# Patient Record
Sex: Male | Born: 1960 | Race: Black or African American | Hispanic: No | Marital: Single | Smoking: Former smoker
Health system: Southern US, Community
[De-identification: ages and names within clinical notes are randomized; demographics above are authoritative.]

## PROBLEM LIST (undated history)

## (undated) DIAGNOSIS — E119 Type 2 diabetes mellitus without complications: Secondary | ICD-10-CM

## (undated) DIAGNOSIS — M1990 Unspecified osteoarthritis, unspecified site: Secondary | ICD-10-CM

## (undated) DIAGNOSIS — E669 Obesity, unspecified: Secondary | ICD-10-CM

## (undated) DIAGNOSIS — T7840XA Allergy, unspecified, initial encounter: Secondary | ICD-10-CM

## (undated) DIAGNOSIS — K635 Polyp of colon: Secondary | ICD-10-CM

## (undated) DIAGNOSIS — D649 Anemia, unspecified: Secondary | ICD-10-CM

## (undated) DIAGNOSIS — I10 Essential (primary) hypertension: Secondary | ICD-10-CM

## (undated) DIAGNOSIS — J45909 Unspecified asthma, uncomplicated: Secondary | ICD-10-CM

## (undated) HISTORY — PX: KNEE ARTHROSCOPY: SUR90

## (undated) HISTORY — DX: Allergy, unspecified, initial encounter: T78.40XA

## (undated) HISTORY — DX: Obesity, unspecified: E66.9

## (undated) HISTORY — DX: Anemia, unspecified: D64.9

## (undated) HISTORY — DX: Unspecified osteoarthritis, unspecified site: M19.90

## (undated) HISTORY — DX: Unspecified asthma, uncomplicated: J45.909

## (undated) HISTORY — DX: Polyp of colon: K63.5

---

## 1998-08-26 ENCOUNTER — Encounter: Admission: RE | Admit: 1998-08-26 | Discharge: 1998-08-26 | Payer: Self-pay | Admitting: *Deleted

## 2004-05-19 ENCOUNTER — Encounter: Admission: RE | Admit: 2004-05-19 | Discharge: 2004-05-19 | Payer: Self-pay | Admitting: Family Medicine

## 2011-12-31 ENCOUNTER — Ambulatory Visit (INDEPENDENT_AMBULATORY_CARE_PROVIDER_SITE_OTHER): Payer: Managed Care, Other (non HMO) | Admitting: Internal Medicine

## 2011-12-31 ENCOUNTER — Ambulatory Visit: Payer: Managed Care, Other (non HMO)

## 2011-12-31 ENCOUNTER — Telehealth: Payer: Self-pay | Admitting: Internal Medicine

## 2011-12-31 VITALS — BP 167/107 | HR 75 | Temp 98.1°F | Resp 16 | Ht 68.0 in | Wt 207.4 lb

## 2011-12-31 DIAGNOSIS — M79646 Pain in unspecified finger(s): Secondary | ICD-10-CM

## 2011-12-31 DIAGNOSIS — M25562 Pain in left knee: Secondary | ICD-10-CM

## 2011-12-31 DIAGNOSIS — I10 Essential (primary) hypertension: Secondary | ICD-10-CM

## 2011-12-31 DIAGNOSIS — M79609 Pain in unspecified limb: Secondary | ICD-10-CM

## 2011-12-31 DIAGNOSIS — M25569 Pain in unspecified knee: Secondary | ICD-10-CM

## 2011-12-31 MED ORDER — HYDROCHLOROTHIAZIDE 12.5 MG PO CAPS: 12.5000 mg | ORAL_CAPSULE | Freq: Every day | ORAL | Status: DC

## 2011-12-31 MED ORDER — MELOXICAM 15 MG PO TABS: 15.0000 mg | ORAL_TABLET | Freq: Every day | ORAL | Status: DC

## 2011-12-31 MED ORDER — MELOXICAM 15 MG PO TABS: 15.0000 mg | ORAL_TABLET | Freq: Every day | ORAL | Status: AC

## 2011-12-31 NOTE — Telephone Encounter (Signed)
What is his phone #?
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
## 2012-01-05 ENCOUNTER — Telehealth: Payer: Self-pay

## 2012-01-05 NOTE — Telephone Encounter (Signed)
X-rays copied on a disc for pick up. Patient notified.

## 2012-01-05 NOTE — Telephone Encounter (Signed)
Pt needs copy of x-rays on cd to take to his referral appt on 01-07-12 @ 3:30 please contact pt when ready for pick-up.Marland Kitchen

## 2012-02-01 ENCOUNTER — Encounter: Payer: Managed Care, Other (non HMO) | Admitting: Family Medicine

## 2013-03-11 ENCOUNTER — Ambulatory Visit (INDEPENDENT_AMBULATORY_CARE_PROVIDER_SITE_OTHER): Payer: Managed Care, Other (non HMO) | Admitting: Physician Assistant

## 2013-03-11 VITALS — BP 142/86 | HR 87 | Temp 98.7°F | Resp 18 | Ht 67.0 in | Wt 205.0 lb

## 2013-03-11 DIAGNOSIS — R21 Rash and other nonspecific skin eruption: Secondary | ICD-10-CM

## 2013-03-11 DIAGNOSIS — L282 Other prurigo: Secondary | ICD-10-CM

## 2013-03-11 DIAGNOSIS — Z131 Encounter for screening for diabetes mellitus: Secondary | ICD-10-CM

## 2013-03-11 LAB — GLUCOSE, POCT (MANUAL RESULT ENTRY): POC Glucose: 90 mg/dl (ref 70–99)

## 2013-03-11 MED ORDER — PREDNISONE 20 MG PO TABS: ORAL_TABLET | ORAL | Status: DC

## 2013-03-11 NOTE — Patient Instructions (Signed)
Continue OTC hydrocortisone cream twice daily to affected area Recommend Zyrtec daily in the morning and Benadryl 25-50 mg at bedtime Start prednisone taper - take in a.m.  Follow up if symptoms worsen or fail to improve.

## 2013-03-11 NOTE — Progress Notes (Signed)
  Subjective:    Patient ID: Craig House, male    DOB: 17-Jan-1961, 52 y.o.   MRN: [id number]  HPI 52 year old male presents with 1 week history of pruritic rash on face and back of neck.  He has also noticed the appearance of several small dots on his forearms and hands.  Denies any recent yard work or known exposure to poison ivy.  Was in a hotel last week and used a different soap.  No SOB, lip/tongue swelling, pain, or headache.  Admits it is extremely pruritic and has started weeping.  He has used calamine lotion and hydrocortisone cream which have helped some.  No antihistamines tried yet.  Does have a dog that could have gotten into poison ivy and spread to him.       Review of Systems  Constitutional: Negative for fever and chills.  Eyes: Negative for photophobia, pain and visual disturbance.  Gastrointestinal: Negative for nausea and vomiting.  Musculoskeletal: Negative for myalgias.  Skin: Positive for rash.  Neurological: Negative for dizziness and headaches.       Objective:   Physical Exam  Constitutional: He is oriented to person, place, and time. He appears well-developed and well-nourished.  HENT:  Head: Normocephalic and atraumatic.  Right Ear: External ear normal.  Left Ear: External ear normal.  Mouth/Throat: Oropharynx is clear and moist.  Eyes: Conjunctivae are normal.  Neck: Normal range of motion.  Cardiovascular: Normal rate.   Pulmonary/Chest: Effort normal.  Neurological: He is alert and oriented to person, place, and time.  Skin:     Psychiatric: He has a normal mood and affect. His behavior is normal. Judgment and thought content normal.          Assessment & Plan:  Rash and other nonspecific skin eruption - Plan: predniSONE (DELTASONE) 20 MG tablet  Pruritic rash - Plan: predniSONE (DELTASONE) 20 MG tablet  Screening for diabetes mellitus - Plan: POCT glucose (manual entry)  Patient Instructions  Continue OTC hydrocortisone cream  twice daily to affected area Recommend Zyrtec daily in the morning and Benadryl 25-50 mg at bedtime Start prednisone taper - take in a.m.  Follow up if symptoms worsen or fail to improve.

## 2013-03-14 ENCOUNTER — Telehealth: Payer: Self-pay

## 2013-03-14 DIAGNOSIS — I10 Essential (primary) hypertension: Secondary | ICD-10-CM

## 2013-03-14 MED ORDER — HYDROCHLOROTHIAZIDE 12.5 MG PO CAPS: 12.5000 mg | ORAL_CAPSULE | Freq: Every day | ORAL | Status: DC

## 2013-03-14 NOTE — Telephone Encounter (Signed)
I have sent a 30 day supply, pt needs CPE, labs

## 2013-03-14 NOTE — Telephone Encounter (Signed)
Pt is out of bp rx - seen here recently for an allergic rxn and states he asked for a refill but wasn't given one. Pt is out. Please notify ok to refill or not.   Pharmacy: [facility] [address]  [place]

## 2013-03-15 NOTE — Telephone Encounter (Signed)
Called him to advise follow up needed

## 2013-08-01 ENCOUNTER — Ambulatory Visit (INDEPENDENT_AMBULATORY_CARE_PROVIDER_SITE_OTHER): Payer: Managed Care, Other (non HMO) | Admitting: Emergency Medicine

## 2013-08-01 VITALS — BP 150/90 | HR 76 | Temp 98.2°F | Resp 16 | Ht 67.0 in | Wt 219.0 lb

## 2013-08-01 DIAGNOSIS — I10 Essential (primary) hypertension: Secondary | ICD-10-CM

## 2013-08-01 MED ORDER — TRIAMTERENE-HCTZ 37.5-25 MG PO CAPS: 1.0000 | ORAL_CAPSULE | ORAL | Status: DC

## 2013-08-01 NOTE — Progress Notes (Signed)
[facility] [address], [place] [address] [phone]  Date:  08/01/2013   Name:  Craig House   DOB:  [birth year]   MRN:  [id number]  PCP:  No PCP Per Patient    Chief Complaint: Medication Refill   History of Present Illness:  Djuan Talton is a 52 y.o. very pleasant male patient who presents with the following:  History of hypertension and is out of his antihypertensive.  Requests refill and a potassium check.  Requests "diet pills". No improvement with over the counter medications or other home remedies. Denies other complaint or health concern today.   Patient Active Problem List   Diagnosis Date Noted  . HTN (hypertension) 12/31/2011    Past Medical History  Diagnosis Date  . Asthma     Past Surgical History  Procedure Laterality Date  . Knee surgery      History  Substance Use Topics  . Smoking status: Former Games developer  . Smokeless tobacco: Not on file  . Alcohol Use: Not on file    Family History  Problem Relation Age of Onset  . Heart attack Mother   . Cancer Father     No Known Allergies  Medication list has been reviewed and updated.  Current Outpatient Prescriptions on File Prior to Visit  Medication Sig Dispense Refill  . hydrochlorothiazide (MICROZIDE) 12.5 MG capsule Take 1 capsule (12.5 mg total) by mouth daily.  30 capsule  0   No current facility-administered medications on file prior to visit.    Review of Systems:  As per HPI, otherwise negative.    Physical Examination: Filed Vitals:   08/01/13 1836  BP: 150/90  Pulse: 76  Temp: 98.2 F (36.8 C)  Resp: 16   Filed Vitals:   08/01/13 1836  Height: 5\' 7"  (1.702 m)  Weight: 219 lb (99.338 kg)   Body mass index is 34.29 kg/(m^2). Ideal Body Weight: Weight in (lb) to have BMI = 25: 159.3  GEN: WDWN, NAD, Non-toxic, A & O x 3 HEENT: Atraumatic, Normocephalic. Neck supple. No masses, No LAD. Ears and Nose: No external deformity. CV: RRR,  No M/G/R. No JVD. No thrill. No extra heart sounds. PULM: CTA B, no wheezes, crackles, rhonchi. No retractions. No resp. distress. No accessory muscle use. ABD: S, NT, ND, +BS. No rebound. No HSM. EXTR: No c/c/e NEURO Normal gait.  PSYCH: Normally interactive. Conversant. Not depressed or anxious appearing.  Calm demeanor.    Assessment and Plan: Hypertension Overweight Concerned about potassium level Will change to dyazide and follow up in one month.   Signed,  Phillips Odor, MD

## 2013-08-01 NOTE — Patient Instructions (Addendum)

## 2013-08-06 ENCOUNTER — Ambulatory Visit: Payer: Managed Care, Other (non HMO)

## 2013-08-06 ENCOUNTER — Emergency Department (HOSPITAL_COMMUNITY)
Admission: EM | Admit: 2013-08-06 | Discharge: 2013-08-06 | Disposition: A | Discharge status: Home / Self Care | Payer: Managed Care, Other (non HMO) | Attending: Emergency Medicine | Admitting: Emergency Medicine

## 2013-08-06 ENCOUNTER — Encounter (HOSPITAL_COMMUNITY): Payer: Self-pay | Admitting: *Deleted

## 2013-08-06 ENCOUNTER — Ambulatory Visit (INDEPENDENT_AMBULATORY_CARE_PROVIDER_SITE_OTHER): Payer: Managed Care, Other (non HMO) | Admitting: Internal Medicine

## 2013-08-06 ENCOUNTER — Emergency Department (HOSPITAL_COMMUNITY): Payer: Managed Care, Other (non HMO)

## 2013-08-06 VITALS — BP 116/78 | HR 104 | Temp 98.3°F | Resp 20 | Ht 67.5 in | Wt 207.0 lb

## 2013-08-06 DIAGNOSIS — T50905A Adverse effect of unspecified drugs, medicaments and biological substances, initial encounter: Secondary | ICD-10-CM

## 2013-08-06 DIAGNOSIS — E785 Hyperlipidemia, unspecified: Secondary | ICD-10-CM

## 2013-08-06 DIAGNOSIS — Z79899 Other long term (current) drug therapy: Secondary | ICD-10-CM | POA: Insufficient documentation

## 2013-08-06 DIAGNOSIS — R5381 Other malaise: Secondary | ICD-10-CM

## 2013-08-06 DIAGNOSIS — R5383 Other fatigue: Secondary | ICD-10-CM

## 2013-08-06 DIAGNOSIS — I248 Other forms of acute ischemic heart disease: Secondary | ICD-10-CM

## 2013-08-06 DIAGNOSIS — I2489 Other forms of acute ischemic heart disease: Secondary | ICD-10-CM

## 2013-08-06 DIAGNOSIS — J029 Acute pharyngitis, unspecified: Secondary | ICD-10-CM | POA: Insufficient documentation

## 2013-08-06 DIAGNOSIS — R002 Palpitations: Secondary | ICD-10-CM

## 2013-08-06 DIAGNOSIS — Z87891 Personal history of nicotine dependence: Secondary | ICD-10-CM | POA: Insufficient documentation

## 2013-08-06 DIAGNOSIS — I10 Essential (primary) hypertension: Secondary | ICD-10-CM

## 2013-08-06 DIAGNOSIS — R110 Nausea: Secondary | ICD-10-CM | POA: Insufficient documentation

## 2013-08-06 DIAGNOSIS — J45909 Unspecified asthma, uncomplicated: Secondary | ICD-10-CM | POA: Insufficient documentation

## 2013-08-06 DIAGNOSIS — R42 Dizziness and giddiness: Secondary | ICD-10-CM | POA: Insufficient documentation

## 2013-08-06 HISTORY — DX: Essential (primary) hypertension: I10

## 2013-08-06 LAB — BASIC METABOLIC PANEL
BUN: 19 mg/dL (ref 6–23)
BUN: 16 mg/dL (ref 6–23)
Creatinine, Ser: 1.43 mg/dL — ABNORMAL HIGH (ref 0.50–1.35)
Creatinine, Ser: 1.21 mg/dL (ref 0.50–1.35)
GFR calc Af Amer: 63 mL/min — ABNORMAL LOW (ref >90)
GFR calc Af Amer: 77 mL/min — ABNORMAL LOW (ref >90)
GFR calc non Af Amer: 67 mL/min — ABNORMAL LOW (ref >90)
Glucose, Bld: 192 mg/dL — ABNORMAL HIGH (ref 70–99)
Glucose, Bld: 166 mg/dL — ABNORMAL HIGH (ref 70–99)
Potassium: 5 mEq/L (ref 3.5–5.1)

## 2013-08-06 LAB — POCT UA - MICROSCOPIC ONLY: Yeast, UA: NEGATIVE

## 2013-08-06 LAB — POCT I-STAT, CHEM 8
BUN: 16 mg/dL (ref 6–23)
Calcium, Ion: 1.08 mmol/L — ABNORMAL LOW (ref 1.12–1.23)
Chloride: 98 meq/L (ref 96–112)
Creatinine, Ser: 1.3 mg/dL (ref 0.50–1.35)
Glucose, Bld: 164 mg/dL — ABNORMAL HIGH (ref 70–99)
HCT: 51 % (ref 39.0–52.0)
Hemoglobin: 17.3 g/dL — ABNORMAL HIGH (ref 13.0–17.0)
Potassium: 4 meq/L (ref 3.5–5.1)
Sodium: 136 mEq/L (ref 135–145)
TCO2: 27 mmol/L (ref 0–100)

## 2013-08-06 LAB — CBC WITH DIFFERENTIAL/PLATELET
Basophils Absolute: 0 10*3/uL (ref 0.0–0.1)
Basophils Relative: 0 % (ref 0–1)
Eosinophils Absolute: 0.1 10*3/uL (ref 0.0–0.7)
Eosinophils Relative: 1 % (ref 0–5)
HCT: 47.4 % (ref 39.0–52.0)
Hemoglobin: 16.9 g/dL (ref 13.0–17.0)
Lymphocytes Relative: 31 % (ref 12–46)
Lymphs Abs: 3.7 K/uL (ref 0.7–4.0)
MCH: 32.8 pg (ref 26.0–34.0)
MCHC: 35.7 g/dL (ref 30.0–36.0)
MCV: 91.9 fL (ref 78.0–100.0)
Monocytes Absolute: 1.1 10*3/uL — ABNORMAL HIGH (ref 0.1–1.0)
Monocytes Relative: 9 % (ref 3–12)
Neutro Abs: 6.9 10*3/uL (ref 1.7–7.7)
Neutrophils Relative %: 59 % (ref 43–77)
Platelets: 285 K/uL (ref 150–400)
RBC: 5.16 MIL/uL (ref 4.22–5.81)
RDW: 12.6 % (ref 11.5–15.5)
WBC: 11.8 10*3/uL — ABNORMAL HIGH (ref 4.0–10.5)

## 2013-08-06 LAB — POCT URINALYSIS DIPSTICK
Blood, UA: NEGATIVE
Glucose, UA: NEGATIVE
Ketones, UA: NEGATIVE
Spec Grav, UA: >=1.03
Urobilinogen, UA: 1

## 2013-08-06 LAB — URINALYSIS, ROUTINE W REFLEX MICROSCOPIC
Bilirubin Urine: NEGATIVE
Glucose, UA: NEGATIVE mg/dL
Ketones, ur: NEGATIVE mg/dL
Leukocytes, UA: NEGATIVE
Protein, ur: NEGATIVE mg/dL
Specific Gravity, Urine: 1.021 (ref 1.005–1.030)
Urobilinogen, UA: 1 mg/dL (ref 0.0–1.0)
pH: 5.5 (ref 5.0–8.0)

## 2013-08-06 LAB — POCT I-STAT TROPONIN I
Troponin i, poc: 0.01 ng/mL (ref 0.00–0.08)
Troponin i, poc: 0.02 ng/mL (ref 0.00–0.08)

## 2013-08-06 MED ORDER — ONDANSETRON HCL 4 MG/2ML IJ SOLN: 4.0000 mg | Freq: Once | INTRAMUSCULAR | Status: DC

## 2013-08-06 MED ORDER — SODIUM CHLORIDE 0.9 % IV BOLUS (SEPSIS)
1000.0000 mL | Freq: Once | INTRAVENOUS | Status: AC
  Administered 2013-08-06: 1000 mL via INTRAVENOUS

## 2013-08-06 NOTE — ED Notes (Signed)
Given urinal and explained a specimen would be needed. Verbalized understanding.

## 2013-08-06 NOTE — ED Provider Notes (Signed)
CSN: [id number]     Arrival date & time 08/06/13  1314 History   First MD Initiated Contact with Patient 08/06/13 1317     Chief Complaint  Patient presents with  . Weakness   (Consider location/radiation/quality/duration/timing/severity/associated sxs/prior Treatment) HPI 52 year old male with a past medical history of hypertension and asthma that presents after transfer from his primary care doctor's office for further evaluation of possible atypical ACS symptoms.  The patient reports 2 days of malaise, fatigue, nausea, lightheadedness and overall not feeling well. He also had 2 episodes of "sweating" yesterday. He was seen at his doctor's office today and had new T wave inversions on his EKG in the setting of the vague symptoms so he was referred to the emergency department. He has never had any chest pain during this episode. He denies cough, fevers, vomiting, abdominal pain, diarrhea, weight loss, urinary symptoms. No known sick contacts or travel.  His mother reportedly died from a "cardiac arrest" but he is unsure of the cause.  Past Medical History  Diagnosis Date  . Asthma   . Hypertension    Past Surgical History  Procedure Laterality Date  . Knee surgery     Family History  Problem Relation Age of Onset  . Heart attack Mother   . Cancer Father    History  Substance Use Topics  . Smoking status: Former Games developer  . Smokeless tobacco: Not on file  . Alcohol Use: 0.5 oz/week    1 drink(s) per week    Review of Systems  Constitutional: Negative for fever, chills and unexpected weight change.  HENT: Positive for sore throat. Negative for congestion, trouble swallowing, neck stiffness and voice change.   Eyes: Negative for pain.  Respiratory: Negative for cough, chest tightness and shortness of breath.   Cardiovascular: Negative for chest pain, palpitations and leg swelling.  Endocrine: Negative for polydipsia and polyphagia.  Genitourinary: Negative for dysuria and  frequency.  Musculoskeletal: Negative for back pain, joint swelling, arthralgias and gait problem.  Skin: Negative for pallor and rash.  Neurological: Negative for syncope, weakness and headaches.  Hematological: Negative for adenopathy. Does not bruise/bleed easily.  All other systems reviewed and are negative.    Allergies  Review of patient's allergies indicates no known allergies.  Home Medications   Current Outpatient Rx  Name  Route  Sig  Dispense  Refill  . naproxen sodium (ANAPROX) 220 MG tablet   Oral   Take 440 mg by mouth 2 (two) times daily as needed.         . triamterene-hydrochlorothiazide (DYAZIDE) 37.5-25 MG per capsule   Oral   Take 1 each (1 capsule total) by mouth every morning.   30 capsule   3    BP 140/100  Pulse 101  Temp(Src) 98.6 F (37 C) (Oral)  Resp 18  SpO2 98% Physical Exam  Vitals reviewed. Constitutional: He is oriented to person, place, and time. He appears well-developed and well-nourished. No distress.  HENT:  Head: Normocephalic.  Right Ear: External ear normal.  Left Ear: External ear normal.  Nose: Nose normal.  Mouth/Throat: Oropharynx is clear and moist. No oropharyngeal exudate.  Eyes: Conjunctivae and EOM are normal. Pupils are equal, round, and reactive to light.  Neck: Normal range of motion. Neck supple.  Cardiovascular: Regular rhythm, normal heart sounds and intact distal pulses.  Exam reveals no gallop and no friction rub.   No murmur heard. Borderline tachycardia  Pulmonary/Chest: Effort normal and breath sounds normal.  Abdominal: Soft. Bowel sounds are normal. He exhibits no distension. There is no tenderness.  Musculoskeletal: Normal range of motion. He exhibits no edema and no tenderness.  Neurological: He is alert and oriented to person, place, and time.  Skin: Skin is warm and dry. He is not diaphoretic.  Psychiatric: He has a normal mood and affect.    ED Course  Procedures (including critical care  time) Labs Review Labs Reviewed  CBC WITH DIFFERENTIAL - Abnormal; Notable for the following:    WBC 11.8 (*)    Monocytes Absolute 1.1 (*)    All other components within normal limits  BASIC METABOLIC PANEL - Abnormal; Notable for the following:    Sodium 129 (*)    Chloride 93 (*)    Glucose, Bld 192 (*)    Creatinine, Ser 1.43 (*)    GFR calc non Af Amer 55 (*)    GFR calc Af Amer 63 (*)    All other components within normal limits  BASIC METABOLIC PANEL - Abnormal; Notable for the following:    Glucose, Bld 166 (*)    Calcium 8.3 (*)    GFR calc non Af Amer 67 (*)    GFR calc Af Amer 77 (*)    All other components within normal limits  POCT I-STAT, CHEM 8 - Abnormal; Notable for the following:    Glucose, Bld 164 (*)    Calcium, Ion 1.08 (*)    Hemoglobin 17.3 (*)    All other components within normal limits  URINALYSIS, ROUTINE W REFLEX MICROSCOPIC  TROPONIN I  POCT I-STAT TROPONIN I  POCT I-STAT TROPONIN I   Imaging Review Dg Chest 2 View  08/06/2013   CLINICAL DATA:  Nausea, weakness  EXAM: CHEST  2 VIEW  COMPARISON:  None.  FINDINGS: There is no focal infiltrate, pulmonary edema, or pleural effusion. The mediastinal contour and cardiac silhouette are normal. The soft tissues and osseous structures are normal.  IMPRESSION: No active cardiopulmonary disease.   Electronically Signed   By: Sherian Rein   On: 08/06/2013 17:17     Date: 08/06/2013  Rate: 101  Rhythm: sinus tachycardia  QRS Axis: normal  Intervals: normal  ST/T Wave abnormalities: T wave inversions in lead 1, lead 2, V5 and V6  Conduction Disutrbances:none  Narrative Interpretation: Sinus tach, NSTWA, otherwise normal  Old EKG Reviewed: New T wave inversions in V5 and V6 compared to his EKG from 04/06/2005, rate has increased by 27 beats, otherwise no change     MDM   52 year old male with pertinent history of hypertension and asthma here with 2 days of vague constitutional symptoms. No chest pain.  His EKG today shows new T wave inversions compared to his EKG from 2006.  Borderline tachycardia, afebrile, well-appearing, reassuring exam.  Differential diagnosis: Medication reaction, viral illness, ACS, pneumonia, UTI.  Doubt ACS as sx are very atypical.  HEART score is 3 (1 for EKG, 1 for age, 1 for risk fx).  Will plan for delta trop strategy.  3:00 PM Patient care signed out to Dr. Modesto Charon with labs and workup pending.  Please refer to his note for further details of the visit. Plan at transfer of care is for symptom relief and delta troponin would likely discharge and close outpatient followup.  Clinical Impression: 1. Malaise and fatigue   2. Nausea     Pt seen in conjunction with Dr. Oletta Lamas.  Reine Just. Beverely Pace, MD Emergency Medicine PGY-III [phone]    Baird Lyons  Beverely Pace, MD 08/07/13 [phone]

## 2013-08-06 NOTE — Patient Instructions (Addendum)
Acute Coronary Syndrome °Acute coronary syndrome (ACS) is an urgent problem in which the blood and oxygen supply to the heart is critically deficient. ACS requires hospitalization because one or more coronary arteries may be blocked. °ACS represents a range of conditions including: °· Previous angina that is now unstable, lasts longer, happens at rest, or is more intense. °· A heart attack, with heart muscle cell injury and death. °There are three vital coronary arteries that supply the heart muscle with blood and oxygen so that it can pump blood effectively. If blockages to these arteries develop, blood flow to the heart muscle is reduced. If the heart does not get enough blood, angina may occur as the first warning sign. °SYMPTOMS  °· The most common signs of angina include: °· Tightness or squeezing in the chest. °· Feeling of heaviness on the chest. °· Discomfort in the arms, neck, or jaw. °· Shortness of breath and nausea. °· Cold, wet skin. °· Angina is usually brought on by physical effort or excitement which increase the oxygen needs of the heart. These states increase the blood flow needs of the heart beyond what can be delivered. °TREATMENT  °· Medicines to help discomfort may include nitroglycerin (nitro) in the form of tablets or a spray for rapid relief, or longer-acting forms such as cream, patches, or capsules. (Be aware that there are many side effects and possible interactions with other drugs). °· Other medicines may be used to help the heart pump better. °· Procedures to open blocked arteries including angioplasty or stent placement to keep the arteries open. °· Open heart surgery may be needed when there are many blockages or they are in critical locations that are best treated with surgery. °HOME CARE INSTRUCTIONS  °· Avoid smoking. °· Take one baby or adult aspirin daily, if your caregiver advises. This helps reduce the risk of a heart attack. °· It is very important that you follow the angina  treatment prescribed by your caregiver. Make arrangements for proper follow-up care. °· Eat a heart healthy diet with salt and fat restrictions as advised. °· Regular exercise is good for you as long as it does not cause discomfort. Do not begin any new type of exercise until you check with your caregiver. °· If you are overweight, you should lose weight. °· Try to maintain normal blood lipid levels. °· Keep your blood pressure under control as recommended by your caregiver. °· You should tell your caregiver right away about any increase in the severity or frequency of your chest discomfort or angina attacks. When you have angina, you should stop what you are doing and sit down. This may bring relief in 3 to 5 minutes. If your caregiver has prescribed nitro, take it as directed. °· If your caregiver has given you a follow-up appointment, it is very important to keep that appointment. Not keeping the appointment could result in a chronic or permanent injury, pain, and disability. If there is any problem keeping the appointment, you must call back to this facility for assistance. °SEEK IMMEDIATE MEDICAL CARE IF:  °· You develop nausea, vomiting, or shortness of breath. °· You feel faint, lightheaded, or pass out. °· Your chest discomfort gets worse. °· You are sweating or experience sudden profound fatigue. °· You do not get relief of your chest pain after 3 doses of nitro. °· Your discomfort lasts longer than 15 minutes. °MAKE SURE YOU:  °· Understand these instructions. °· Will watch your condition. °· Will get help   right away if you are not doing well or get worse. °Document Released: 10/31/2005 Document Revised: 01/23/2012 Document Reviewed: 06/03/2008 °Exit[employer]® Patient Information ©2014 [employer]. ° °

## 2013-08-06 NOTE — ED Notes (Signed)
Pt requesting IV be removed.  Also st's no relief from previous pain med.

## 2013-08-06 NOTE — ED Provider Notes (Signed)
Assumed Care from Dr. Beverely Pace please refer to his note for HPI and MDM up until care transfer  3:20 PM Pt is a 52 y.o. male with pertinent PMHX of HTN, asthma who presents to the ED with 2 days lighheadedness, fatigue, diaphoresis. No chest pain. Recently started new BP med. Sent by PCP to rule out MI. Low risk for PE. No recent fevers or illness.  EKG today showed new T wave inversions in V5 and V6. Plan for delta troponin with follow up with PCP for possible stress test. HEART score 3.  Review of Labs: CBC: leukocytosis, H&H 16.9/47.4 BMP: hyponatremia, hypochloremia. Cr 1.43 likely related to diuretic UA:L negative for UTI First Troponin: negative  Will bolus with fluids and recheck BMP. Plan to check delta troponin in 3 hours (5:30PM). CXR PA/LAT for nausea and weakness per my read showed no ptx, no cardiomegaly, no pna  EKG personally reviewed by myself showed sinus tachycardia, flipped Ts in the V1, V5,V6 Rate of101, PR , QRS 75ms QT/QTC 328/463ms, normal axis, without evidence of new ischemia. Comparison showed similar, without new findings of t wave inversions in V5,V6, indication: nausea, weakness  Second troponin negative. Electrolytes improved after boluses. Pt feels subjectively better. Pt does not feel comfortable taking Triamterene HCTZ. Pt would like a different BP medication. Instructed pt to follow up with PCP tomorrow for change of medication.   7:36 PM:  I have discussed the diagnosis/risks/treatment options with the patient and family and believe the pt to be eligible for discharge home to follow-up with PCP tomorrow. We also discussed returning to the ED immediately if new or worsening sx occur. We discussed the sx which are most concerning (e.g., worsening symptoms) that necessitate immediate return. Any new prescriptions provided to the patient are listed below.   New Prescriptions   No medications on file    The patient appears reasonably screened and/or stabilized  for discharge and I doubt any other medical condition or other [facility] requiring further screening, evaluation or treatment in the ED at this time prior to discharge . Pt in agreement with discharge plan. Return precautions given. Pt discharged VSS   Labs, EKG and imaging reviewed by myself and considered in medical decision making if ordered.  Imaging interpreted by radiology. Pt was discussed with my attending, Dr. Oletta Lamas   Clinical Impression: 1. Weakness 2. Nausea   Fredirick Lathe, MD 08/07/13 [phone]

## 2013-08-06 NOTE — ED Notes (Addendum)
Patient brought to ED for weakness.  It started yesterday when he started experiencing weakness and diaphoresis.  Patient went to Urgent Care today and there was slightly EKG changes so transferred to ED.  Patient was tachycardic in about 120s.  Last BP 133/81.  Patient has had 1 nitro from [facility] and 324mg  of aspirin PTA.  Patient is pain free at this time. Patient started taking Dyazide on Friday so he thought he was having an adverse reaction.  Patient denies any chest pain,

## 2013-08-06 NOTE — Progress Notes (Signed)
  Subjective:    Patient ID: Nollan Muldrow, male    DOB: 08-01-1961, 52 y.o.   MRN: [id number]  HPI Having side affects with new BP med Dyazide. Is itching and nausea, has improved since stopped med yesterday. Feels palpitations but no CP,SOB. No rash seen. Last CPE 2009 by me, cholesterol was elevated and BP and was on lisinopril then. Stat EKG ST-twave changes suggestive of ischemia Further hx he sweated and felt fatigue since yesterday  R/o MI  Start oxygen/ASA po/IV Call EMT/cardiology   Review of Systems     Objective:   Physical Exam  Vitals reviewed. Constitutional: He is oriented to person, place, and time. He appears well-developed and well-nourished. He appears distressed.  Eyes: EOM are normal.  Cardiovascular: Regular rhythm.   No extrasystoles are present. Tachycardia present.  Exam reveals gallop.   Pulmonary/Chest: Effort normal and breath sounds normal.  Abdominal: Soft.  Neurological: He is alert and oriented to person, place, and time. No cranial nerve deficit. Coordination normal.  Skin: No rash noted.  Psychiatric: He has a normal mood and affect. His behavior is normal.          Assessment & Plan:  R/O MI EMTs to [facility] heart emergent

## 2013-08-07 ENCOUNTER — Encounter: Payer: Self-pay | Admitting: Internal Medicine

## 2013-08-08 NOTE — ED Provider Notes (Signed)
I saw and evaluated the patient, reviewed the resident's note and I agree with the findings and plan.  I reviewed the ECG and agree with ECG interpretation by Dr. Beverely Pace.  PT with vague symptoms of achiness, fatigue, throat fullness and "scratchy" throat.  Seen by PCP and sent to the ED due to some ECG abn's and pt also with strong family h/o CAD.  Pt has no CP, SOB, nausea.  ECG with more T wave inversion compared to ECG from 8 years prior.  AGain, no CP, will obtain delta troponin.  HR normal, lungs clear.  Abd soft.  Pt simply tired and fatigued.  No fever. Pt was begun on new HTN medication 4 days prior.  I suspect either pt is still adjusting to new med, or possibly early viral syndrome.  If serial troponins are neg, pt is safe for discharge to follow up with PCP and can follow up with cardiologist as needed.    Gavin Pound. Oletta Lamas, MD 08/08/13 [phone]

## 2013-09-19 ENCOUNTER — Other Ambulatory Visit: Payer: Self-pay

## 2013-12-26 ENCOUNTER — Encounter: Payer: Self-pay | Admitting: Internal Medicine

## 2013-12-30 ENCOUNTER — Ambulatory Visit (AMBULATORY_SURGERY_CENTER): Payer: Self-pay | Admitting: *Deleted

## 2013-12-30 VITALS — Ht 67.0 in | Wt 211.0 lb

## 2013-12-30 DIAGNOSIS — Z1211 Encounter for screening for malignant neoplasm of colon: Secondary | ICD-10-CM

## 2013-12-30 MED ORDER — MOVIPREP 100 G PO SOLR: ORAL | Status: DC

## 2013-12-30 NOTE — Progress Notes (Signed)
Patient denies any allergies to eggs or soy. Patient denies any problems with anesthesia.  

## 2013-12-31 ENCOUNTER — Encounter: Payer: Self-pay | Admitting: Internal Medicine

## 2014-01-13 ENCOUNTER — Encounter: Payer: Managed Care, Other (non HMO) | Admitting: Internal Medicine

## 2014-01-20 ENCOUNTER — Encounter: Payer: Self-pay | Admitting: Internal Medicine

## 2014-01-20 ENCOUNTER — Ambulatory Visit (AMBULATORY_SURGERY_CENTER): Payer: BC Managed Care – PPO | Admitting: Internal Medicine

## 2014-01-20 VITALS — BP 124/75 | HR 79 | Temp 98.2°F | Resp 14 | Ht 67.0 in | Wt 211.0 lb

## 2014-01-20 DIAGNOSIS — D126 Benign neoplasm of colon, unspecified: Secondary | ICD-10-CM

## 2014-01-20 DIAGNOSIS — Z1211 Encounter for screening for malignant neoplasm of colon: Secondary | ICD-10-CM

## 2014-01-20 MED ORDER — SODIUM CHLORIDE 0.9 % IV SOLN: 500.0000 mL | INTRAVENOUS | Status: DC

## 2014-01-20 NOTE — Progress Notes (Signed)
Report to pacu rn, vss, bbs=clear 

## 2014-01-20 NOTE — Patient Instructions (Signed)
YOU HAD AN ENDOSCOPIC PROCEDURE TODAY AT THE [facility]: Refer to the procedure report that was given to you for any specific questions about what was found during the examination.  If the procedure report does not answer your questions, please call your gastroenterologist to clarify.  If you requested that your care partner not be given the details of your procedure findings, then the procedure report has been included in a sealed envelope for you to review at your convenience later.  YOU SHOULD EXPECT: Some feelings of bloating in the abdomen. Passage of more gas than usual.  Walking can help get rid of the air that was put into your GI tract during the procedure and reduce the bloating. If you had a lower endoscopy (such as a colonoscopy or flexible sigmoidoscopy) you may notice spotting of blood in your stool or on the toilet paper. If you underwent a bowel prep for your procedure, then you may not have a normal bowel movement for a few days.  DIET: Your first meal following the procedure should be a light meal and then it is ok to progress to your normal diet.  A half-sandwich or bowl of soup is an example of a good first meal.  Heavy or fried foods are harder to digest and may make you feel nauseous or bloated.  Likewise meals heavy in dairy and vegetables can cause extra gas to form and this can also increase the bloating.  Drink plenty of fluids but you should avoid alcoholic beverages for 24 hours.  ACTIVITY: Your care partner should take you home directly after the procedure.  You should plan to take it easy, moving slowly for the rest of the day.  You can resume normal activity the day after the procedure however you should NOT DRIVE or use heavy machinery for 24 hours (because of the sedation medicines used during the test).    SYMPTOMS TO REPORT IMMEDIATELY: A gastroenterologist can be reached at any hour.  During normal business hours, 8:30 AM to 5:00 PM Monday through Friday,  call [phone].  After hours and on weekends, please call the GI answering service at [phone] who will take a message and have the physician on call contact you.   Following lower endoscopy (colonoscopy or flexible sigmoidoscopy):  Excessive amounts of blood in the stool  Significant tenderness or worsening of abdominal pains  Swelling of the abdomen that is new, acute  Fever of 100F or higher   FOLLOW UP: If any biopsies were taken you will be contacted by phone or by letter within the next 1-3 weeks.  Call your gastroenterologist if you have not heard about the biopsies in 3 weeks.  Our staff will call the home number listed on your records the next business day following your procedure to check on you and address any questions or concerns that you may have at that time regarding the information given to you following your procedure. This is a courtesy call and so if there is no answer at the home number and we have not heard from you through the emergency physician on call, we will assume that you have returned to your regular daily activities without incident.  SIGNATURES/CONFIDENTIALITY: You and/or your care partner have signed paperwork which will be entered into your electronic medical record.  These signatures attest to the fact that that the information above on your After Visit Summary has been reviewed and is understood.  Full responsibility of the confidentiality of   this discharge information lies with you and/or your care-partner.  Polyp-handout given  Repeat colonoscopy will be determined by pathology   

## 2014-01-20 NOTE — Progress Notes (Signed)
Called to room to assist during endoscopic procedure.  Patient ID and intended procedure confirmed with present staff. Received instructions for my participation in the procedure from the performing physician.  

## 2014-01-20 NOTE — Op Note (Signed)
[place]  [address] [place], [address]   COLONOSCOPY PROCEDURE REPORT  PATIENT: Craig, House  MR#: [id number] BIRTHDATE: 1961-06-30 , 56  yrs. old GENDER: Male ENDOSCOPIST: Jerene Bears, MD PROCEDURE DATE:  01/20/2014 PROCEDURE:   Colonoscopy with cold biopsy polypectomy First Screening Colonoscopy - Avg.  risk and is 50 yrs.  old or older Yes.  Prior Negative Screening - Now for repeat screening. N/A  History of Adenoma - Now for follow-up colonoscopy & has been > or = to 3 yrs.  N/A  Polyps Removed Today? Yes. ASA CLASS:   Class II INDICATIONS:average risk screening and first colonoscopy. MEDICATIONS: MAC sedation, administered by CRNA and propofol (Diprivan) 350mg  IV  DESCRIPTION OF PROCEDURE:   After the risks benefits and alternatives of the procedure were thoroughly explained, informed consent was obtained.  A digital rectal exam revealed no rectal mass.   The LB [id number]  endoscope was introduced through the anus and advanced to the cecum, which was identified by both the appendix and ileocecal valve. No adverse events experienced. The quality of the prep was good, using MoviPrep  The instrument was then slowly withdrawn as the colon was fully examined.   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   A sessile polyp measuring 4 mm in size was found in the ascending colon.  A polypectomy was performed with cold forceps.  The resection was complete and the polyp tissue was completely retrieved.   Three sessile polyps ranging between 3-26mm in size were found in the sigmoid colon and rectum.  Polypectomy was performed with cold forceps.  All resections were complete and all polyp tissue was completely retrieved.  Retroflexed views revealed no abnormalities. The time to cecum=4 minutes 46 seconds. Withdrawal time=17 minutes 55 seconds.  The scope was withdrawn and the procedure  completed. COMPLICATIONS: There were no complications.  ENDOSCOPIC IMPRESSION: 1.   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   Sessile polyp measuring 4 mm in size was found in the ascending colon; polypectomy was performed with cold forceps 3.   Three sessile polyps ranging between 3-27mm in size were found in the sigmoid colon and rectum; Polypectomy was performed with cold forceps  RECOMMENDATIONS: 1.  Await pathology results 2.  Timing of repeat colonoscopy will be determined by pathology findings. 3.  You will receive a letter within 1-2 weeks with the results of your biopsy as well as final recommendations.  Please call my office if you have not received a letter after 3 weeks.   eSigned:  Jerene Bears, MD 01/20/2014 9:57 AM       cc: The Patient

## 2014-01-21 ENCOUNTER — Telehealth: Payer: Self-pay | Admitting: *Deleted

## 2014-01-21 NOTE — Telephone Encounter (Signed)
  Follow up Call-  Call back number 01/20/2014  Post procedure Call Back phone  # [phone]  Permission to leave phone message Yes     Patient questions:  Do you have a fever, pain , or abdominal swelling? no Pain Score  0 *  Have you tolerated food without any problems? yes  Have you been able to return to your normal activities? yes  Do you have any questions about your discharge instructions: Diet   no Medications  no Follow up visit  no  Do you have questions or concerns about your Care? no  Actions: * If pain score is 4 or above: No action needed, pain <4.

## 2014-01-29 ENCOUNTER — Encounter: Payer: Self-pay | Admitting: Internal Medicine

## 2014-08-29 ENCOUNTER — Other Ambulatory Visit: Payer: Self-pay

## 2016-01-18 DIAGNOSIS — J4520 Mild intermittent asthma, uncomplicated: Secondary | ICD-10-CM | POA: Insufficient documentation

## 2016-02-03 ENCOUNTER — Ambulatory Visit: Payer: Self-pay | Admitting: Gastroenterology

## 2016-03-24 ENCOUNTER — Encounter: Payer: Self-pay | Admitting: *Deleted

## 2016-04-08 ENCOUNTER — Ambulatory Visit: Payer: Self-pay | Admitting: Internal Medicine

## 2016-09-08 ENCOUNTER — Encounter: Payer: Self-pay | Admitting: Nurse Practitioner

## 2017-12-08 ENCOUNTER — Other Ambulatory Visit: Payer: Self-pay | Admitting: Medical

## 2017-12-08 ENCOUNTER — Encounter: Payer: Self-pay | Admitting: Medical

## 2017-12-08 ENCOUNTER — Ambulatory Visit (INDEPENDENT_AMBULATORY_CARE_PROVIDER_SITE_OTHER): Payer: BLUE CROSS/BLUE SHIELD | Admitting: Medical

## 2017-12-08 VITALS — BP 140/86 | HR 84 | Temp 98.1°F | Resp 16 | Ht 67.0 in | Wt 207.4 lb

## 2017-12-08 DIAGNOSIS — Z125 Encounter for screening for malignant neoplasm of prostate: Secondary | ICD-10-CM

## 2017-12-08 DIAGNOSIS — Z113 Encounter for screening for infections with a predominantly sexual mode of transmission: Secondary | ICD-10-CM

## 2017-12-08 DIAGNOSIS — Z0000 Encounter for general adult medical examination without abnormal findings: Secondary | ICD-10-CM | POA: Diagnosis not present

## 2017-12-08 DIAGNOSIS — Z862 Personal history of diseases of the blood and blood-forming organs and certain disorders involving the immune mechanism: Secondary | ICD-10-CM

## 2017-12-08 DIAGNOSIS — Z23 Encounter for immunization: Secondary | ICD-10-CM

## 2017-12-08 DIAGNOSIS — R5383 Other fatigue: Secondary | ICD-10-CM | POA: Diagnosis not present

## 2017-12-08 DIAGNOSIS — Z1211 Encounter for screening for malignant neoplasm of colon: Secondary | ICD-10-CM

## 2017-12-08 DIAGNOSIS — K635 Polyp of colon: Secondary | ICD-10-CM

## 2017-12-08 LAB — LIPID PANEL
Cholesterol: 159 mg/dL (ref 0–200)
HDL: 40.1 mg/dL (ref >39.00)
LDL Cholesterol: 91 mg/dL (ref 0–99)
NonHDL: 118.45
TRIGLYCERIDES: 137 mg/dL (ref 0.0–149.0)
Total CHOL/HDL Ratio: 4
VLDL: 27.4 mg/dL (ref 0.0–40.0)

## 2017-12-08 LAB — CBC WITH DIFFERENTIAL/PLATELET
Basophils Absolute: 0 10*3/uL (ref 0.0–0.1)
Basophils Relative: 0.6 % (ref 0.0–3.0)
EOS PCT: 1.4 % (ref 0.0–5.0)
Eosinophils Absolute: 0.1 10*3/uL (ref 0.0–0.7)
HEMATOCRIT: 42.5 % (ref 39.0–52.0)
HEMOGLOBIN: 14.6 g/dL (ref 13.0–17.0)
LYMPHS PCT: 39.8 % (ref 12.0–46.0)
Lymphs Abs: 2.2 10*3/uL (ref 0.7–4.0)
MCHC: 34.2 g/dL (ref 30.0–36.0)
MCV: 96.8 fl (ref 78.0–100.0)
Monocytes Absolute: 0.6 10*3/uL (ref 0.1–1.0)
Monocytes Relative: 10.2 % (ref 3.0–12.0)
Neutro Abs: 2.6 10*3/uL (ref 1.4–7.7)
Neutrophils Relative %: 48 % (ref 43.0–77.0)
Platelets: 288 10*3/uL (ref 150.0–400.0)
RBC: 4.4 Mil/uL (ref 4.22–5.81)
RDW: 12.9 % (ref 11.5–15.5)
WBC: 5.4 10*3/uL (ref 4.0–10.5)

## 2017-12-08 LAB — COMPREHENSIVE METABOLIC PANEL
ALBUMIN: 4.5 g/dL (ref 3.5–5.2)
ALK PHOS: 56 U/L (ref 39–117)
ALT: 48 U/L (ref 0–53)
AST: 24 U/L (ref 0–37)
BUN: 19 mg/dL (ref 6–23)
CALCIUM: 9.8 mg/dL (ref 8.4–10.5)
CHLORIDE: 98 meq/L (ref 96–112)
CO2: 31 mEq/L (ref 19–32)
Creatinine, Ser: 1.32 mg/dL (ref 0.40–1.50)
GFR: 71.62 mL/min (ref >60.00)
Glucose, Bld: 185 mg/dL — ABNORMAL HIGH (ref 70–99)
POTASSIUM: 4.3 meq/L (ref 3.5–5.1)
Sodium: 138 mEq/L (ref 135–145)
TOTAL PROTEIN: 8.2 g/dL (ref 6.0–8.3)
Total Bilirubin: 0.8 mg/dL (ref 0.2–1.2)

## 2017-12-08 LAB — URINALYSIS, ROUTINE W REFLEX MICROSCOPIC
BILIRUBIN URINE: NEGATIVE
HGB URINE DIPSTICK: NEGATIVE
Ketones, ur: NEGATIVE
LEUKOCYTES UA: NEGATIVE
Nitrite: NEGATIVE
RBC / HPF: NONE SEEN (ref 0–?)
SPECIFIC GRAVITY, URINE: 1.015 (ref 1.000–1.030)
Urine Glucose: NEGATIVE
Urobilinogen, UA: 1 (ref 0.0–1.0)
pH: 7.5 (ref 5.0–8.0)

## 2017-12-08 LAB — TSH: TSH: 1.54 u[IU]/mL (ref 0.35–4.50)

## 2017-12-08 LAB — PSA: PSA: 1.57 ng/mL (ref 0.10–4.00)

## 2017-12-08 NOTE — Patient Instructions (Addendum)
For you wellness exam today I have ordered cbc, cmp, psa, lipid panel, ua and hiv.  Ifob order placed. Refer to GI made. If no call can call office and ask for update from [facility].  Vaccine given today tdap.  Recommend exercise and healthy diet.  We will let you know lab results as they come in.  Follow up date appointment will be determined after lab review.    Preventive Care 40-64 Years, Male Preventive care refers to lifestyle choices and visits with your health care provider that can promote health and wellness. What does preventive care include?  A yearly physical exam. This is also called an annual well check.  Dental exams once or twice a year.  Routine eye exams. Ask your health care provider how often you should have your eyes checked.  Personal lifestyle choices, including: ? Daily care of your teeth and gums. ? Regular physical activity. ? Eating a healthy diet. ? Avoiding tobacco and drug use. ? Limiting alcohol use. ? Practicing safe sex. ? Taking low-dose aspirin every day starting at age 92. What happens during an annual well check? The services and screenings done by your health care provider during your annual well check will depend on your age, overall health, lifestyle risk factors, and family history of disease. Counseling Your health care provider may ask you questions about your:  Alcohol use.  Tobacco use.  Drug use.  Emotional well-being.  Home and relationship well-being.  Sexual activity.  Eating habits.  Work and work Statistician.  Screening You may have the following tests or measurements:  Height, weight, and BMI.  Blood pressure.  Lipid and cholesterol levels. These may be checked every 5 years, or more frequently if you are over 78 years old.  Skin check.  Lung cancer screening. You may have this screening every year starting at age 26 if you have a 30-pack-year history of smoking and currently smoke or have quit within the  past 15 years.  Fecal occult blood test (FOBT) of the stool. You may have this test every year starting at age 31.  Flexible sigmoidoscopy or colonoscopy. You may have a sigmoidoscopy every 5 years or a colonoscopy every 10 years starting at age 40.  Prostate cancer screening. Recommendations will vary depending on your family history and other risks.  Hepatitis C blood test.  Hepatitis B blood test.  Sexually transmitted disease (STD) testing.  Diabetes screening. This is done by checking your blood sugar (glucose) after you have not eaten for a while (fasting). You may have this done every 1-3 years.  Discuss your test results, treatment options, and if necessary, the need for more tests with your health care provider. Vaccines Your health care provider may recommend certain vaccines, such as:  Influenza vaccine. This is recommended every year.  Tetanus, diphtheria, and acellular pertussis (Tdap, Td) vaccine. You may need a Td booster every 10 years.  Varicella vaccine. You may need this if you have not been vaccinated.  Zoster vaccine. You may need this after age 46.  Measles, mumps, and rubella (MMR) vaccine. You may need at least one dose of MMR if you were born in 1957 or later. You may also need a second dose.  Pneumococcal 13-valent conjugate (PCV13) vaccine. You may need this if you have certain conditions and have not been vaccinated.  Pneumococcal polysaccharide (PPSV23) vaccine. You may need one or two doses if you smoke cigarettes or if you have certain conditions.  Meningococcal vaccine. You  may need this if you have certain conditions.  Hepatitis A vaccine. You may need this if you have certain conditions or if you travel or work in places where you may be exposed to hepatitis A.  Hepatitis B vaccine. You may need this if you have certain conditions or if you travel or work in places where you may be exposed to hepatitis B.  Haemophilus influenzae type b (Hib)  vaccine. You may need this if you have certain risk factors.  Talk to your health care provider about which screenings and vaccines you need and how often you need them. This information is not intended to replace advice given to you by your health care provider. Make sure you discuss any questions you have with your health care provider. Document Released: 11/27/2015 Document Revised: 07/20/2016 Document Reviewed: 09/01/2015 Elsevier Interactive Patient Education  [employer].

## 2017-12-08 NOTE — Progress Notes (Signed)
Subjective:    Patient ID: Craig House, male    DOB: [birth year], 59 y.o.   MRN: [id number]  HPI  Pt in for first time.  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 Pt does not exercise regularly. Diet healthy. Coffee and tea in am. Non smoker. Occasional alcohol. 1 glass red wine at night. Single. 2 children.  Has girlfriend  Pt states no recent tetanus. Will get today. Up to date on flu vaccine.  Pt has htn. When he checks at home readings are 130/80 range. Sometimes on Monday after weekend and increase salt intake spikes mild.  Pt has high cholesterol. No recent check. Pt is fasting today.   Seasonal allergies- spring worse season.   Hx of asthma- last time used inhaler 3 weeks ago. Sometimes when allergies flare will use inhaler more frequent.   Told anemia last year. Told to take iron. Told blood in stool. Pt referred to GI but he never went.     Review of Systems  Constitutional: Negative for chills, fatigue and fever.  HENT: Negative for congestion, ear pain, facial swelling, hearing loss, mouth sores, nosebleeds, sinus pressure and sinus pain.   Respiratory: Negative for cough, chest tightness, shortness of breath and wheezing.   Cardiovascular: Negative for chest pain and palpitations.  Gastrointestinal: Negative for abdominal pain, blood in stool, constipation, diarrhea, nausea and vomiting.  Genitourinary: Negative for difficulty urinating, flank pain, genital sores and penile pain.  Musculoskeletal: Negative for arthralgias, back pain, neck pain and neck stiffness.       Years with chronic knee pain.  Prevents him from exercising.  Some occasional left shoulder pain as well.  Note no associated cardiac signs and symptoms with shoulder pain.  Skin: Negative for rash.  Neurological: Negative for dizziness, weakness and headaches.  Hematological: Negative for adenopathy. Does not bruise/bleed easily.  Psychiatric/Behavioral: Negative for behavioral problems,  confusion and sleep disturbance. The patient is not nervous/anxious.     Past Medical History:  Diagnosis Date  . Allergy    spring  . Anemia   . Asthma   . Hyperplastic colon polyp   . Hypertension   . Obesity      Social History   Socioeconomic History  . Marital status: Divorced    Spouse name: Not on file  . Number of children: Not on file  . Years of education: Not on file  . Highest education level: Not on file  Social Needs  . Financial resource strain: Not on file  . Food insecurity - worry: Not on file  . Food insecurity - inability: Not on file  . Transportation needs - medical: Not on file  . Transportation needs - non-medical: Not on file  Occupational History  . Not on file  Tobacco Use  . Smoking status: Former Research scientist (life sciences)  . Smokeless tobacco: Never Used  Substance and Sexual Activity  . Alcohol use: Yes    Alcohol/week: 0.5 oz    Types: 1 drink(s) per week  . Drug use: No  . Sexual activity: Not on file  Other Topics Concern  . Not on file  Social History Narrative  . Not on file    Past Surgical History:  Procedure Laterality Date  . KNEE ARTHROSCOPY Bilateral     Family History  Problem Relation Age of Onset  . Heart attack Mother   . Cancer Father   . Heart disease Father   . Colon cancer Neg Hx     No  Known Allergies  Current Outpatient Medications on File Prior to Visit  Medication Sig Dispense Refill  . albuterol (PROAIR HFA) 108 (90 Base) MCG/ACT inhaler Inhale into the lungs.    Marland Kitchen amLODipine (NORVASC) 10 MG tablet Take by mouth.    Marland Kitchen aspirin EC 81 MG tablet Take by mouth.    Marland Kitchen atorvastatin (LIPITOR) 20 MG tablet Take by mouth.    . DOCOSAHEXAENOIC ACID PO Take by mouth.    . Iron-Vitamin C 65-125 MG TABS Take by mouth.    . montelukast (SINGULAIR) 10 MG tablet Take by mouth.    . Multiple Vitamin (MULTI-VITAMINS) TABS Take by mouth.    . naproxen sodium (ANAPROX) 220 MG tablet Take 440 mg by mouth 2 (two) times daily as needed.     . triamterene-hydrochlorothiazide (DYAZIDE) 37.5-25 MG per capsule Take 1 each (1 capsule total) by mouth every morning. 30 capsule 3  . UNABLE TO FIND Med Name: Tumeric root oral     No current facility-administered medications on file prior to visit.     BP (!) 142/86 (BP Location: Left Arm, Patient Position: Sitting, Cuff Size: Large)   Pulse 84   Temp 98.1 F (36.7 C) (Oral)   Resp 16   Ht 5\' 7"  (1.702 m)   Wt 207 lb 6.4 oz (94.1 kg)   SpO2 98%   BMI 32.48 kg/m      Objective:   Physical Exam  General Mental Status- Alert. General Appearance- Not in acute distress.   Skin General: Color- Normal Color. Moisture- Normal Moisture. Normal. No worrisome moles.  Neck Carotid Arteries- Normal color. Moisture- Normal Moisture. No carotid bruits. No JVD.  Chest and Lung Exam Auscultation: Breath Sounds:-Normal.  Cardiovascular Auscultation:Rythm- Regular. Murmurs & Other Heart Sounds:Auscultation of the heart reveals- No Murmurs.  Abdomen Inspection:-Inspeection Normal. Palpation/Percussion:Note:No mass. Palpation and Percussion of the abdomen reveal- umbilical hernia moderate but not tender. Otherwise normal/non Tender, Non Distended + BS, no rebound or guarding.    Neurologic Cranial Nerve exam:- CN III-XII intact(No nystagmus), symmetric smile. Strength:- 5/5 equal and symmetric strength both upper and lower extremities.  Rectal exam- deferred. Gential exam- deferred.  Knees- bilateral knee crepitus. Left shoulder- faint pain on rom. Mild bicep tendon tenderness.     Assessment & Plan:  For you wellness exam today I have ordered cbc, cmp, psa, lipid panel, ua and hiv.  Ifob order placed. Refer to GI made. If no call can call office and ask for update from [facility].  Vaccine given today tdap.  Recommend exercise and healthy diet.  We will let you know lab results as they come in.  Follow up date appointment will be determined after lab review.

## 2017-12-09 LAB — HIV ANTIBODY (ROUTINE TESTING W REFLEX): HIV 1&2 Ab, 4th Generation: NONREACTIVE

## 2017-12-11 ENCOUNTER — Other Ambulatory Visit (INDEPENDENT_AMBULATORY_CARE_PROVIDER_SITE_OTHER): Payer: BLUE CROSS/BLUE SHIELD

## 2017-12-11 ENCOUNTER — Telehealth: Payer: Self-pay | Admitting: Medical

## 2017-12-11 DIAGNOSIS — R739 Hyperglycemia, unspecified: Secondary | ICD-10-CM

## 2017-12-11 LAB — HEMOGLOBIN A1C: Hgb A1c MFr Bld: 6.9 % — ABNORMAL HIGH (ref 4.6–6.5)

## 2017-12-11 MED ORDER — TRIAMTERENE-HCTZ 37.5-25 MG PO CAPS: 1.0000 | ORAL_CAPSULE | ORAL | 3 refills | Status: DC

## 2017-12-11 NOTE — Telephone Encounter (Signed)
Copied from [place] [id number] [id number]. Topic: Quick Communication - Rx Refill/Question >> Dec 11, 2017  3:23 PM Cecelia Byars, NT wrote: Medication triamterene hydrochlorothiazide  37.5- 25 Has the patient contacted their pharmacy? yes  (Agent: If no, request that the patient contact the pharmacy for the refill. Preferred Pharmacy (with phone number or street name): [facility]  [address]: Please be advised that RX refills may take up to 3 business days. We ask that you follow-up with your pharmacy.

## 2017-12-11 NOTE — Telephone Encounter (Signed)
Please advise 

## 2017-12-11 NOTE — Telephone Encounter (Signed)
Faxed add on for HgbA1c.  Confirmation received.//AB/CMA

## 2017-12-11 NOTE — Telephone Encounter (Signed)
-----   Message from Mackie Pai, PA-C sent at 12/09/2017  2:24 PM EST ----- Can add an A1c to patient's labs drawn the other day.  Diagnosis to associated with would be hyperglycemia.  Let me know if it is too late  Thanks Percell Miller

## 2017-12-11 NOTE — Telephone Encounter (Signed)
Triamterene-hydrochlorothiazide 37.5-25 mg refill Last OV: 12/08/17 Last Refill:Done by prior provider  Pharmacy:[facility] [phone]

## 2017-12-11 NOTE — Telephone Encounter (Signed)
Refilled pt triamterine-hctz bp med. Notify pt.

## 2017-12-11 NOTE — Telephone Encounter (Signed)
Copied from [place] [id number]. Topic: Quick Communication - Rx Refill/Question >> Dec 11, 2017  3:23 PM Cecelia Byars, NT wrote: Medication triamterene hydrochlorothiazide  37.5- 25 Has the patient contacted their pharmacy? yes  (Agent: If no, request that the patient contact the pharmacy for the refill. Preferred Pharmacy (with phone number or street name [facility]  [phone] Agent: Please be advised that RX refills may take up to 3 business days. We ask that you follow-up with your pharmacy.

## 2017-12-11 NOTE — Addendum Note (Signed)
Addended by: Anabel Halon on: 12/11/2017 09:27 PM   Modules accepted: Orders

## 2017-12-12 ENCOUNTER — Telehealth: Payer: Self-pay | Admitting: Medical

## 2017-12-12 MED ORDER — METFORMIN HCL 500 MG PO TABS: 500.0000 mg | ORAL_TABLET | Freq: Two times a day (BID) | ORAL | 3 refills | Status: DC

## 2017-12-12 NOTE — Telephone Encounter (Signed)
Prescription of metformin sent to patient's pharmacy.

## 2017-12-13 NOTE — Telephone Encounter (Signed)
Please call and schedule pt follow up.

## 2017-12-13 NOTE — Telephone Encounter (Signed)
Refilled patient's Lipitor and amlodipine.  Please notify patient.  Also remind him to follow-up in 3 months before he runs out of medication.  Want to repeat lipid panel fasting and will repeat A1c.

## 2017-12-13 NOTE — Telephone Encounter (Signed)
Pt requesting Amlodipine and Lipitor last filled 04/07/16 not prescribed by PCP.  Please advise.

## 2017-12-29 ENCOUNTER — Other Ambulatory Visit (INDEPENDENT_AMBULATORY_CARE_PROVIDER_SITE_OTHER): Payer: BLUE CROSS/BLUE SHIELD

## 2017-12-29 DIAGNOSIS — Z1211 Encounter for screening for malignant neoplasm of colon: Secondary | ICD-10-CM

## 2017-12-29 DIAGNOSIS — Z862 Personal history of diseases of the blood and blood-forming organs and certain disorders involving the immune mechanism: Secondary | ICD-10-CM | POA: Diagnosis not present

## 2017-12-29 DIAGNOSIS — K635 Polyp of colon: Secondary | ICD-10-CM

## 2017-12-29 LAB — FECAL OCCULT BLOOD, IMMUNOCHEMICAL: Fecal Occult Bld: NEGATIVE

## 2018-02-05 ENCOUNTER — Encounter: Payer: Self-pay | Admitting: Medical

## 2018-03-08 ENCOUNTER — Other Ambulatory Visit: Payer: Self-pay | Admitting: Medical

## 2018-04-01 ENCOUNTER — Other Ambulatory Visit: Payer: Self-pay | Admitting: Medical

## 2018-05-03 ENCOUNTER — Other Ambulatory Visit: Payer: Self-pay | Admitting: Medical

## 2018-06-04 ENCOUNTER — Ambulatory Visit (INDEPENDENT_AMBULATORY_CARE_PROVIDER_SITE_OTHER): Payer: BLUE CROSS/BLUE SHIELD | Admitting: Medical

## 2018-06-04 ENCOUNTER — Ambulatory Visit (HOSPITAL_BASED_OUTPATIENT_CLINIC_OR_DEPARTMENT_OTHER)
Admission: RE | Admit: 2018-06-04 | Discharge: 2018-06-04 | Disposition: A | Discharge status: Home / Self Care | Payer: BLUE CROSS/BLUE SHIELD | Source: Ambulatory Visit | Attending: Medical | Admitting: Medical

## 2018-06-04 ENCOUNTER — Encounter: Payer: Self-pay | Admitting: Medical

## 2018-06-04 VITALS — BP 135/86 | HR 73 | Temp 97.7°F | Resp 16 | Ht 67.0 in | Wt 191.8 lb

## 2018-06-04 DIAGNOSIS — E785 Hyperlipidemia, unspecified: Secondary | ICD-10-CM | POA: Diagnosis not present

## 2018-06-04 DIAGNOSIS — I10 Essential (primary) hypertension: Secondary | ICD-10-CM | POA: Diagnosis not present

## 2018-06-04 DIAGNOSIS — G8929 Other chronic pain: Secondary | ICD-10-CM | POA: Diagnosis not present

## 2018-06-04 DIAGNOSIS — E119 Type 2 diabetes mellitus without complications: Secondary | ICD-10-CM | POA: Diagnosis not present

## 2018-06-04 DIAGNOSIS — M1712 Unilateral primary osteoarthritis, left knee: Secondary | ICD-10-CM | POA: Insufficient documentation

## 2018-06-04 DIAGNOSIS — M25562 Pain in left knee: Secondary | ICD-10-CM | POA: Diagnosis not present

## 2018-06-04 MED ORDER — AMLODIPINE BESYLATE 10 MG PO TABS: 10.0000 mg | ORAL_TABLET | Freq: Every day | ORAL | 1 refills | Status: DC

## 2018-06-04 MED ORDER — TRIAMTERENE-HCTZ 37.5-25 MG PO CAPS: 1.0000 | ORAL_CAPSULE | Freq: Every morning | ORAL | 1 refills | Status: DC

## 2018-06-04 MED ORDER — ATORVASTATIN CALCIUM 20 MG PO TABS: 20.0000 mg | ORAL_TABLET | Freq: Every day | ORAL | 1 refills | Status: DC

## 2018-06-04 MED ORDER — CLOTRIMAZOLE-BETAMETHASONE 1-0.05 % EX CREA: 1.0000 "application " | TOPICAL_CREAM | Freq: Two times a day (BID) | CUTANEOUS | 0 refills | Status: DC

## 2018-06-04 NOTE — Progress Notes (Signed)
Subjective:    Patient ID: Craig House, male    DOB: 08-31-1961, 57 y.o.   MRN: [id number]  HPI  Pt in for follow up.  Pt has been watching his diet. Eating less sugar and has been dieting. He has been on metformin. No side effects reported.   Pt has been taking  bp meds and cholesterol meds. No side effects.  Pt has left knee pain for years progressive worse. Pt stands a lot at work. In past mild arthritic changes.    Has rt forearm mild itch rash for about 2 weeks. Pt calamine lotion and scrubbed area agressively this morning breaking skin. Pt not sure what caused this.He speculates insect bite but is not sure.   Review of Systems  Constitutional: Negative for chills, fatigue and fever.  Respiratory: Negative for cough, chest tightness, shortness of breath and wheezing.   Cardiovascular: Negative for chest pain and palpitations.  Gastrointestinal: Negative for abdominal pain.  Genitourinary: Negative for flank pain, frequency and urgency.  Musculoskeletal: Negative for back pain and gait problem.       Rt mid forearm- 2 cm rash, mild dry feel to skin. No dc. No warmth.   Skin: Negative for rash.  Neurological: Negative for dizziness, seizures, weakness and headaches.  Hematological: Negative for adenopathy. Does not bruise/bleed easily.  Psychiatric/Behavioral: Negative for behavioral problems and confusion. The patient is not nervous/anxious.    Past Medical History:  Diagnosis Date  . Allergy    spring  . Anemia   . Asthma   . Hyperplastic colon polyp   . Hypertension   . Obesity      Social History   Socioeconomic History  . Marital status: Divorced    Spouse name: Not on file  . Number of children: Not on file  . Years of education: Not on file  . Highest education level: Not on file  Occupational History  . Not on file  Social Needs  . Financial resource strain: Not on file  . Food insecurity:    Worry: Not on file    Inability: Not on file    . Transportation needs:    Medical: Not on file    Non-medical: Not on file  Tobacco Use  . Smoking status: Former Research scientist (life sciences)  . Smokeless tobacco: Never Used  Substance and Sexual Activity  . Alcohol use: Yes    Alcohol/week: 0.6 oz    Types: 1 Standard drinks or equivalent per week    Comment: 1 glass of red wine at night  . Drug use: No  . Sexual activity: Yes  Lifestyle  . Physical activity:    Days per week: Not on file    Minutes per session: Not on file  . Stress: Not on file  Relationships  . Social connections:    Talks on phone: Not on file    Gets together: Not on file    Attends religious service: Not on file    Active member of club or organization: Not on file    Attends meetings of clubs or organizations: Not on file    Relationship status: Not on file  . Intimate partner violence:    Fear of current or ex partner: Not on file    Emotionally abused: Not on file    Physically abused: Not on file    Forced sexual activity: Not on file  Other Topics Concern  . Not on file  Social History Narrative  . Not on  file    Past Surgical History:  Procedure Laterality Date  . KNEE ARTHROSCOPY Bilateral     Family History  Problem Relation Age of Onset  . Heart attack Mother   . Cancer Father   . Heart disease Father   . Colon cancer Neg Hx     No Known Allergies  Current Outpatient Medications on File Prior to Visit  Medication Sig Dispense Refill  . albuterol (PROAIR HFA) 108 (90 Base) MCG/ACT inhaler Inhale into the lungs.    Marland Kitchen aspirin EC 81 MG tablet Take by mouth.    . DOCOSAHEXAENOIC ACID PO Take by mouth.    . Iron-Vitamin C 65-125 MG TABS Take by mouth.    . metFORMIN (GLUCOPHAGE) 500 MG tablet TAKE 1 TABLET(500 MG) BY MOUTH TWICE DAILY WITH A MEAL 60 tablet 0  . montelukast (SINGULAIR) 10 MG tablet Take by mouth.    . Multiple Vitamin (MULTI-VITAMINS) TABS Take by mouth.    . naproxen sodium (ANAPROX) 220 MG tablet Take 440 mg by mouth 2 (two)  times daily as needed.    Marland Kitchen UNABLE TO FIND Med Name: Tumeric root oral     No current facility-administered medications on file prior to visit.     BP 135/86   Pulse 73   Temp 97.7 F (36.5 C) (Oral)   Resp 16   Ht 5\' 7"  (1.702 m)   Wt 191 lb 12.8 oz (87 kg)   SpO2 100%   BMI 30.04 kg/m       Objective:   Physical Exam  General Mental Status- Alert. General Appearance- Not in acute distress.    Neck Carotid Arteries- Normal color. Moisture- Normal Moisture. No carotid bruits. No JVD.  Chest and Lung Exam Auscultation: Breath Sounds:-Normal.  Cardiovascular Auscultation:Rythm- Regular. Murmurs & Other Heart Sounds:Auscultation of the heart reveals- No Murmurs.  Abdomen Inspection:-Inspeection Normal. Palpation/Percussion:Note:No mass. Palpation and Percussion of the abdomen reveal- Non Tender, Non Distended + BS, no rebound or guarding.  Neurologic Cranial Nerve exam:- CN III-XII intact(No nystagmus), symmetric smile. Strength:- 5/5 equal and symmetric strength both upper and lower extremities.  Skin- mid forearm. 1 cm area rash. Milddry feel. No dc. No redness. No dc.     Assessment & Plan:  For your history of elevated blood pressure, I want you to continue your current blood pressure medicine.  For high cholesterol, continue current medication as your recent lipid panel looks very good.  For diabetes, I am placing order to get metabolic panel and H8N.  We will follow your 69-month blood sugar average results and see if you need to be on higher dose of metformin.  Continue low-cholesterol and low sugar diet.  Try to get some daily exercise as tolerated.  For your left knee pain, I am going to get x-ray to evaluate if your prior degenerative changes have worsened.  Then will refer you to orthopedist.  Follow-up date to be determined after lab review.  Mackie Pai, PA-C

## 2018-06-04 NOTE — Patient Instructions (Addendum)
For your history of elevated blood pressure, I want you to continue your current blood pressure medicine.  For high cholesterol, continue current medication as your recent lipid panel looks very good.  For diabetes, I am placing order to get metabolic panel and Y0K.  We will follow your 29-month blood sugar average results and see if you need to be on higher dose of metformin.  Continue low-cholesterol and low sugar diet.  Try to get some daily exercise as tolerated.  For your left knee pain, I am going to get x-ray to evaluate if your prior degenerative changes have worsened.  Then will refer you to orthopedist.  Follow-up date to be determined after lab review.                  Marland KitchenMarland Kitchen

## 2018-06-05 LAB — COMPREHENSIVE METABOLIC PANEL
ALT: 27 U/L (ref 0–53)
AST: 19 U/L (ref 0–37)
Albumin: 4.5 g/dL (ref 3.5–5.2)
Alkaline Phosphatase: 55 U/L (ref 39–117)
BILIRUBIN TOTAL: 0.5 mg/dL (ref 0.2–1.2)
BUN: 20 mg/dL (ref 6–23)
CALCIUM: 9.8 mg/dL (ref 8.4–10.5)
CO2: 29 mEq/L (ref 19–32)
CREATININE: 1.45 mg/dL (ref 0.40–1.50)
Chloride: 101 mEq/L (ref 96–112)
GFR: 64.16 mL/min (ref >60.00)
Glucose, Bld: 77 mg/dL (ref 70–99)
POTASSIUM: 4.4 meq/L (ref 3.5–5.1)
Sodium: 138 mEq/L (ref 135–145)
Total Protein: 8.1 g/dL (ref 6.0–8.3)

## 2018-06-05 LAB — HEMOGLOBIN A1C: Hgb A1c MFr Bld: 5.2 % (ref 4.6–6.5)

## 2018-06-07 ENCOUNTER — Other Ambulatory Visit: Payer: Self-pay

## 2018-06-07 MED ORDER — METFORMIN HCL 500 MG PO TABS: ORAL_TABLET | ORAL | 0 refills | Status: DC

## 2018-06-19 ENCOUNTER — Ambulatory Visit (INDEPENDENT_AMBULATORY_CARE_PROVIDER_SITE_OTHER): Payer: BLUE CROSS/BLUE SHIELD | Admitting: Orthopaedic Surgery

## 2018-06-19 ENCOUNTER — Ambulatory Visit (INDEPENDENT_AMBULATORY_CARE_PROVIDER_SITE_OTHER): Payer: Managed Care, Other (non HMO)

## 2018-06-19 ENCOUNTER — Encounter (INDEPENDENT_AMBULATORY_CARE_PROVIDER_SITE_OTHER): Payer: Self-pay | Admitting: Orthopaedic Surgery

## 2018-06-19 VITALS — BP 153/92 | HR 84 | Ht 67.0 in | Wt 191.0 lb

## 2018-06-19 DIAGNOSIS — M170 Bilateral primary osteoarthritis of knee: Secondary | ICD-10-CM | POA: Diagnosis not present

## 2018-06-19 MED ORDER — TRAMADOL HCL 50 MG PO TABS: 50.0000 mg | ORAL_TABLET | Freq: Every day | ORAL | 0 refills | Status: DC

## 2018-06-19 NOTE — Progress Notes (Signed)
Office Visit Note   Patient: Craig House           Date of Birth: Aug 27, 1961           MRN: [id number] Visit Date: 06/19/2018              Requested by: Mackie Pai, PA-C [address] [place], [place] [address] PCP: Mackie Pai, PA-C   Assessment & Plan: Visit Diagnoses:  1. Bilateral primary osteoarthritis of knee     Plan: Bilateral end-stage osteoarthritis of the knees.  Long discussion regarding diagnosis and treatment options.  Craig House would like to proceed with a left total knee replacement.  I discussed the surgery, hospitalization, rehab, time out of work.  Clearance form.  Also give him a handicap parking sticker form a prescription for tramadol for sleep.  Office visit over 45 minutes 50% of the time in counseling regarding diagnosis and treatment options  Follow-Up Instructions: No follow-ups on file.   Orders:  Orders Placed This Encounter  Procedures  . XR KNEE 3 VIEW RIGHT   No orders of the defined types were placed in this encounter.     Procedures: No procedures performed   Clinical Data: No additional findings.   Subjective: Chief Complaint  Patient presents with  . New Patient (Initial Visit)    BI LAT KNEE PAIN SINCE 2005 L KNEE IS WORSE NO INJURY JUST GETTING WORSE. HAD CORTISONE SHOT IN 2013 DIDNT HELP. WOULD LIE TO DISCUSS OTHER OPTIONS  Craig House is 57 years old and visited the office with a chronic history of bilateral knee pain.  He is aware of a diagnosis of osteoarthritis of his left knee by prior films.  I evaluated these on the PACS system and agree that he has end-stage osteoarthritis with near bone-on-bone in the medial compartment with large osteophytes.  He has changes of osteoarthritis involving the patellofemoral joint and lateral compartment as well.  Over the years she is tried anti-inflammatory medicines, Tylenol, injections and bracing.  He has reached the point where he is frustrated with his pain.   He has significant compromise of his activities. He is also experiencing pain in his right knee.  He is wearing a hinged brace on the left and a nonhinged brace on the right  HPI  Review of Systems  Constitutional: Negative for fatigue and fever.  HENT: Negative for ear pain.   Eyes: Negative for pain.  Respiratory: Negative for cough and shortness of breath.   Cardiovascular: Positive for leg swelling.  Gastrointestinal: Negative for constipation and diarrhea.  Genitourinary: Negative for difficulty urinating.  Musculoskeletal: Negative for back pain and neck pain.  Skin: Positive for rash.  Allergic/Immunologic: Negative for food allergies.  Neurological: Positive for weakness and numbness.  Hematological: Does not bruise/bleed easily.  Psychiatric/Behavioral: Positive for sleep disturbance.     Objective: Vital Signs: BP (!) 153/92 (BP Location: Left Arm, Patient Position: Sitting, Cuff Size: Normal)   Pulse 84   Ht 5\' 7"  (1.702 m)   Wt 191 lb (86.6 kg)   BMI 29.91 kg/m   Physical Exam  Constitutional: He is oriented to person, place, and time. He appears well-developed and well-nourished.  HENT:  Mouth/Throat: Oropharynx is clear and moist.  Eyes: Pupils are equal, round, and reactive to light. EOM are normal.  Pulmonary/Chest: Effort normal.  Neurological: He is alert and oriented to person, place, and time.  Skin: Skin is warm and dry.  Psychiatric: He  has a normal mood and affect. His behavior is normal.    Ortho Exam awake alert and oriented x3.  Comfortable sitting.  Left knee exam with large osteophytes palpated on the medial compartment with diffuse mild to moderate joint pain.  Increased varus.  Moderate effusion.  No instability.  Positive patellar crepitation.  Lacks a few degrees to full extension but flexes over 110 degrees.  No calf pain.  Good pulses.  No distal edema.  No pain with range of motion of either hip.  Straight leg raise negative Right knee with  no effusion.  Mild medial joint pain.  Positive patellar crepitation.  Minimal effusion.  No popliteal pain or calf discomfort.  Neurovascular exam intact.  Mild increased varus.  Specialty Comments:  No specialty comments available.  Imaging: No results found.   PMFS History: Patient Active Problem List   Diagnosis Date Noted  . Bilateral primary osteoarthritis of knee 06/19/2018  . HTN (hypertension) 12/31/2011   Past Medical History:  Diagnosis Date  . Allergy    spring  . Anemia   . Asthma   . Hyperplastic colon polyp   . Hypertension   . Obesity     Family History  Problem Relation Age of Onset  . Heart attack Mother   . Cancer Father   . Heart disease Father   . Colon cancer Neg Hx     Past Surgical History:  Procedure Laterality Date  . KNEE ARTHROSCOPY Bilateral    Social History   Occupational History  . Not on file  Tobacco Use  . Smoking status: Former Research scientist (life sciences)  . Smokeless tobacco: Never Used  Substance and Sexual Activity  . Alcohol use: Yes    Alcohol/week: 0.6 oz    Types: 1 Standard drinks or equivalent per week    Comment: 1 glass of red wine at night  . Drug use: No  . Sexual activity: Yes

## 2018-06-21 ENCOUNTER — Telehealth: Payer: Self-pay | Admitting: *Deleted

## 2018-06-21 NOTE — Telephone Encounter (Signed)
Received Medical/Surgical Clearance Form from [place], contact: Debbi [place]; will forward to provider upon RTO Mon, 06/25/18//SLS 08/08

## 2018-06-22 ENCOUNTER — Telehealth (INDEPENDENT_AMBULATORY_CARE_PROVIDER_SITE_OTHER): Payer: Self-pay | Admitting: Orthopaedic Surgery

## 2018-06-22 NOTE — Telephone Encounter (Signed)
Left message on patient's cell phone voice mail providing my name and direct number for scheduling his left total knee surgery

## 2018-06-28 ENCOUNTER — Telehealth: Payer: Self-pay | Admitting: Medical

## 2018-06-28 NOTE — Telephone Encounter (Signed)
Please let pt know I need to see him to determine if I can clear him for surgery. Explain I have form and got back from vacation. Schedule next week. Try to get him in early am or early pm.

## 2018-07-02 NOTE — Telephone Encounter (Signed)
LVM for pt to call and schedule an appt with provider, recommended by provider to schedule ASAP.

## 2018-07-04 NOTE — Telephone Encounter (Signed)
Called again pt and no respond, LVM again to call office for an appt.

## 2018-07-05 ENCOUNTER — Other Ambulatory Visit: Payer: Self-pay | Admitting: Medical

## 2018-08-04 ENCOUNTER — Other Ambulatory Visit: Payer: Self-pay | Admitting: Medical

## 2018-09-13 ENCOUNTER — Other Ambulatory Visit: Payer: Self-pay | Admitting: Medical

## 2019-01-02 ENCOUNTER — Encounter: Payer: BLUE CROSS/BLUE SHIELD | Admitting: Medical

## 2019-01-04 ENCOUNTER — Ambulatory Visit (INDEPENDENT_AMBULATORY_CARE_PROVIDER_SITE_OTHER): Payer: BLUE CROSS/BLUE SHIELD | Admitting: Medical

## 2019-01-04 ENCOUNTER — Encounter: Payer: Self-pay | Admitting: Medical

## 2019-01-04 VITALS — BP 148/90 | HR 73 | Temp 98.0°F | Resp 16 | Ht 67.0 in | Wt 200.8 lb

## 2019-01-04 DIAGNOSIS — Z125 Encounter for screening for malignant neoplasm of prostate: Secondary | ICD-10-CM | POA: Diagnosis not present

## 2019-01-04 DIAGNOSIS — I10 Essential (primary) hypertension: Secondary | ICD-10-CM | POA: Diagnosis not present

## 2019-01-04 DIAGNOSIS — E785 Hyperlipidemia, unspecified: Secondary | ICD-10-CM | POA: Diagnosis not present

## 2019-01-04 DIAGNOSIS — Z0000 Encounter for general adult medical examination without abnormal findings: Secondary | ICD-10-CM

## 2019-01-04 DIAGNOSIS — E119 Type 2 diabetes mellitus without complications: Secondary | ICD-10-CM

## 2019-01-04 MED ORDER — LOSARTAN POTASSIUM 25 MG PO TABS: 25.0000 mg | ORAL_TABLET | Freq: Every day | ORAL | 3 refills | Status: DC

## 2019-01-04 NOTE — Progress Notes (Signed)
Subjective:    Patient ID: Craig House, male    DOB: [birth year], 58 y.o.   MRN: [id number]  HPI  Pt in for cpe. Pt is fasting.  Pt states working on his feet all day. He states he gets about 4000 steps a day.   Pt states he just got insurance recently. Last year he was about to get surgery for knee replacements. He may need preop physical. Might need to see  His former cardiologist.  Pt has htn.bp before he came her was 139/88. He states he rarely gets better. Often his bp is in 951-884 range systolic.  Pt stopped smoking in  1994.     Review of Systems  Constitutional: Negative for chills, fatigue and fever.  HENT: Negative for congestion and dental problem.   Respiratory: Negative for cough, chest tightness, shortness of breath and wheezing.   Cardiovascular: Negative for chest pain and palpitations.  Gastrointestinal: Negative for abdominal pain and constipation.  Genitourinary:       ED.  Musculoskeletal: Negative for back pain.  Skin: Negative for rash.  Neurological: Negative for dizziness, seizures, speech difficulty, weakness and light-headedness.  Hematological: Negative for adenopathy. Does not bruise/bleed easily.  Psychiatric/Behavioral: Negative for behavioral problems and confusion.    Past Medical History:  Diagnosis Date  . Allergy    spring  . Anemia   . Asthma   . Hyperplastic colon polyp   . Hypertension   . Obesity      Social History   Socioeconomic History  . Marital status: Divorced    Spouse name: Not on file  . Number of children: Not on file  . Years of education: Not on file  . Highest education level: Not on file  Occupational History  . Not on file  Social Needs  . Financial resource strain: Not on file  . Food insecurity:    Worry: Not on file    Inability: Not on file  . Transportation needs:    Medical: Not on file    Non-medical: Not on file  Tobacco Use  . Smoking status: Former Research scientist (life sciences)  . Smokeless tobacco:  Never Used  Substance and Sexual Activity  . Alcohol use: Yes    Alcohol/week: 1.0 standard drinks    Types: 1 Standard drinks or equivalent per week    Comment: 1 glass of red wine at night  . Drug use: No  . Sexual activity: Yes  Lifestyle  . Physical activity:    Days per week: Not on file    Minutes per session: Not on file  . Stress: Not on file  Relationships  . Social connections:    Talks on phone: Not on file    Gets together: Not on file    Attends religious service: Not on file    Active member of club or organization: Not on file    Attends meetings of clubs or organizations: Not on file    Relationship status: Not on file  . Intimate partner violence:    Fear of current or ex partner: Not on file    Emotionally abused: Not on file    Physically abused: Not on file    Forced sexual activity: Not on file  Other Topics Concern  . Not on file  Social History Narrative  . Not on file    Past Surgical History:  Procedure Laterality Date  . KNEE ARTHROSCOPY Bilateral     Family History  Problem Relation Age of  Onset  . Heart attack Mother   . Cancer Father   . Heart disease Father   . Colon cancer Neg Hx     No Known Allergies  Current Outpatient Medications on File Prior to Visit  Medication Sig Dispense Refill  . albuterol (PROAIR HFA) 108 (90 Base) MCG/ACT inhaler Inhale into the lungs.    Marland Kitchen amLODipine (NORVASC) 10 MG tablet TAKE 1 TABLET(10 MG) BY MOUTH DAILY 90 tablet 0  . aspirin EC 81 MG tablet Take by mouth.    Marland Kitchen atorvastatin (LIPITOR) 20 MG tablet TAKE 1 TABLET(20 MG) BY MOUTH DAILY 90 tablet 0  . Iron-Vitamin C 65-125 MG TABS Take by mouth.    . Multiple Vitamin (MULTI-VITAMINS) TABS Take by mouth.    . naproxen sodium (ANAPROX) 220 MG tablet Take 440 mg by mouth 2 (two) times daily as needed.    . triamterene-hydrochlorothiazide (DYAZIDE) 37.5-25 MG capsule TAKE ONE CAPSULE BY MOUTH EVERY MORNING 90 capsule 0  . UNABLE TO FIND Med Name:  Tumeric root oral     No current facility-administered medications on file prior to visit.     BP (!) 148/90   Pulse 73   Temp 98 F (36.7 C) (Oral)   Resp 16   Ht 5\' 7"  (1.702 m)   Wt 200 lb 12.8 oz (91.1 kg)   SpO2 100%   BMI 31.45 kg/m       Objective:   Physical Exam  General Mental Status- Alert. General Appearance- Not in acute distress.   Skin General: Color- Normal Color. Moisture- Normal Moisture.  Neck Carotid Arteries- Normal color. Moisture- Normal Moisture. No carotid bruits. No JVD.  Chest and Lung Exam Auscultation: Breath Sounds:-Normal.  Cardiovascular Auscultation:Rythm- Regular. Murmurs & Other Heart Sounds:Auscultation of the heart reveals- No Murmurs.  Abdomen Inspection:-Inspeection Normal. Palpation/Percussion:Note:No mass. Palpation and Percussion of the abdomen reveal- Non Tender, Non Distended + BS, no rebound or guarding.  Neurologic Cranial Nerve exam:- CN III-XII intact(No nystagmus), symmetric smile. Drift Test:- No drift. Romberg Exam:- Negative.  Heal to Toe Gait exam:-Normal. Finger to Nose:- Normal/Intact Strength:- 5/5 equal and symmetric strength both upper and lower extremities. .    Assessment & Plan:  For you wellness exam today I have ordered cbc, cmp lipid panel, a1c, and psa  Vaccine up to date.  Recommend exercise and healthy diet.  We will let you know lab results as they come in.  Follow up date appointment will be determined after lab review.   ekg done today. Nsr. No ischemic changes.  For htn adding losartan today.  Follow up in 3 months or as needed  [employer], [employer]

## 2019-01-04 NOTE — Patient Instructions (Addendum)
For you wellness exam today I have ordered cbc, cmp lipid panel, a1c, and psa  Vaccine up to date.   Recommend exercise and healthy diet.  We will let you know lab results as they come in.  Follow up date appointment will be determined after lab review.   ekg done today. Nsr. No ischemic changes.  For htn adding losartan today.  Follow up in 3 months or as needed   Preventive Care 40-64 Years, Male Preventive care refers to lifestyle choices and visits with your health care provider that can promote health and wellness. What does preventive care include?   A yearly physical exam. This is also called an annual well check.  Dental exams once or twice a year.  Routine eye exams. Ask your health care provider how often you should have your eyes checked.  Personal lifestyle choices, including: ? Daily care of your teeth and gums. ? Regular physical activity. ? Eating a healthy diet. ? Avoiding tobacco and drug use. ? Limiting alcohol use. ? Practicing safe sex. ? Taking low-dose aspirin every day starting at age 50. What happens during an annual well check? The services and screenings done by your health care provider during your annual well check will depend on your age, overall health, lifestyle risk factors, and family history of disease. Counseling Your health care provider may ask you questions about your:  Alcohol use.  Tobacco use.  Drug use.  Emotional well-being.  Home and relationship well-being.  Sexual activity.  Eating habits.  Work and work Statistician. Screening You may have the following tests or measurements:  Height, weight, and BMI.  Blood pressure.  Lipid and cholesterol levels. These may be checked every 5 years, or more frequently if you are over 61 years old.  Skin check.  Lung cancer screening. You may have this screening every year starting at age 56 if you have a 30-pack-year history of smoking and currently smoke or have quit  within the past 15 years.  Colorectal cancer screening. All adults should have this screening starting at age 10 and continuing until age 66. Your health care provider may recommend screening at age 81. You will have tests every 1-10 years, depending on your results and the type of screening test. People at increased risk should start screening at an earlier age. Screening tests may include: ? Guaiac-based fecal occult blood testing. ? Fecal immunochemical test (FIT). ? Stool DNA test. ? Virtual colonoscopy. ? Sigmoidoscopy. During this test, a flexible tube with a tiny camera (sigmoidoscope) is used to examine your rectum and lower colon. The sigmoidoscope is inserted through your anus into your rectum and lower colon. ? Colonoscopy. During this test, a long, thin, flexible tube with a tiny camera (colonoscope) is used to examine your entire colon and rectum.  Prostate cancer screening. Recommendations will vary depending on your family history and other risks.  Hepatitis C blood test.  Hepatitis B blood test.  Sexually transmitted disease (STD) testing.  Diabetes screening. This is done by checking your blood sugar (glucose) after you have not eaten for a while (fasting). You may have this done every 1-3 years. Discuss your test results, treatment options, and if necessary, the need for more tests with your health care provider. Vaccines Your health care provider may recommend certain vaccines, such as:  Influenza vaccine. This is recommended every year.  Tetanus, diphtheria, and acellular pertussis (Tdap, Td) vaccine. You may need a Td booster every 10 years.  Varicella vaccine.  You may need this if you have not been vaccinated.  Zoster vaccine. You may need this after age 89.  Measles, mumps, and rubella (MMR) vaccine. You may need at least one dose of MMR if you were born in 1957 or later. You may also need a second dose.  Pneumococcal 13-valent conjugate (PCV13) vaccine. You  may need this if you have certain conditions and have not been vaccinated.  Pneumococcal polysaccharide (PPSV23) vaccine. You may need one or two doses if you smoke cigarettes or if you have certain conditions.  Meningococcal vaccine. You may need this if you have certain conditions.  Hepatitis A vaccine. You may need this if you have certain conditions or if you travel or work in places where you may be exposed to hepatitis A.  Hepatitis B vaccine. You may need this if you have certain conditions or if you travel or work in places where you may be exposed to hepatitis B.  Haemophilus influenzae type b (Hib) vaccine. You may need this if you have certain risk factors. Talk to your health care provider about which screenings and vaccines you need and how often you need them. This information is not intended to replace advice given to you by your health care provider. Make sure you discuss any questions you have with your health care provider. Document Released: 11/27/2015 Document Revised: 12/21/2017 Document Reviewed: 09/01/2015 Elsevier Interactive Patient Education  2019 [employer].

## 2019-01-05 LAB — COMPREHENSIVE METABOLIC PANEL
AG RATIO: 1.3 (calc) (ref 1.0–2.5)
ALKALINE PHOSPHATASE (APISO): 53 U/L (ref 35–144)
ALT: 47 U/L — ABNORMAL HIGH (ref 9–46)
AST: 28 U/L (ref 10–35)
Albumin: 4.6 g/dL (ref 3.6–5.1)
BILIRUBIN TOTAL: 0.7 mg/dL (ref 0.2–1.2)
BUN: 15 mg/dL (ref 7–25)
CO2: 27 mmol/L (ref 20–32)
Calcium: 10.5 mg/dL — ABNORMAL HIGH (ref 8.6–10.3)
Chloride: 100 mmol/L (ref 98–110)
Creat: 1.26 mg/dL (ref 0.70–1.33)
Globulin: 3.5 g/dL (calc) (ref 1.9–3.7)
Glucose, Bld: 82 mg/dL (ref 65–99)
Potassium: 4 mmol/L (ref 3.5–5.3)
Sodium: 138 mmol/L (ref 135–146)
Total Protein: 8.1 g/dL (ref 6.1–8.1)

## 2019-01-05 LAB — CBC WITH DIFFERENTIAL/PLATELET
Absolute Monocytes: 601 cells/uL (ref 200–950)
Basophils Absolute: 59 cells/uL (ref 0–200)
Basophils Relative: 0.9 %
Eosinophils Absolute: 119 cells/uL (ref 15–500)
Eosinophils Relative: 1.8 %
HCT: 41.4 % (ref 38.5–50.0)
HEMOGLOBIN: 15 g/dL (ref 13.2–17.1)
Lymphs Abs: 3109 cells/uL (ref 850–3900)
MCH: 33.9 pg — ABNORMAL HIGH (ref 27.0–33.0)
MCHC: 36.2 g/dL — ABNORMAL HIGH (ref 32.0–36.0)
MCV: 93.7 fL (ref 80.0–100.0)
MPV: 10.6 fL (ref 7.5–12.5)
Monocytes Relative: 9.1 %
NEUTROS ABS: 2713 {cells}/uL (ref 1500–7800)
Neutrophils Relative %: 41.1 %
Platelets: 297 10*3/uL (ref 140–400)
RBC: 4.42 10*6/uL (ref 4.20–5.80)
RDW: 12.1 % (ref 11.0–15.0)
Total Lymphocyte: 47.1 %
WBC: 6.6 10*3/uL (ref 3.8–10.8)

## 2019-01-05 LAB — LIPID PANEL
Cholesterol: 163 mg/dL (ref <200)
HDL: 48 mg/dL (ref >=40)
LDL Cholesterol (Calc): 92 mg/dL (calc)
Non-HDL Cholesterol (Calc): 115 mg/dL (calc) (ref <130)
Total CHOL/HDL Ratio: 3.4 (calc) (ref <5.0)
Triglycerides: 134 mg/dL (ref <150)

## 2019-01-05 LAB — HEMOGLOBIN A1C
Hgb A1c MFr Bld: 5.7 % of total Hgb — ABNORMAL HIGH (ref <5.7)
Mean Plasma Glucose: 117 (calc)
eAG (mmol/L): 6.5 (calc)

## 2019-01-05 LAB — PSA: PSA: 1.2 ng/mL (ref <=4.0)

## 2019-01-09 ENCOUNTER — Telehealth: Payer: Self-pay | Admitting: *Deleted

## 2019-01-09 NOTE — Telephone Encounter (Signed)
Received Exercise Stress Echo Study Report results from [place]; forwarded to provider/SLS 02/26

## 2019-02-22 ENCOUNTER — Telehealth: Payer: Self-pay | Admitting: Medical

## 2019-02-22 NOTE — Telephone Encounter (Signed)
Copied from [place] [id number]. Topic: Quick Communication - Rx Refill/Question >> Feb 22, 2019  9:40 AM Rainey Pines A wrote: Medication: albuterol (PROAIR HFA) 108 (90 Base) MCG/ACT inhaler,triamterene-hydrochlorothiazide (DYAZIDE) 37.5-25 MG capsule   Has the patient contacted their pharmacy? Yes (Agent: If no, request that the patient contact the pharmacy for the refill.) (Agent: If yes, when and what did the pharmacy advise?)Contact PCP  Preferred Pharmacy (with phone number or street name): [facility], [place] [phone] (Phone) [phone] (Fax)    Agent: Please be advised that RX refills may take up to 3 business days. We ask that you follow-up with your pharmacy.

## 2019-02-25 ENCOUNTER — Other Ambulatory Visit: Payer: Self-pay | Admitting: Medical

## 2019-02-25 MED ORDER — ALBUTEROL SULFATE HFA 108 (90 BASE) MCG/ACT IN AERS: 2.0000 | INHALATION_SPRAY | Freq: Four times a day (QID) | RESPIRATORY_TRACT | 2 refills | Status: DC | PRN

## 2019-02-25 MED ORDER — AMLODIPINE BESYLATE 10 MG PO TABS: ORAL_TABLET | ORAL | 1 refills | Status: DC

## 2019-02-25 MED ORDER — TRIAMTERENE-HCTZ 37.5-25 MG PO CAPS: 1.0000 | ORAL_CAPSULE | Freq: Every morning | ORAL | 1 refills | Status: DC

## 2019-02-25 NOTE — Telephone Encounter (Signed)
Craig House -- I sent the triamterene RX but I do not see that you have albuterol for him before. Please advise?

## 2019-02-25 NOTE — Telephone Encounter (Signed)
Requested medication (s) are due for refill today:   Proair inhaler - no  Prescribed by a historical provider 01/25/2016;                                                                                    Amlodipine  Yes  LOV 01/04/2019 with Ila Mcgill  Requested medication (s) are on the active medication list:   Amlodipine - yes;   ProAir - No  Future visit scheduled:   No   Last ordered: ProAir inhaler 01/25/2016 by a historical provider;     Amlodipine 09/14/18  #90  0   Requested Prescriptions  Pending Prescriptions Disp Refills   albuterol (PROAIR HFA) 108 (90 Base) MCG/ACT inhaler      Sig: Inhale into the lungs.     Pulmonology:  Beta Agonists Failed - 02/25/2019 11:41 AM      Failed - One inhaler should last at least one month. If the patient is requesting refills earlier, contact the patient to check for uncontrolled symptoms.      Passed - Valid encounter within last 12 months    Recent Outpatient Visits          1 month ago Wellness examination   [facility] at [place], [place]   8 months ago Chronic pain of left knee   [facility] at [place], [place]   1 year ago Wellness examination   [facility] at [place], [place]   5 years ago Other malaise and fatigue   [facility] at [facility], Benn [place], MD   5 years ago Essential hypertension, benign   [facility], Janalee Dane, MD            amLODipine (NORVASC) 10 MG tablet 90 tablet 0     Cardiovascular:  Calcium Channel Blockers Failed - 02/25/2019 11:41 AM      Failed - Last BP in normal range    BP Readings from Last 1 Encounters:  01/04/19 (!) 148/90         Passed - Valid encounter within last 6 months    Recent Outpatient Visits          1 month ago Wellness examination   [facility] at [facility], [place], [place]   8 months  ago Chronic pain of left knee   [facility] at [place], [place]   1 year ago Wellness examination   [facility] at [place], [facility]   5 years ago Other malaise and fatigue   [facility] at [facility], Benn Moulder, MD   5 years ago Essential hypertension, benign   [facility], Janalee Dane, MD

## 2019-02-25 NOTE — Telephone Encounter (Signed)
Spoke with pt and he denies current symptoms. States he gets allergies every year and wanted to have albuterol on hand in case they flare up. Advised pt if symptoms develop to call and schedule a Virtual Visit and he voices understanding.

## 2019-02-25 NOTE — Telephone Encounter (Signed)
Rx albuterol sent in. But call pt and make sure he is stable and does not need virtual visit.

## 2019-03-17 ENCOUNTER — Other Ambulatory Visit: Payer: Self-pay | Admitting: Medical

## 2019-04-29 ENCOUNTER — Other Ambulatory Visit: Payer: Self-pay | Admitting: Medical

## 2019-06-17 ENCOUNTER — Other Ambulatory Visit: Payer: Self-pay | Admitting: Medical

## 2019-09-25 ENCOUNTER — Other Ambulatory Visit: Payer: Self-pay

## 2019-09-25 MED ORDER — LOSARTAN POTASSIUM 25 MG PO TABS: ORAL_TABLET | ORAL | 3 refills | Status: DC

## 2019-09-25 MED ORDER — ATORVASTATIN CALCIUM 20 MG PO TABS: ORAL_TABLET | ORAL | 0 refills | Status: DC

## 2019-09-25 MED ORDER — AMLODIPINE BESYLATE 10 MG PO TABS: ORAL_TABLET | ORAL | 1 refills | Status: DC

## 2019-10-09 ENCOUNTER — Encounter: Payer: Self-pay | Admitting: Medical

## 2019-10-09 ENCOUNTER — Other Ambulatory Visit: Payer: Self-pay

## 2019-10-09 ENCOUNTER — Ambulatory Visit (INDEPENDENT_AMBULATORY_CARE_PROVIDER_SITE_OTHER): Payer: BC Managed Care – PPO | Admitting: Medical

## 2019-10-09 VITALS — BP 155/80 | HR 73 | Temp 97.0°F | Resp 16 | Ht 67.0 in | Wt 198.8 lb

## 2019-10-09 DIAGNOSIS — Z23 Encounter for immunization: Secondary | ICD-10-CM

## 2019-10-09 DIAGNOSIS — R5383 Other fatigue: Secondary | ICD-10-CM | POA: Diagnosis not present

## 2019-10-09 DIAGNOSIS — I10 Essential (primary) hypertension: Secondary | ICD-10-CM | POA: Diagnosis not present

## 2019-10-09 DIAGNOSIS — J45909 Unspecified asthma, uncomplicated: Secondary | ICD-10-CM

## 2019-10-09 DIAGNOSIS — E785 Hyperlipidemia, unspecified: Secondary | ICD-10-CM

## 2019-10-09 DIAGNOSIS — R739 Hyperglycemia, unspecified: Secondary | ICD-10-CM | POA: Diagnosis not present

## 2019-10-09 DIAGNOSIS — E669 Obesity, unspecified: Secondary | ICD-10-CM

## 2019-10-09 LAB — COMPREHENSIVE METABOLIC PANEL
ALT: 45 U/L (ref 0–53)
AST: 26 U/L (ref 0–37)
Albumin: 4.1 g/dL (ref 3.5–5.2)
Alkaline Phosphatase: 56 U/L (ref 39–117)
BUN: 17 mg/dL (ref 6–23)
CO2: 26 mEq/L (ref 19–32)
Calcium: 9.8 mg/dL (ref 8.4–10.5)
Chloride: 101 mEq/L (ref 96–112)
Creatinine, Ser: 1.07 mg/dL (ref 0.40–1.50)
GFR: 85.33 mL/min (ref >60.00)
Glucose, Bld: 116 mg/dL — ABNORMAL HIGH (ref 70–99)
Potassium: 4.1 mEq/L (ref 3.5–5.1)
Sodium: 135 mEq/L (ref 135–145)
Total Bilirubin: 0.8 mg/dL (ref 0.2–1.2)
Total Protein: 7.6 g/dL (ref 6.0–8.3)

## 2019-10-09 LAB — LIPID PANEL
Cholesterol: 151 mg/dL (ref 0–200)
HDL: 42.4 mg/dL (ref >39.00)
LDL Cholesterol: 87 mg/dL (ref 0–99)
NonHDL: 108.11
Total CHOL/HDL Ratio: 4
Triglycerides: 105 mg/dL (ref 0.0–149.0)
VLDL: 21 mg/dL (ref 0.0–40.0)

## 2019-10-09 LAB — HEMOGLOBIN A1C: Hgb A1c MFr Bld: 6.4 % (ref 4.6–6.5)

## 2019-10-09 LAB — TSH: TSH: 1.23 u[IU]/mL (ref 0.35–4.50)

## 2019-10-09 MED ORDER — TRIAMTERENE-HCTZ 37.5-25 MG PO CAPS: 1.0000 | ORAL_CAPSULE | Freq: Every morning | ORAL | 3 refills | Status: DC

## 2019-10-09 MED ORDER — BECLOMETHASONE DIPROP HFA 40 MCG/ACT IN AERB: 2.0000 | INHALATION_SPRAY | Freq: Two times a day (BID) | RESPIRATORY_TRACT | 3 refills | Status: DC

## 2019-10-09 NOTE — Patient Instructions (Addendum)
Your blood pressure is little bit elevated today and I do think overall it is best that your blood pressure be closer to 130/80.  Would recommend that she continue amlodipine and diuretic.  I do think adding on losartan is a good idea if blood pressure not tightly controlled.  If you are successful with weight loss this might help drop your blood pressure closer to 130/80 consistently.  For high cholesterol, continue with current statin medication and will check your metabolic panel and lipid panel today.  For history of elevated sugar in the past, will repeat A1c today.  For desired weight loss, I do think trying the weight watchers app would be beneficial.  For history of asthma, I do think you are using albuterol too frequently.  So continue Singulair and will add Qvar steroid inhaler.  For described mild fatigue, did place order for TSH.  Low thyroid can be because of fatigue and will assess her metabolism.  For knee pain, will have you recheck to orthopedist to see when surgery would be planned.  Then we can do the preop surgical clearance exam.  You can continue Voltaren.  Can fill out handicap application form for you pending surgery as well as to help you during postop.  Follow-up in 3 months or as needed

## 2019-10-09 NOTE — Progress Notes (Signed)
Subjective:    Patient ID: Craig House, male    DOB: 04/23/1961, 58 y.o.   MRN: [id number]  HPI  Pt in for follow up.  Pt is fasting today.  Pt bp initially high today. Pt states when he checks at home bp is better. States usually blood pressure [id number] systolic. Most of time diastolic closer to 80. Pt is on amlodipine. Pt is still dyazide. He never took losartan due to package insert concerns.  Pt states has been eating healthy. Avoiding fried foods.   Pt reports mild fatigue at end of the day.   Pt has hx of asthma. He states using inhaler about 3-4 times a week. He does take singulair at night.   Knee pain. He is going to get surgery eventually.    Review of Systems  Constitutional: Positive for fatigue. Negative for chills and fever.       See hpi  Respiratory: Negative for cough, chest tightness, shortness of breath and wheezing.        See hpi.   Cardiovascular: Negative for chest pain and palpitations.  Gastrointestinal: Negative for abdominal pain, blood in stool, nausea and vomiting.  Genitourinary: Negative for dysuria and enuresis.  Musculoskeletal: Negative for back pain and myalgias.       See hpi.  Skin: Negative for rash.  Neurological: Negative for dizziness, speech difficulty, weakness and headaches.  Psychiatric/Behavioral: Negative for behavioral problems, decreased concentration and sleep disturbance. The patient is not nervous/anxious.     Past Medical History:  Diagnosis Date  . Allergy    spring  . Anemia   . Asthma   . Hyperplastic colon polyp   . Hypertension   . Obesity      Social History   Socioeconomic History  . Marital status: Divorced    Spouse name: Not on file  . Number of children: Not on file  . Years of education: Not on file  . Highest education level: Not on file  Occupational History  . Not on file  Social Needs  . Financial resource strain: Not on file  . Food insecurity    Worry: Not on file    Inability:  Not on file  . Transportation needs    Medical: Not on file    Non-medical: Not on file  Tobacco Use  . Smoking status: Former Research scientist (life sciences)  . Smokeless tobacco: Never Used  Substance and Sexual Activity  . Alcohol use: Yes    Alcohol/week: 1.0 standard drinks    Types: 1 Standard drinks or equivalent per week    Comment: 1 glass of red wine at night  . Drug use: No  . Sexual activity: Yes  Lifestyle  . Physical activity    Days per week: Not on file    Minutes per session: Not on file  . Stress: Not on file  Relationships  . Social Herbalist on phone: Not on file    Gets together: Not on file    Attends religious service: Not on file    Active member of club or organization: Not on file    Attends meetings of clubs or organizations: Not on file    Relationship status: Not on file  . Intimate partner violence    Fear of current or ex partner: Not on file    Emotionally abused: Not on file    Physically abused: Not on file    Forced sexual activity: Not on file  Other Topics  Concern  . Not on file  Social History Narrative  . Not on file    Past Surgical History:  Procedure Laterality Date  . KNEE ARTHROSCOPY Bilateral     Family History  Problem Relation Age of Onset  . Heart attack Mother   . Cancer Father   . Heart disease Father   . Colon cancer Neg Hx     No Known Allergies  Current Outpatient Medications on File Prior to Visit  Medication Sig Dispense Refill  . albuterol (PROAIR HFA) 108 (90 Base) MCG/ACT inhaler Inhale into the lungs.    Marland Kitchen albuterol (PROVENTIL HFA;VENTOLIN HFA) 108 (90 Base) MCG/ACT inhaler Inhale 2 puffs into the lungs every 6 (six) hours as needed for wheezing or shortness of breath. Can give proair 18 g 2  . amLODipine (NORVASC) 10 MG tablet TAKE 1 TABLET(10 MG) BY MOUTH DAILY 90 tablet 1  . aspirin EC 81 MG tablet Take by mouth.    Marland Kitchen atorvastatin (LIPITOR) 20 MG tablet TAKE 1 TABLET(20 MG) BY MOUTH DAILY 90 tablet 0  .  Iron-Vitamin C 65-125 MG TABS Take by mouth.    . losartan (COZAAR) 25 MG tablet TAKE 1 TABLET(25 MG) BY MOUTH DAILY 30 tablet 3  . naproxen sodium (ANAPROX) 220 MG tablet Take 440 mg by mouth 2 (two) times daily as needed.    . triamterene-hydrochlorothiazide (DYAZIDE) 37.5-25 MG capsule Take 1 each (1 capsule total) by mouth every morning. 90 capsule 1  . UNABLE TO FIND Med Name: Tumeric root oral    . Multiple Vitamin (MULTI-VITAMINS) TABS Take by mouth.     No current facility-administered medications on file prior to visit.     BP (!) 155/80   Pulse 73   Temp (!) 97 F (36.1 C) (Temporal)   Resp 16   Ht 5\' 7"  (1.702 m)   Wt 198 lb 12.8 oz (90.2 kg)   SpO2 100%   BMI 31.14 kg/m     Objective:   Physical Exam  General Mental Status- Alert. General Appearance- Not in acute distress.   Skin General: Color- Normal Color. Moisture- Normal Moisture.  Neck Carotid Arteries- Normal color. Moisture- Normal Moisture. No carotid bruits. No JVD.  Chest and Lung Exam Auscultation: Breath Sounds:-Normal.  Cardiovascular Auscultation:Rythm- Regular. Murmurs & Other Heart Sounds:Auscultation of the heart reveals- No Murmurs.  Abdomen Inspection:-Inspeection Normal. Palpation/Percussion:Note:No mass. Palpation and Percussion of the abdomen reveal- Non Tender, Non Distended + BS, no rebound or guarding.  Neurologic Cranial Nerve exam:- CN III-XII intact(No nystagmus), symmetric smile. Strength:- 5/5 equal and symmetric strength both upper and lower extremities.      Assessment & Plan:  Your blood pressure is little bit elevated today and I do think overall it is best that your blood pressure be closer to 130/80.  Would recommend that she continue amlodipine and diuretic.  I do think adding on losartan is a good idea if blood pressure not tightly controlled.  If you are successful with weight loss this might help drop your blood pressure closer to 130/80 consistently.  For  high cholesterol, continue with current statin medication and will check your metabolic panel and lipid panel today.  For history of elevated sugar in the past, will repeat A1c today.  For desired weight loss, I do think trying the weight watchers app would be beneficial.  For history of asthma, I do think you are using albuterol too frequently.  So continue Singulair and will add Qvar steroid inhaler.  For described mild fatigue, did place order for TSH.  Low thyroid can be because of fatigue and will assess her metabolism.  For knee pain, will have you recheck to orthopedist to see when surgery would be planned.  Then we can do the preop surgical clearance exam.  You can continue Voltaren.  Can fill out handicap application form for you pending surgery as well as to help you during postop.  Follow-up in 3 months or as needed  40 minutes spent with patient.  50% of time spent counseling patient on plan going forward.  Also answered patient's questions.  Mackie Pai, PA-C

## 2019-10-20 ENCOUNTER — Telehealth: Payer: Self-pay | Admitting: Medical

## 2019-10-20 MED ORDER — METFORMIN HCL 500 MG PO TABS: 500.0000 mg | ORAL_TABLET | Freq: Two times a day (BID) | ORAL | 3 refills | Status: DC

## 2019-10-20 NOTE — Telephone Encounter (Signed)
Rx metformin sent to pt pharmacy. 

## 2019-10-29 IMAGING — DX DG KNEE COMPLETE 4+V*L*
4 series · 4 of 4 positions shown · non-contrast
Comparison: Left knee radiograph 12/31/2011

CLINICAL DATA: Left knee pain

EXAM:
LEFT KNEE - COMPLETE 4+ VIEW

[knee ap]
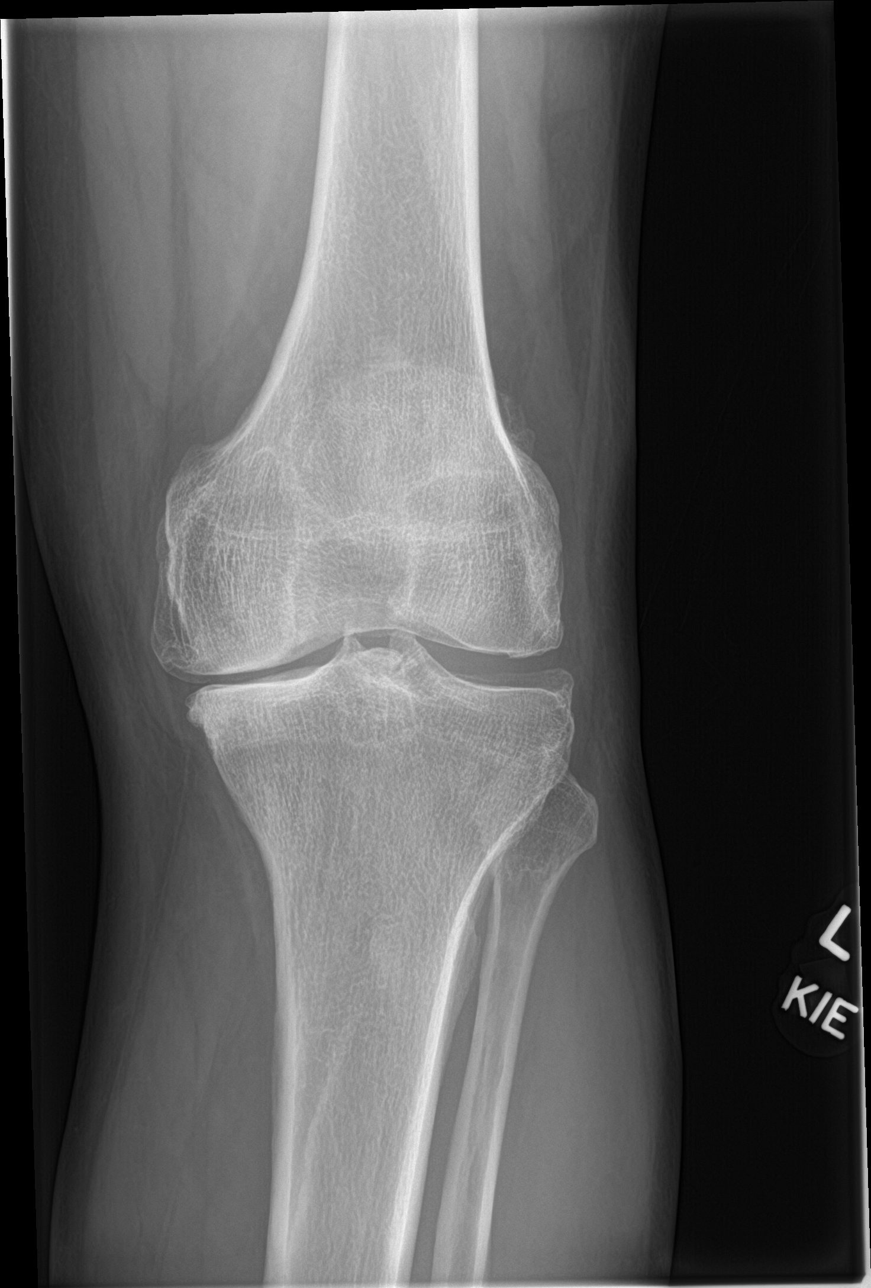

[knee lat]
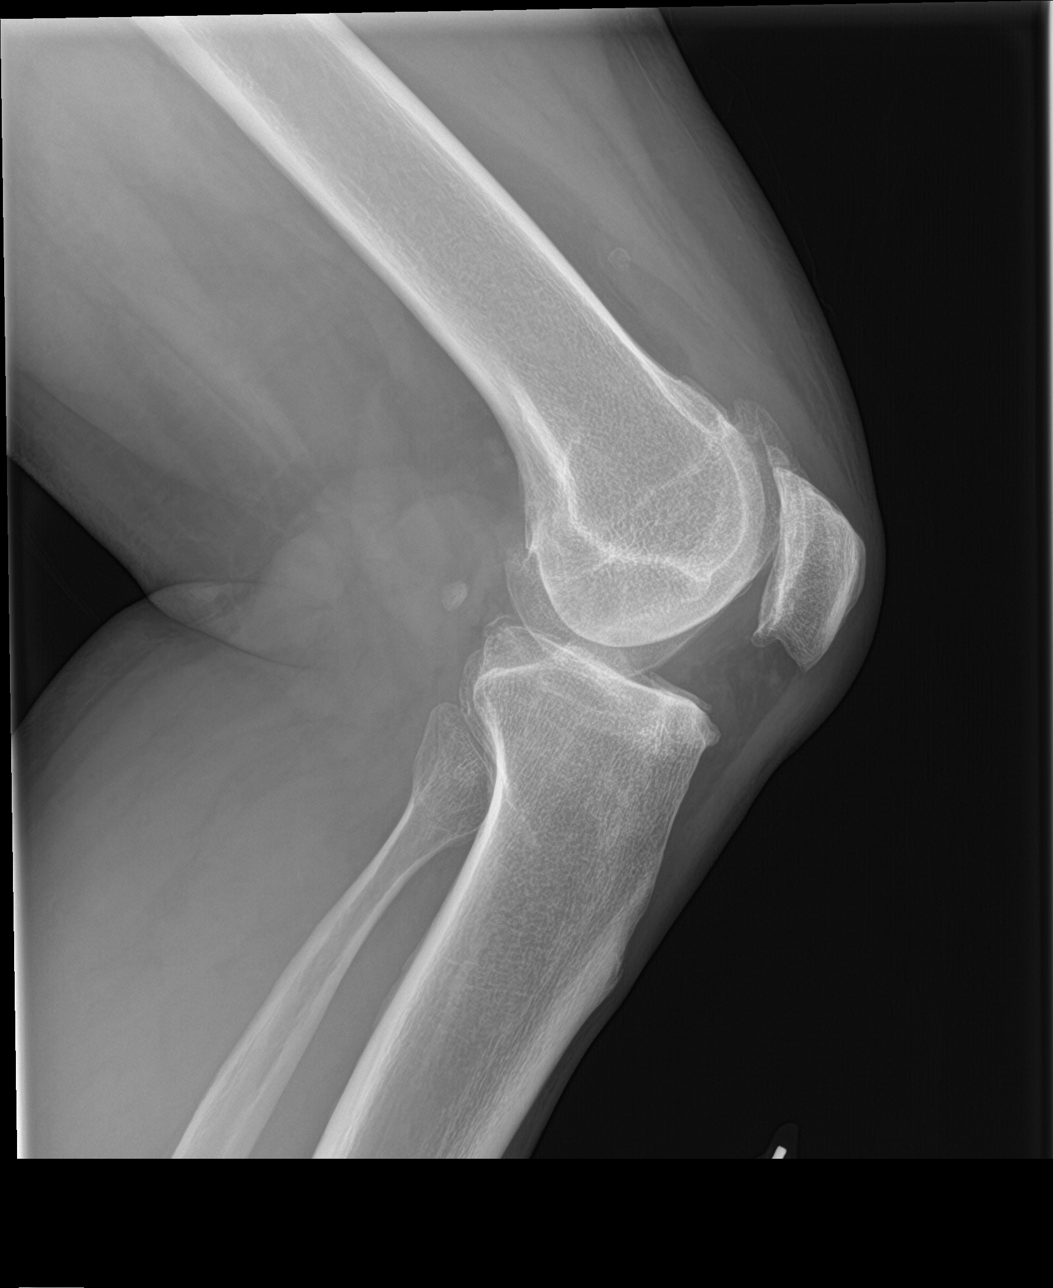

[knee obl (1 of 2)]
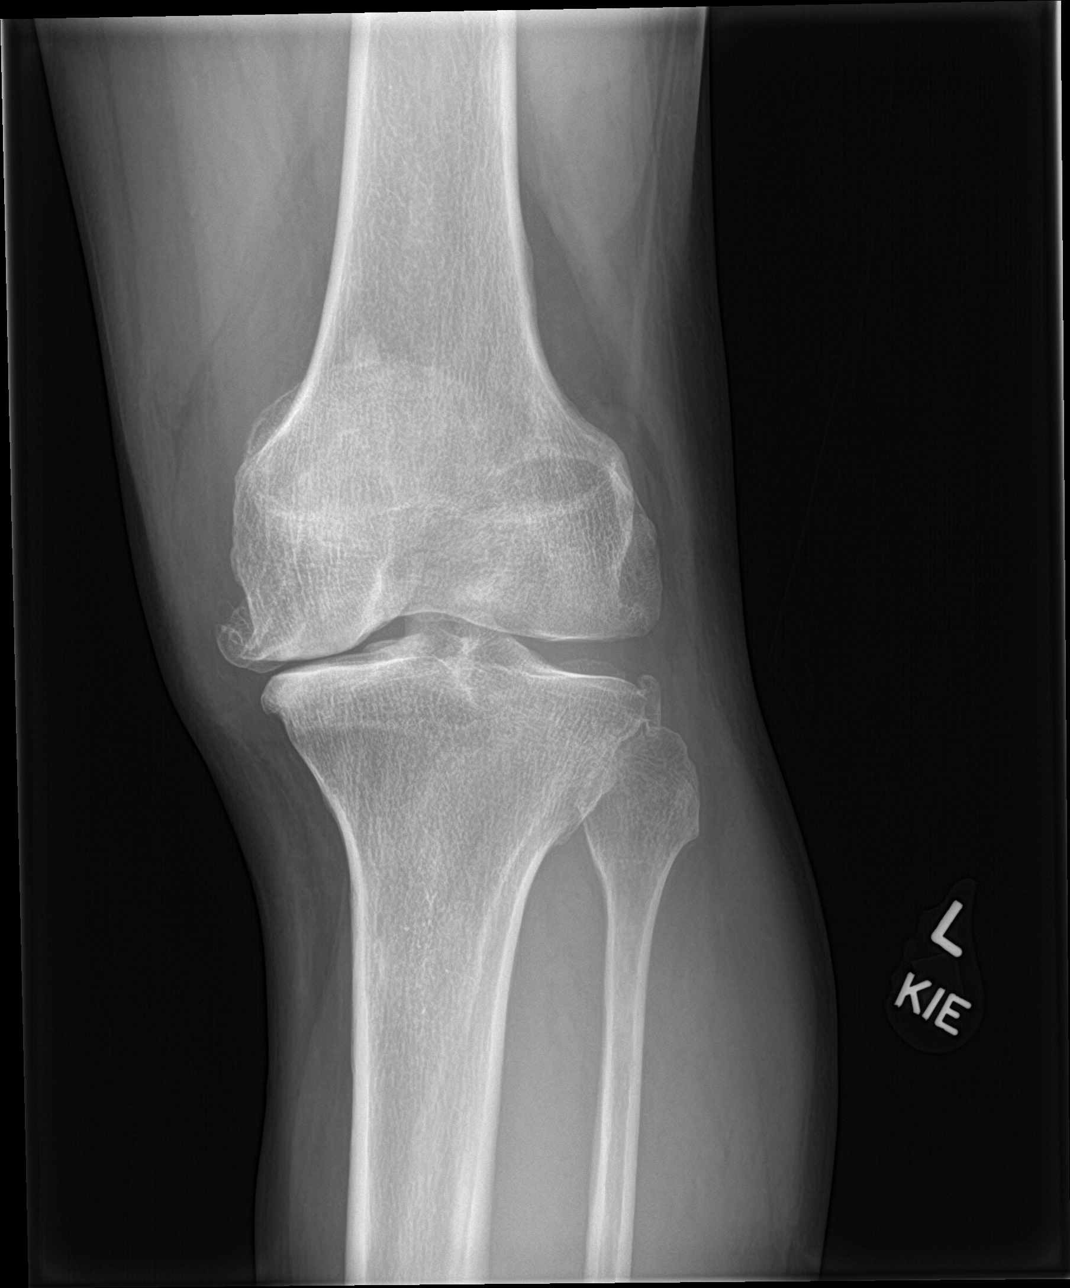

[knee obl (2 of 2)]
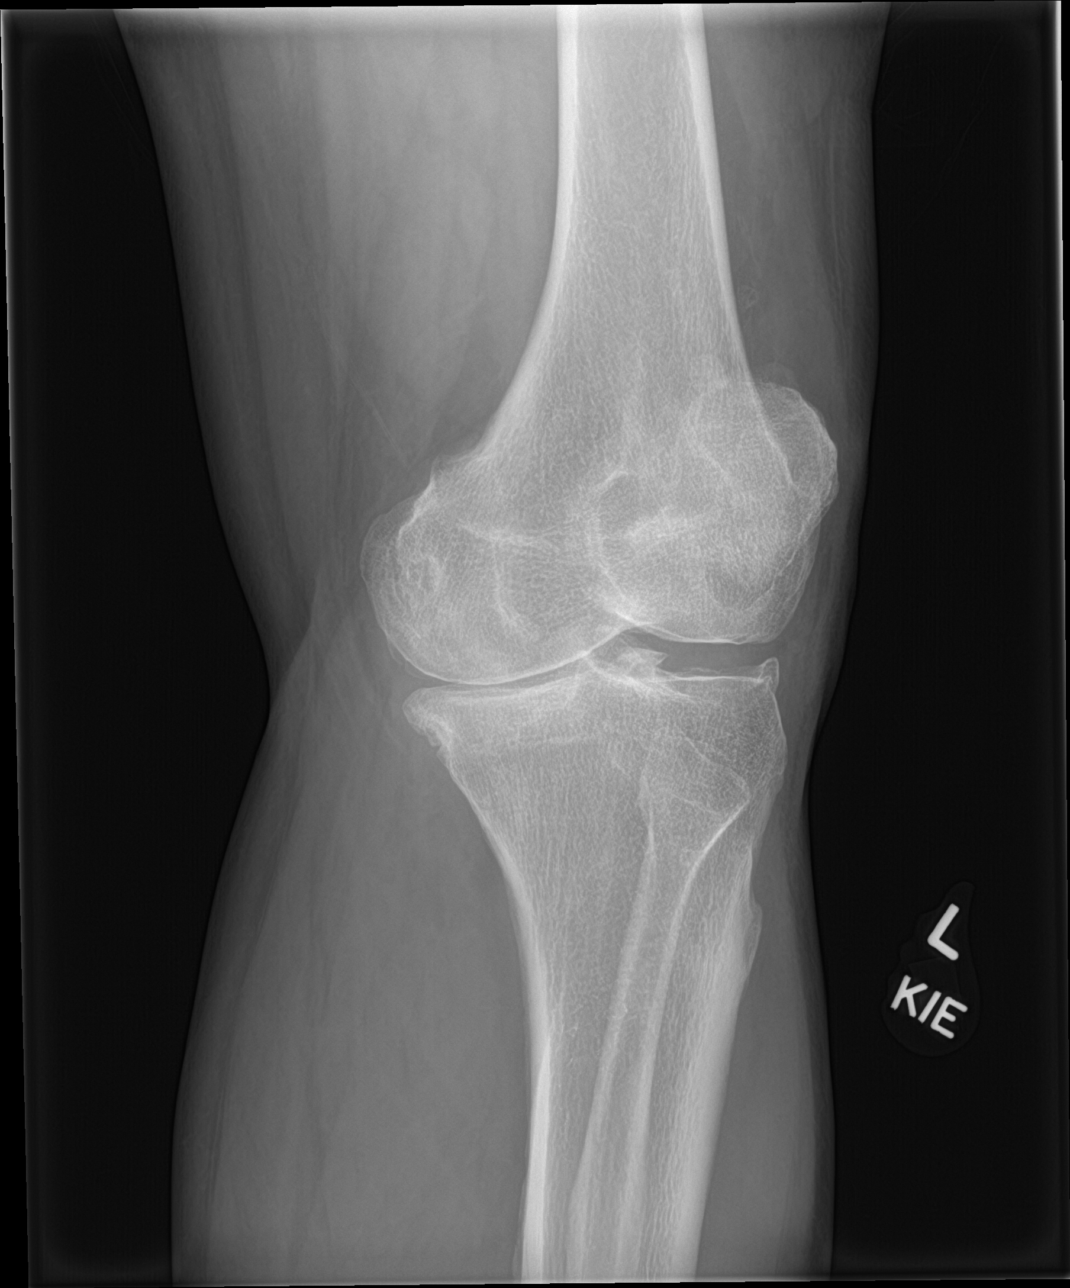

[4 of 4 positions shown; findings below may reference images not displayed]

FINDINGS: Progression of medial femorotibial joint space narrowing with
marginal osteophyte formation, now moderate. Lateral femorotibial
joint space is preserved. Patellofemoral joint space narrowing has
also worsened with progressive osteophytosis. No knee effusion.
IMPRESSION: Progression of patellofemoral and medial compartmental
osteoarthrosis, now moderate. No acute finding.

## 2019-12-15 ENCOUNTER — Other Ambulatory Visit: Payer: Self-pay | Admitting: Medical

## 2020-01-09 ENCOUNTER — Ambulatory Visit (INDEPENDENT_AMBULATORY_CARE_PROVIDER_SITE_OTHER): Payer: BC Managed Care – PPO | Admitting: Medical

## 2020-01-09 ENCOUNTER — Other Ambulatory Visit: Payer: Self-pay

## 2020-01-09 VITALS — BP 138/74 | HR 78 | Temp 96.4°F | Resp 18 | Ht 67.0 in | Wt 196.8 lb

## 2020-01-09 DIAGNOSIS — I10 Essential (primary) hypertension: Secondary | ICD-10-CM | POA: Diagnosis not present

## 2020-01-09 DIAGNOSIS — G8929 Other chronic pain: Secondary | ICD-10-CM

## 2020-01-09 DIAGNOSIS — M25561 Pain in right knee: Secondary | ICD-10-CM

## 2020-01-09 DIAGNOSIS — Z125 Encounter for screening for malignant neoplasm of prostate: Secondary | ICD-10-CM | POA: Diagnosis not present

## 2020-01-09 DIAGNOSIS — Z6830 Body mass index (BMI) 30.0-30.9, adult: Secondary | ICD-10-CM

## 2020-01-09 DIAGNOSIS — E785 Hyperlipidemia, unspecified: Secondary | ICD-10-CM

## 2020-01-09 DIAGNOSIS — N529 Male erectile dysfunction, unspecified: Secondary | ICD-10-CM

## 2020-01-09 DIAGNOSIS — E669 Obesity, unspecified: Secondary | ICD-10-CM

## 2020-01-09 DIAGNOSIS — E119 Type 2 diabetes mellitus without complications: Secondary | ICD-10-CM | POA: Diagnosis not present

## 2020-01-09 DIAGNOSIS — M25562 Pain in left knee: Secondary | ICD-10-CM

## 2020-01-09 DIAGNOSIS — E66811 Obesity, class 1: Secondary | ICD-10-CM

## 2020-01-09 LAB — LIPID PANEL
Cholesterol: 143 mg/dL (ref 0–200)
HDL: 35.4 mg/dL — ABNORMAL LOW (ref >39.00)
LDL Cholesterol: 83 mg/dL (ref 0–99)
NonHDL: 107.45
Total CHOL/HDL Ratio: 4
Triglycerides: 122 mg/dL (ref 0.0–149.0)
VLDL: 24.4 mg/dL (ref 0.0–40.0)

## 2020-01-09 LAB — COMPREHENSIVE METABOLIC PANEL
ALT: 76 U/L — ABNORMAL HIGH (ref 0–53)
AST: 36 U/L (ref 0–37)
Albumin: 4.4 g/dL (ref 3.5–5.2)
Alkaline Phosphatase: 55 U/L (ref 39–117)
BUN: 25 mg/dL — ABNORMAL HIGH (ref 6–23)
CO2: 29 mEq/L (ref 19–32)
Calcium: 10.1 mg/dL (ref 8.4–10.5)
Chloride: 98 mEq/L (ref 96–112)
Creatinine, Ser: 1.22 mg/dL (ref 0.40–1.50)
GFR: 73.28 mL/min (ref >60.00)
Glucose, Bld: 135 mg/dL — ABNORMAL HIGH (ref 70–99)
Potassium: 4.1 mEq/L (ref 3.5–5.1)
Sodium: 135 mEq/L (ref 135–145)
Total Bilirubin: 0.8 mg/dL (ref 0.2–1.2)
Total Protein: 7.9 g/dL (ref 6.0–8.3)

## 2020-01-09 LAB — PSA: PSA: 1.49 ng/mL (ref 0.10–4.00)

## 2020-01-09 MED ORDER — SILDENAFIL CITRATE 50 MG PO TABS: ORAL_TABLET | ORAL | 0 refills | Status: DC

## 2020-01-09 NOTE — Patient Instructions (Addendum)
For htn, continue current medication.   For high cholesterol will check cmp and lipid panel today. See if lipid still controlled.  For elevated sugar, I will get a1c today. Continue metformin and see if changes need to be made after a1 review.  Will get screening psa today.  For knee pain/severe degenerative changes follow up with orthopedist.  Good job on weight loss. Continue weight watchers.  For ED rx viagra.   Follow up 3 months or as needed

## 2020-01-09 NOTE — Addendum Note (Signed)
Addended by: Anabel Halon on: 01/09/2020 09:49 AM   Modules accepted: Orders

## 2020-01-09 NOTE — Progress Notes (Signed)
Subjective:    Patient ID: Craig House, male    DOB: [birth year], 59 y.o.   MRN: [id number]  HPI  Pt in for follow up.  Pt since last visit started weight watchers. Pt did loose down to 185 lb but did start at 198 lb.  Pt bp is adequatley controlled today.  Last a1c was 6.4.  Pt has high cholesterol but last level controlled. On lipitor.     Review of Systems  Constitutional: Negative for chills, fatigue and fever.  HENT: Negative for congestion, ear discharge, sinus pressure, sinus pain and sore throat.   Respiratory: Negative for cough, chest tightness, shortness of breath and wheezing.   Cardiovascular: Negative for chest pain and palpitations.  Gastrointestinal: Negative for abdominal pain, blood in stool, constipation, diarrhea and vomiting.  Genitourinary: Negative for dysuria, flank pain and frequency.  Musculoskeletal: Negative for back pain.  Skin: Negative for rash.  Neurological: Negative for dizziness and headaches.  Hematological: Negative for adenopathy. Does not bruise/bleed easily.     Past Medical History:  Diagnosis Date  . Allergy    spring  . Anemia   . Asthma   . Hyperplastic colon polyp   . Hypertension   . Obesity      Social History   Socioeconomic History  . Marital status: Divorced    Spouse name: Not on file  . Number of children: Not on file  . Years of education: Not on file  . Highest education level: Not on file  Occupational History  . Not on file  Tobacco Use  . Smoking status: Former Research scientist (life sciences)  . Smokeless tobacco: Never Used  Substance and Sexual Activity  . Alcohol use: Yes    Alcohol/week: 1.0 standard drinks    Types: 1 Standard drinks or equivalent per week    Comment: 1 glass of red wine at night  . Drug use: No  . Sexual activity: Yes  Other Topics Concern  . Not on file  Social History Narrative  . Not on file   Social Determinants of Health   Financial Resource Strain:   . Difficulty of Paying  Living Expenses: Not on file  Food Insecurity:   . Worried About Charity fundraiser in the Last Year: Not on file  . Ran Out of Food in the Last Year: Not on file  Transportation Needs:   . Lack of Transportation (Medical): Not on file  . Lack of Transportation (Non-Medical): Not on file  Physical Activity:   . Days of Exercise per Week: Not on file  . Minutes of Exercise per Session: Not on file  Stress:   . Feeling of Stress : Not on file  Social Connections:   . Frequency of Communication with Friends and Family: Not on file  . Frequency of Social Gatherings with Friends and Family: Not on file  . Attends Religious Services: Not on file  . Active Member of Clubs or Organizations: Not on file  . Attends Archivist Meetings: Not on file  . Marital Status: Not on file  Intimate Partner Violence:   . Fear of Current or Ex-Partner: Not on file  . Emotionally Abused: Not on file  . Physically Abused: Not on file  . Sexually Abused: Not on file    Past Surgical History:  Procedure Laterality Date  . KNEE ARTHROSCOPY Bilateral     Family History  Problem Relation Age of Onset  . Heart attack Mother   . Cancer  Father   . Heart disease Father   . Colon cancer Neg Hx     No Known Allergies  Current Outpatient Medications on File Prior to Visit  Medication Sig Dispense Refill  . albuterol (PROAIR HFA) 108 (90 Base) MCG/ACT inhaler Inhale into the lungs.    Marland Kitchen albuterol (PROVENTIL HFA;VENTOLIN HFA) 108 (90 Base) MCG/ACT inhaler Inhale 2 puffs into the lungs every 6 (six) hours as needed for wheezing or shortness of breath. Can give proair 18 g 2  . amLODipine (NORVASC) 10 MG tablet TAKE 1 TABLET(10 MG) BY MOUTH DAILY 90 tablet 1  . aspirin EC 81 MG tablet Take by mouth.    Marland Kitchen atorvastatin (LIPITOR) 20 MG tablet TAKE 1 TABLET(20 MG) BY MOUTH DAILY 90 tablet 1  . beclomethasone (QVAR) 40 MCG/ACT inhaler Inhale 2 puffs into the lungs 2 (two) times daily. 10.6 g 3  .  Iron-Vitamin C 65-125 MG TABS Take by mouth.    . losartan (COZAAR) 25 MG tablet TAKE 1 TABLET(25 MG) BY MOUTH DAILY 30 tablet 3  . metFORMIN (GLUCOPHAGE) 500 MG tablet Take 1 tablet (500 mg total) by mouth 2 (two) times daily with a meal. 60 tablet 3  . Multiple Vitamin (MULTI-VITAMINS) TABS Take by mouth.    . naproxen sodium (ANAPROX) 220 MG tablet Take 440 mg by mouth 2 (two) times daily as needed.    . triamterene-hydrochlorothiazide (DYAZIDE) 37.5-25 MG capsule Take 1 each (1 capsule total) by mouth every morning. 90 capsule 3  . UNABLE TO FIND Med Name: Tumeric root oral     No current facility-administered medications on file prior to visit.    BP 138/74 (BP Location: Left Arm, Patient Position: Sitting, Cuff Size: Large)   Pulse 78   Temp (!) 96.4 F (35.8 C) (Temporal)   Resp 18   Ht 5\' 7"  (1.702 m)   Wt 196 lb 12.8 oz (89.3 kg)   SpO2 96%   BMI 30.82 kg/m       Objective:   Physical Exam  General Mental Status- Alert. General Appearance- Not in acute distress.   Skin General: Color- Normal Color. Moisture- Normal Moisture.  Neck Carotid Arteries- Normal color. Moisture- Normal Moisture. No carotid bruits. No JVD.  Chest and Lung Exam Auscultation: Breath Sounds:-Normal.  Cardiovascular Auscultation:Rythm- Regular. Murmurs & Other Heart Sounds:Auscultation of the heart reveals- No Murmurs.  Abdomen Inspection:-Inspeection Normal. Palpation/Percussion:Note:No mass. Palpation and Percussion of the abdomen reveal- Non Tender, Non Distended + BS, no rebound or guarding.   Neurologic Cranial Nerve exam:- CN III-XII intact(No nystagmus), symmetric smile. Strength:- 5/5 equal and symmetric strength both upper and lower extremities.      Assessment & Plan:  For htn, continue current medication.   For high cholesterol will check cmp and lipid panel today. See if lipid still controlled.  For elevated sugar, I will get a1c today. Continue metformin and see  if changes need to be made after a1 review.  Will get screening psa today.  For knee pain/severe degenerative changes follow up with orthopedist.  Good job on weight loss. Continue weight watchers.  Follow up 3 months or as needed  30  minutes spent with pt.    Mackie Pai, PA-C

## 2020-01-10 LAB — HEMOGLOBIN A1C: Hgb A1c MFr Bld: 5.9 % (ref 4.6–6.5)

## 2020-01-14 ENCOUNTER — Other Ambulatory Visit: Payer: Self-pay

## 2020-01-14 MED ORDER — TRIAMTERENE-HCTZ 37.5-25 MG PO CAPS: 1.0000 | ORAL_CAPSULE | Freq: Every morning | ORAL | 3 refills | Status: DC

## 2020-04-03 ENCOUNTER — Other Ambulatory Visit: Payer: Self-pay

## 2020-04-03 ENCOUNTER — Encounter: Payer: Self-pay | Admitting: Medical

## 2020-04-03 ENCOUNTER — Ambulatory Visit (INDEPENDENT_AMBULATORY_CARE_PROVIDER_SITE_OTHER): Payer: BC Managed Care – PPO | Admitting: Medical

## 2020-04-03 VITALS — BP 130/78 | HR 80 | Temp 98.0°F | Resp 16 | Ht 67.0 in | Wt 197.2 lb

## 2020-04-03 DIAGNOSIS — E785 Hyperlipidemia, unspecified: Secondary | ICD-10-CM

## 2020-04-03 DIAGNOSIS — R739 Hyperglycemia, unspecified: Secondary | ICD-10-CM

## 2020-04-03 DIAGNOSIS — H60502 Unspecified acute noninfective otitis externa, left ear: Secondary | ICD-10-CM

## 2020-04-03 DIAGNOSIS — I10 Essential (primary) hypertension: Secondary | ICD-10-CM

## 2020-04-03 DIAGNOSIS — N529 Male erectile dysfunction, unspecified: Secondary | ICD-10-CM

## 2020-04-03 DIAGNOSIS — H9312 Tinnitus, left ear: Secondary | ICD-10-CM | POA: Diagnosis not present

## 2020-04-03 DIAGNOSIS — K635 Polyp of colon: Secondary | ICD-10-CM

## 2020-04-03 MED ORDER — ATORVASTATIN CALCIUM 20 MG PO TABS: ORAL_TABLET | ORAL | 3 refills | Status: DC

## 2020-04-03 MED ORDER — NEOMYCIN-POLYMYXIN-HC 3.5-10000-1 OT SOLN: 3.0000 [drp] | Freq: Four times a day (QID) | OTIC | 0 refills | Status: DC

## 2020-04-03 MED ORDER — SILDENAFIL CITRATE 20 MG PO TABS: ORAL_TABLET | ORAL | 0 refills | Status: DC

## 2020-04-03 MED ORDER — AMLODIPINE BESYLATE 10 MG PO TABS: ORAL_TABLET | ORAL | 3 refills | Status: DC

## 2020-04-03 MED ORDER — TRIAMTERENE-HCTZ 37.5-25 MG PO CAPS: 1.0000 | ORAL_CAPSULE | Freq: Every morning | ORAL | 3 refills | Status: DC

## 2020-04-03 NOTE — Patient Instructions (Addendum)
You had brief tinnitus and I think this was related to loud sounds at work. Continue to wear ear plugs. If tinnitus recurrent and not resolving let me know.  For left ear pain, I am prescribing cortisporin otic drops. On exam your ear canal looks inflamed.  For htn refill bp meds. Bp well controlled.  For elevated sugar recommend low sugar diet and get future a1c in early June.  For hx of high choleserol repeat lipid panel with cmp early June.  For ED rx generic viagra. Print rx. Try [facility].  Follow up date to be determined after lab review.

## 2020-04-03 NOTE — Progress Notes (Signed)
   Subjective:    Patient ID: Craig House, male    DOB: 1961-06-20, 59 y.o.   MRN: [id number]  HPI  Pt in for some ringing in his ears at times. At work he had had hearing test at work was was ok. Pt ringing in ears has stopped. 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  Ear canal hurts little. He uses ear plugs daily. He tried to wash out his ear at home. No wax came out.  Pt weight has been stable since last visit.  Pt bp controlled today.  Pt has had had mild elevated sugar elevation  the past.   Hx of mild low hdl.   Hx of ED. 100 mg viagra was too expensive.   Review of Systems  Constitutional: Negative for chills, fatigue and fever.  Respiratory: Negative for cough, chest tightness, shortness of breath and wheezing.   Cardiovascular: Negative for chest pain and palpitations.  Gastrointestinal: Negative for abdominal pain, blood in stool, diarrhea, nausea and vomiting.  Genitourinary:       ED  Musculoskeletal: Negative for back pain, joint swelling and neck stiffness.  Skin: Negative for rash.  Hematological: Negative for adenopathy. Does not bruise/bleed easily.  Psychiatric/Behavioral: Negative for behavioral problems, confusion and suicidal ideas. The patient is not nervous/anxious.        Objective:   Physical Exam  General Mental Status- Alert. General Appearance- Not in acute distress.   Skin General: Color- Normal Color. Moisture- Normal Moisture.  Neck Carotid Arteries- Normal color. Moisture- Normal Moisture. No carotid bruits. No JVD.  Chest and Lung Exam Auscultation: Breath Sounds:-Normal.  Cardiovascular Auscultation:Rythm- Regular. Murmurs & Other Heart Sounds:Auscultation of the heart reveals- No Murmurs.  Abdomen Inspection:-Inspeection Normal. Palpation/Percussion:Note:No mass. Palpation and Percussion of the abdomen reveal- Non Tender, Non Distended + BS, no rebound or  guarding.    Neurologic Cranial Nerve exam:- CN III-XII intact(No nystagmus), symmetric smile. Strength:- 5/5 equal and symmetric strength both upper and lower extremities.      Assessment & Plan:  You had brief tinnitus and I think this was related to loud sounds at work. Continue to wear ear plugs. If tinnitus recurrent and not resolving let me know.  For left ear pain, I am prescribing cortisporin otic drops. On exam your ear canal looks inflamed.  For htn refill bp meds. Bp well controlled.  For elevated sugar recommend low sugar diet and get future a1c in early June.  For hx of high choleserol repeat lipid panel with cmp early June.  For ED rx generic viagra. Print rx. Try [facility].  Follow up date to be determined after lab review.  Mackie Pai, PA-C   Time spent with patient today was 30   minutes which consisted of chart review, discussing diagnosie, work up, treatment, answering question and documentation.

## 2020-04-10 ENCOUNTER — Ambulatory Visit: Payer: BC Managed Care – PPO | Admitting: Medical

## 2020-04-20 ENCOUNTER — Other Ambulatory Visit: Payer: Self-pay

## 2020-04-20 ENCOUNTER — Other Ambulatory Visit (INDEPENDENT_AMBULATORY_CARE_PROVIDER_SITE_OTHER): Payer: BC Managed Care – PPO

## 2020-04-20 ENCOUNTER — Telehealth: Payer: Self-pay | Admitting: Family

## 2020-04-20 DIAGNOSIS — R739 Hyperglycemia, unspecified: Secondary | ICD-10-CM | POA: Diagnosis not present

## 2020-04-20 DIAGNOSIS — E785 Hyperlipidemia, unspecified: Secondary | ICD-10-CM | POA: Diagnosis not present

## 2020-04-20 DIAGNOSIS — I10 Essential (primary) hypertension: Secondary | ICD-10-CM

## 2020-04-20 LAB — LIPID PANEL
Cholesterol: 169 mg/dL (ref 0–200)
HDL: 39.8 mg/dL (ref >39.00)
LDL Cholesterol: 103 mg/dL — ABNORMAL HIGH (ref 0–99)
NonHDL: 129.28
Total CHOL/HDL Ratio: 4
Triglycerides: 129 mg/dL (ref 0.0–149.0)
VLDL: 25.8 mg/dL (ref 0.0–40.0)

## 2020-04-20 LAB — CBC WITH DIFFERENTIAL/PLATELET
Basophils Absolute: 0.1 10*3/uL (ref 0.0–0.1)
Basophils Relative: 2.5 % (ref 0.0–3.0)
Eosinophils Absolute: 0.1 10*3/uL (ref 0.0–0.7)
Eosinophils Relative: 1.6 % (ref 0.0–5.0)
HCT: 40.4 % (ref 39.0–52.0)
Hemoglobin: 14 g/dL (ref 13.0–17.0)
Lymphocytes Relative: 41.9 % (ref 12.0–46.0)
Lymphs Abs: 2.2 10*3/uL (ref 0.7–4.0)
MCHC: 34.7 g/dL (ref 30.0–36.0)
MCV: 95.2 fl (ref 78.0–100.0)
Monocytes Absolute: 0.4 10*3/uL (ref 0.1–1.0)
Monocytes Relative: 8 % (ref 3.0–12.0)
Neutro Abs: 2.5 10*3/uL (ref 1.4–7.7)
Neutrophils Relative %: 46 % (ref 43.0–77.0)
Platelets: 261 10*3/uL (ref 150.0–400.0)
RBC: 4.25 Mil/uL (ref 4.22–5.81)
RDW: 12.9 % (ref 11.5–15.5)
WBC: 5.4 10*3/uL (ref 4.0–10.5)

## 2020-04-20 LAB — COMPREHENSIVE METABOLIC PANEL
ALT: 48 U/L (ref 0–53)
AST: 24 U/L (ref 0–37)
Albumin: 4.2 g/dL (ref 3.5–5.2)
Alkaline Phosphatase: 62 U/L (ref 39–117)
BUN: 22 mg/dL (ref 6–23)
CO2: 27 mEq/L (ref 19–32)
Calcium: 9.5 mg/dL (ref 8.4–10.5)
Chloride: 101 mEq/L (ref 96–112)
Creatinine, Ser: 1.31 mg/dL (ref 0.40–1.50)
GFR: 67.43 mL/min (ref >60.00)
Glucose, Bld: 243 mg/dL — ABNORMAL HIGH (ref 70–99)
Potassium: 3.9 mEq/L (ref 3.5–5.1)
Sodium: 135 mEq/L (ref 135–145)
Total Bilirubin: 0.8 mg/dL (ref 0.2–1.2)
Total Protein: 7.2 g/dL (ref 6.0–8.3)

## 2020-04-20 LAB — HEMOGLOBIN A1C: Hgb A1c MFr Bld: 7.3 % — ABNORMAL HIGH (ref 4.6–6.5)

## 2020-04-20 MED ORDER — METFORMIN HCL 1000 MG PO TABS
1000.0000 mg | ORAL_TABLET | Freq: Two times a day (BID) | ORAL | 1 refills | Status: DC
Start: 2020-04-20 — End: 2020-08-29

## 2020-04-20 NOTE — Telephone Encounter (Signed)
Covering for Craig House- Please advise pt that sugar control has worsened. I would like him to increase metformin to 1000mg  twice daily. (new rx sent to his pharmacy). Follow up with Craig House in 3 months.

## 2020-04-21 NOTE — Telephone Encounter (Signed)
Patient advised of results and medication change, he agrees with plan and will call back for 3 months follow up appointment with Craig House.

## 2020-04-21 NOTE — Telephone Encounter (Signed)
Called but no answer, lvm for patient to call back about resutls

## 2020-06-30 ENCOUNTER — Other Ambulatory Visit: Payer: Self-pay | Admitting: Medical

## 2020-08-28 ENCOUNTER — Ambulatory Visit (INDEPENDENT_AMBULATORY_CARE_PROVIDER_SITE_OTHER): Payer: Managed Care, Other (non HMO) | Admitting: Medical

## 2020-08-28 ENCOUNTER — Encounter: Payer: Self-pay | Admitting: Medical

## 2020-08-28 ENCOUNTER — Other Ambulatory Visit: Payer: Self-pay

## 2020-08-28 VITALS — BP 139/70 | HR 87 | Temp 98.1°F | Resp 20 | Ht 67.0 in | Wt 195.2 lb

## 2020-08-28 DIAGNOSIS — T7840XA Allergy, unspecified, initial encounter: Secondary | ICD-10-CM

## 2020-08-28 DIAGNOSIS — I10 Essential (primary) hypertension: Secondary | ICD-10-CM

## 2020-08-28 DIAGNOSIS — Z125 Encounter for screening for malignant neoplasm of prostate: Secondary | ICD-10-CM

## 2020-08-28 DIAGNOSIS — E119 Type 2 diabetes mellitus without complications: Secondary | ICD-10-CM

## 2020-08-28 LAB — GLUCOSE, POCT (MANUAL RESULT ENTRY): POC Glucose: 137 mg/dl — AB (ref 70–99)

## 2020-08-28 MED ORDER — HYDROXYZINE HCL 10 MG PO TABS
ORAL_TABLET | ORAL | 0 refills | Status: DC
Start: 2020-08-28 — End: 2020-08-28

## 2020-08-28 MED ORDER — HYDROXYZINE HCL 10 MG PO TABS: ORAL_TABLET | ORAL | 0 refills | Status: DC

## 2020-08-28 MED ORDER — METHYLPREDNISOLONE 4 MG PO TABS
ORAL_TABLET | ORAL | 0 refills | Status: DC
Start: 2020-08-28 — End: 2021-06-18

## 2020-08-28 MED ORDER — SILDENAFIL CITRATE 20 MG PO TABS: ORAL_TABLET | ORAL | 0 refills | Status: DC

## 2020-08-28 NOTE — Addendum Note (Signed)
Addended by: Anabel Halon on: 08/28/2020 03:26 PM   Modules accepted: Orders

## 2020-08-28 NOTE — Patient Instructions (Addendum)
You appear to have allergic reaction but cause not identified. Will rx low dose medrol for allergic reaction and hydroxyzine for itching. Rx advisment.   For diabetes get cmp and a1c. Low sugar diet and check sugars daily. If sugar over 200 notify me. Continue metformin. May need dose adjustment.  For htn continue current bp med. Controlled level today.  Get psa for screening prostate cancer.  Follow up date to be determined after lab review.

## 2020-08-28 NOTE — Progress Notes (Signed)
Subjective:    Patient ID: Craig House, male    DOB: 09/02/1961, 59 y.o.   MRN: [id number]  HPI  Rash since past Thursday. Started base of neck. Then got on left side of face and then on rt arm. Areas have itched. No shortness of breath, no lip swelling and not tongue swelling.  Outside doing work with son. No insect bite. Was pulling some weeds about a week before. No definite exposure to poison ivy.  On review no other suspicious  exposure. Occasional rash in past and could not identify cause.  Pt has not checked his sugar recently. Pt is diabetic. Pt only taking metformin 500 mg once a day. Last rx was 1000 mg bid but he did not fill the rx. Pt does not have glucometer.   Sugar is 137 today during interview.     Review of Systems  Constitutional: Negative for chills, diaphoresis and fatigue.  Respiratory: Negative for cough, chest tightness, shortness of breath and wheezing.   Cardiovascular: Negative for chest pain and palpitations.  Gastrointestinal: Negative for abdominal pain.  Endocrine: Negative for polydipsia and polyphagia.  Genitourinary: Negative for dysuria.  Musculoskeletal: Negative for back pain and gait problem.  Skin: Positive for rash.  Neurological: Negative for dizziness and headaches.  Hematological: Negative for adenopathy. Does not bruise/bleed easily.  Psychiatric/Behavioral: Negative for behavioral problems and confusion.   Past Medical History:  Diagnosis Date  . Allergy    spring  . Anemia   . Asthma   . Hyperplastic colon polyp   . Hypertension   . Obesity      Social History   Socioeconomic History  . Marital status: Divorced    Spouse name: Not on file  . Number of children: Not on file  . Years of education: Not on file  . Highest education level: Not on file  Occupational History  . Not on file  Tobacco Use  . Smoking status: Former Research scientist (life sciences)  . Smokeless tobacco: Never Used  Vaping Use  . Vaping Use: Never used    Substance and Sexual Activity  . Alcohol use: Yes    Alcohol/week: 1.0 standard drink    Types: 1 Standard drinks or equivalent per week    Comment: 1 glass of red wine at night  . Drug use: No  . Sexual activity: Yes  Other Topics Concern  . Not on file  Social History Narrative  . Not on file   Social Determinants of Health   Financial Resource Strain:   . Difficulty of Paying Living Expenses: Not on file  Food Insecurity:   . Worried About Charity fundraiser in the Last Year: Not on file  . Ran Out of Food in the Last Year: Not on file  Transportation Needs:   . Lack of Transportation (Medical): Not on file  . Lack of Transportation (Non-Medical): Not on file  Physical Activity:   . Days of Exercise per Week: Not on file  . Minutes of Exercise per Session: Not on file  Stress:   . Feeling of Stress : Not on file  Social Connections:   . Frequency of Communication with Friends and Family: Not on file  . Frequency of Social Gatherings with Friends and Family: Not on file  . Attends Religious Services: Not on file  . Active Member of Clubs or Organizations: Not on file  . Attends Archivist Meetings: Not on file  . Marital Status: Not on file  Intimate Partner Violence:   . Fear of Current or Ex-Partner: Not on file  . Emotionally Abused: Not on file  . Physically Abused: Not on file  . Sexually Abused: Not on file    Past Surgical History:  Procedure Laterality Date  . KNEE ARTHROSCOPY Bilateral     Family History  Problem Relation Age of Onset  . Heart attack Mother   . Cancer Father   . Heart disease Father   . Colon cancer Neg Hx     No Known Allergies  Current Outpatient Medications on File Prior to Visit  Medication Sig Dispense Refill  . albuterol (PROVENTIL HFA;VENTOLIN HFA) 108 (90 Base) MCG/ACT inhaler Inhale 2 puffs into the lungs every 6 (six) hours as needed for wheezing or shortness of breath. Can give proair 18 g 2  . amLODipine  (NORVASC) 10 MG tablet TAKE 1 TABLET(10 MG) BY MOUTH DAILY 90 tablet 3  . aspirin EC 81 MG tablet Take by mouth.    Marland Kitchen atorvastatin (LIPITOR) 20 MG tablet 1 tab po q day 90 tablet 3  . Iron-Vitamin C 65-125 MG TABS Take by mouth.    . metFORMIN (GLUCOPHAGE) 1000 MG tablet Take 1 tablet (1,000 mg total) by mouth 2 (two) times daily with a meal. 180 tablet 1  . Multiple Vitamin (MULTI-VITAMINS) TABS Take by mouth.    . naproxen sodium (ANAPROX) 220 MG tablet Take 440 mg by mouth 2 (two) times daily as needed.    . neomycin-polymyxin-hydrocortisone (CORTISPORIN) OTIC solution Place 3 drops into the left ear 4 (four) times daily. 10 mL 0  . sildenafil (REVATIO) 20 MG tablet 2-5 tab po 1 hour prior to sex. 50 tablet 0  . triamterene-hydrochlorothiazide (DYAZIDE) 37.5-25 MG capsule Take 1 each (1 capsule total) by mouth every morning. 90 capsule 3  . UNABLE TO FIND Med Name: Tumeric root oral    . sildenafil (VIAGRA) 50 MG tablet 1 tab po 1 hour prior to sex (Patient not taking: Reported on 04/03/2020) 10 tablet 0   No current facility-administered medications on file prior to visit.    BP (!) 154/83   Pulse 87   Temp 98.1 F (36.7 C) (Oral)   Resp 20   Ht 5\' 7"  (1.702 m)   Wt 195 lb 3.2 oz (88.5 kg)   SpO2 95%   BMI 30.57 kg/m       Objective:   Physical Exam  General Mental Status- Alert. General Appearance- Not in acute distress.   Skin Mild raised rashe anterior base of neck and left clavicle. Faint residual rash left check and forehead. Faint papular rash to rt forearm and distal humerus.  Neck Carotid Arteries- Normal color. Moisture- Normal Moisture. No carotid bruits. No JVD.  Chest and Lung Exam Auscultation: Breath Sounds:-Normal.  Cardiovascular Auscultation:Rythm- Regular. Murmurs & Other Heart Sounds:Auscultation of the heart reveals- No Murmurs.  Abdomen Inspection:-Inspeection Normal. Palpation/Percussion:Note:No mass. Palpation and Percussion of the abdomen  reveal- Non Tender, Non Distended + BS, no rebound or guarding.    Neurologic Cranial Nerve exam:- CN III-XII intact(No nystagmus), symmetric smile. Strength:- 5/5 equal and symmetric strength both upper and lower extremities.      Assessment & Plan:  You appear to have allergic reaction but cause not identified. Will rx low dose medrol for allergic reaction and hydroxyzine for itching. Rx advisment.   For diabetes get cmp and a1c. Low sugar diet and check sugars daily. If sugar over 200 notify me. Continue metformin. May  need dose adjustment.  For htn continue current bp med. Controlled level today.  Get psa for screening prostate cancer.  Follow up date to be determined after lab review.  Mackie Pai, PA-C

## 2020-08-28 NOTE — Addendum Note (Signed)
Addended by: Kelle Darting A on: 08/28/2020 03:30 PM   Modules accepted: Orders

## 2020-08-29 ENCOUNTER — Encounter: Payer: Self-pay | Admitting: Medical

## 2020-08-29 ENCOUNTER — Telehealth: Payer: Self-pay | Admitting: Medical

## 2020-08-29 LAB — PSA: PSA: 1.18 ng/mL (ref <=4.0)

## 2020-08-29 LAB — COMPREHENSIVE METABOLIC PANEL
AG Ratio: 1.2 (calc) (ref 1.0–2.5)
ALT: 35 U/L (ref 9–46)
AST: 23 U/L (ref 10–35)
Albumin: 4.5 g/dL (ref 3.6–5.1)
Alkaline phosphatase (APISO): 59 U/L (ref 35–144)
BUN/Creatinine Ratio: 22 (calc) (ref 6–22)
BUN: 32 mg/dL — ABNORMAL HIGH (ref 7–25)
CO2: 17 mmol/L — ABNORMAL LOW (ref 20–32)
Calcium: 10.2 mg/dL (ref 8.6–10.3)
Chloride: 99 mmol/L (ref 98–110)
Creat: 1.48 mg/dL — ABNORMAL HIGH (ref 0.70–1.25)
Globulin: 3.7 g/dL (calc) (ref 1.9–3.7)
Glucose, Bld: 139 mg/dL — ABNORMAL HIGH (ref 65–99)
Potassium: 4 mmol/L (ref 3.5–5.3)
Sodium: 136 mmol/L (ref 135–146)
Total Bilirubin: 0.6 mg/dL (ref 0.2–1.2)
Total Protein: 8.2 g/dL — ABNORMAL HIGH (ref 6.1–8.1)

## 2020-08-29 LAB — HEMOGLOBIN A1C
Hgb A1c MFr Bld: 8.2 % of total Hgb — ABNORMAL HIGH (ref <5.7)
Mean Plasma Glucose: 189 (calc)
eAG (mmol/L): 10.4 (calc)

## 2020-08-29 MED ORDER — METFORMIN HCL 1000 MG PO TABS: 1000.0000 mg | ORAL_TABLET | Freq: Two times a day (BID) | ORAL | 1 refills | Status: DC

## 2020-08-29 NOTE — Telephone Encounter (Signed)
Rx metformin sent to pharmacy.

## 2020-08-31 ENCOUNTER — Telehealth: Payer: Self-pay | Admitting: Medical

## 2020-08-31 MED ORDER — INSULIN LISPRO (1 UNIT DIAL) 100 UNIT/ML (KWIKPEN): 5.0000 [IU] | PEN_INJECTOR | Freq: Three times a day (TID) | SUBCUTANEOUS | 0 refills | Status: DC

## 2020-08-31 NOTE — Telephone Encounter (Signed)
Opened to review 

## 2020-08-31 NOTE — Telephone Encounter (Signed)
Sliding scale humalog sent to pt pharmacy.

## 2020-09-03 NOTE — Telephone Encounter (Signed)
Can you send in an alternative

## 2020-09-04 NOTE — Telephone Encounter (Signed)
3 samples of humalog in fridge

## 2021-03-15 ENCOUNTER — Other Ambulatory Visit: Payer: Self-pay | Admitting: Medical

## 2021-03-27 ENCOUNTER — Other Ambulatory Visit: Payer: Self-pay | Admitting: Medical

## 2021-06-12 ENCOUNTER — Other Ambulatory Visit: Payer: Self-pay | Admitting: Family

## 2021-06-13 ENCOUNTER — Other Ambulatory Visit: Payer: Self-pay | Admitting: Medical

## 2021-06-13 NOTE — Telephone Encounter (Signed)
Rx metformin sent to pt pharamcy. Please get pt scheduled for follow up. It has been about 10 months since last seen.

## 2021-06-14 NOTE — Telephone Encounter (Signed)
Appt made for 06/18/2021

## 2021-06-18 ENCOUNTER — Other Ambulatory Visit: Payer: Self-pay | Admitting: Medical

## 2021-06-18 ENCOUNTER — Ambulatory Visit (INDEPENDENT_AMBULATORY_CARE_PROVIDER_SITE_OTHER): Payer: Managed Care, Other (non HMO) | Admitting: Medical

## 2021-06-18 ENCOUNTER — Other Ambulatory Visit: Payer: Self-pay

## 2021-06-18 ENCOUNTER — Encounter: Payer: Self-pay | Admitting: Medical

## 2021-06-18 VITALS — BP 128/77 | HR 85 | Temp 98.6°F | Ht 67.0 in | Wt 199.0 lb

## 2021-06-18 DIAGNOSIS — E669 Obesity, unspecified: Secondary | ICD-10-CM

## 2021-06-18 DIAGNOSIS — Z125 Encounter for screening for malignant neoplasm of prostate: Secondary | ICD-10-CM | POA: Diagnosis not present

## 2021-06-18 DIAGNOSIS — E785 Hyperlipidemia, unspecified: Secondary | ICD-10-CM | POA: Diagnosis not present

## 2021-06-18 DIAGNOSIS — E119 Type 2 diabetes mellitus without complications: Secondary | ICD-10-CM | POA: Diagnosis not present

## 2021-06-18 DIAGNOSIS — R0981 Nasal congestion: Secondary | ICD-10-CM

## 2021-06-18 DIAGNOSIS — I10 Essential (primary) hypertension: Secondary | ICD-10-CM | POA: Diagnosis not present

## 2021-06-18 DIAGNOSIS — J0100 Acute maxillary sinusitis, unspecified: Secondary | ICD-10-CM

## 2021-06-18 LAB — LIPID PANEL
Cholesterol: 146 mg/dL (ref 0–200)
HDL: 37.3 mg/dL — ABNORMAL LOW (ref >39.00)
LDL Cholesterol: 78 mg/dL (ref 0–99)
NonHDL: 108.99
Total CHOL/HDL Ratio: 4
Triglycerides: 155 mg/dL — ABNORMAL HIGH (ref 0.0–149.0)
VLDL: 31 mg/dL (ref 0.0–40.0)

## 2021-06-18 LAB — COMPREHENSIVE METABOLIC PANEL
ALT: 41 U/L (ref 0–53)
AST: 28 U/L (ref 0–37)
Albumin: 4.5 g/dL (ref 3.5–5.2)
Alkaline Phosphatase: 47 U/L (ref 39–117)
BUN: 21 mg/dL (ref 6–23)
CO2: 29 mEq/L (ref 19–32)
Calcium: 10.1 mg/dL (ref 8.4–10.5)
Chloride: 98 mEq/L (ref 96–112)
Creatinine, Ser: 1.21 mg/dL (ref 0.40–1.50)
GFR: 64.58 mL/min (ref >60.00)
Glucose, Bld: 127 mg/dL — ABNORMAL HIGH (ref 70–99)
Potassium: 4.3 mEq/L (ref 3.5–5.1)
Sodium: 134 mEq/L — ABNORMAL LOW (ref 135–145)
Total Bilirubin: 0.8 mg/dL (ref 0.2–1.2)
Total Protein: 7.9 g/dL (ref 6.0–8.3)

## 2021-06-18 LAB — HEMOGLOBIN A1C: Hgb A1c MFr Bld: 6.7 % — ABNORMAL HIGH (ref 4.6–6.5)

## 2021-06-18 LAB — PSA: PSA: 1.47 ng/mL (ref 0.10–4.00)

## 2021-06-18 MED ORDER — FLUTICASONE PROPIONATE 50 MCG/ACT NA SUSP: 2.0000 | Freq: Every day | NASAL | 1 refills | Status: DC

## 2021-06-18 MED ORDER — TRIAMTERENE-HCTZ 37.5-25 MG PO CAPS: 1.0000 | ORAL_CAPSULE | Freq: Every morning | ORAL | 3 refills | Status: DC

## 2021-06-18 MED ORDER — ALBUTEROL SULFATE HFA 108 (90 BASE) MCG/ACT IN AERS: 2.0000 | INHALATION_SPRAY | Freq: Four times a day (QID) | RESPIRATORY_TRACT | 4 refills | Status: DC | PRN

## 2021-06-18 MED ORDER — AMLODIPINE BESYLATE 10 MG PO TABS: ORAL_TABLET | ORAL | 3 refills | Status: DC

## 2021-06-18 MED ORDER — CEFDINIR 300 MG PO CAPS: 300.0000 mg | ORAL_CAPSULE | Freq: Two times a day (BID) | ORAL | 0 refills | Status: DC

## 2021-06-18 NOTE — Progress Notes (Signed)
Subjective:    Patient ID: Craig House, male    DOB: 01/01/1961, 60 y.o.   MRN: [id number]  HPI  Pt in for follow up.  Pt has diabetes. I have not seen since October 2021. Last a1c 8.2. Pt needed metformin refill the other day and I asked him to come in for follow up. Pt is swimming 3 days a week. Pt states pretty good diet. Pt states in lat afternoon sugar 98- low 100' s.  Pt states morning sugar 160-200. Metformin dose is 1000 mg twice daily.    Hx of htn. Pt is on amlodipine 10 mg daily. Also he is on dyzazide as well.   Hx of high cholesterol. On Lipitor 20 mg daily.   Mild nasal congestion for 10 days. 2 covid test negative. Sinus pain presently. Rt upper sensation of tooth pain.     Review of Systems  Constitutional:  Negative for chills, fatigue and fever.  HENT:  Negative for congestion and ear pain.   Respiratory:  Negative for cough, chest tightness, shortness of breath and wheezing.   Cardiovascular:  Negative for chest pain and palpitations.  Gastrointestinal:  Negative for abdominal pain and blood in stool.  Endocrine: Negative for polydipsia, polyphagia and polyuria.  Genitourinary:  Negative for dysuria, frequency and hematuria.  Musculoskeletal:  Negative for back pain and neck pain.  Skin:  Negative for rash.  Neurological:  Negative for dizziness, seizures and headaches.  Hematological:  Negative for adenopathy. Does not bruise/bleed easily.  Psychiatric/Behavioral:  Negative for behavioral problems, decreased concentration, dysphoric mood and sleep disturbance. The patient is not nervous/anxious.     Past Medical History:  Diagnosis Date   Allergy    spring   Anemia    Asthma    Hyperplastic colon polyp    Hypertension    Obesity      Social History   Socioeconomic History   Marital status: Divorced    Spouse name: Not on file   Number of children: Not on file   Years of education: Not on file   Highest education level: Not on file   Occupational History   Not on file  Tobacco Use   Smoking status: Former   Smokeless tobacco: Never  Vaping Use   Vaping Use: Never used  Substance and Sexual Activity   Alcohol use: Yes    Alcohol/week: 1.0 standard drink    Types: 1 Standard drinks or equivalent per week    Comment: 1 glass of red wine at night   Drug use: No   Sexual activity: Yes  Other Topics Concern   Not on file  Social History Narrative   Not on file   Social Determinants of Health   Financial Resource Strain: Not on file  Food Insecurity: Not on file  Transportation Needs: Not on file  Physical Activity: Not on file  Stress: Not on file  Social Connections: Not on file  Intimate Partner Violence: Not on file    Past Surgical History:  Procedure Laterality Date   KNEE ARTHROSCOPY Bilateral     Family History  Problem Relation Age of Onset   Heart attack Mother    Cancer Father    Heart disease Father    Colon cancer Neg Hx     No Known Allergies  Current Outpatient Medications on File Prior to Visit  Medication Sig Dispense Refill   albuterol (PROVENTIL HFA;VENTOLIN HFA) 108 (90 Base) MCG/ACT inhaler Inhale 2 puffs into the  lungs every 6 (six) hours as needed for wheezing or shortness of breath. Can give proair 18 g 2   amLODipine (NORVASC) 10 MG tablet TAKE 1 TABLET(10 MG) BY MOUTH DAILY 90 tablet 3   aspirin EC 81 MG tablet Take by mouth.     atorvastatin (LIPITOR) 20 MG tablet 1 tab po q day 90 tablet 3   hydrOXYzine (ATARAX/VISTARIL) 10 MG tablet 1 tab po q 8 hrs prn itching 30 tablet 0   insulin lispro (HUMALOG) 100 UNIT/ML KwikPen Inject 5 Units into the skin 3 (three) times daily. 3 mL 0   Iron-Vitamin C 65-125 MG TABS Take by mouth.     metFORMIN (GLUCOPHAGE) 1000 MG tablet TAKE 1 TABLET(1000 MG) BY MOUTH TWICE DAILY WITH A MEAL 60 tablet 0   Multiple Vitamin (MULTI-VITAMINS) TABS Take by mouth.     naproxen sodium (ANAPROX) 220 MG tablet Take 440 mg by mouth 2 (two) times  daily as needed.     triamterene-hydrochlorothiazide (DYAZIDE) 37.5-25 MG capsule TAKE ONE CAPSULE BY MOUTH EVERY MORNING 90 capsule 3   UNABLE TO FIND Med Name: Tumeric root oral     sildenafil (REVATIO) 20 MG tablet 2-5 tab po 1 hour prior to sex. 50 tablet 0   sildenafil (VIAGRA) 50 MG tablet 1 tab po 1 hour prior to sex 10 tablet 0   No current facility-administered medications on file prior to visit.    BP 128/77   Pulse 85   Temp 98.6 F (37 C)   Ht '5\' 7"'$  (1.702 m)   Wt 199 lb (90.3 kg)   SpO2 96%   BMI 31.17 kg/m       Objective:   Physical Exam   General Mental Status- Alert. General Appearance- Not in acute distress.   Skin General: Color- Normal Color. Moisture- Normal Moisture.  Neck Carotid Arteries- Normal color. Moisture- Normal Moisture. No carotid bruits. No JVD.  Chest and Lung Exam Auscultation: Breath Sounds:-Normal.  Cardiovascular Auscultation:Rythm- Regular. Murmurs & Other Heart Sounds:Auscultation of the heart reveals- No Murmurs.  Abdomen Inspection:-Inspeection Normal. Palpation/Percussion:Note:No mass. Palpation and Percussion of the abdomen reveal- Non Tender, Non Distended + BS, no rebound or guarding.   Neurologic Cranial Nerve exam:- CN III-XII intact(No nystagmus), symmetric smile. Strength:- 5/5 equal and symmetric strength both upper and lower extremities.   Heent- maxillary sinus pressure to palpation. Mld boggy turbinates.     Assessment & Plan:   For diabetes will get a1c and cmp today. Adjust meds if necessary based on the results.  For htn continue current meds. Bp well controlled.   For high cholesterol continue atorvastatin. Get lipid panel today.  For probable sinus infection. Rx flonase and cefdnir.  Screening psa today.  Follow up in 3 months or sooner if needed   [employer], PA-C

## 2021-06-18 NOTE — Patient Instructions (Addendum)
For diabetes will get a1c and cmp today. Adjust meds if necessary based on the results.  For htn continue current meds. Bp well controlled.   For high cholesterol continue atorvastatin. Get lipid panel today.  For probable sinus infection. Rx flonase and cefdnir antibiotic.  Screening psa today.  Mild obese. Bmi just above 30. Refer to healthy wt loss management.  Follow up in 3 months or sooner if needed.

## 2021-06-19 LAB — MICROALBUMIN, URINE: Microalb, Ur: <0.2 mg/dL

## 2021-07-14 ENCOUNTER — Other Ambulatory Visit: Payer: Self-pay | Admitting: Medical

## 2021-09-24 ENCOUNTER — Ambulatory Visit: Payer: Managed Care, Other (non HMO) | Attending: Internal Medicine

## 2021-09-24 ENCOUNTER — Ambulatory Visit (INDEPENDENT_AMBULATORY_CARE_PROVIDER_SITE_OTHER): Payer: Managed Care, Other (non HMO) | Admitting: Medical

## 2021-09-24 ENCOUNTER — Other Ambulatory Visit: Payer: Self-pay

## 2021-09-24 VITALS — BP 129/84 | HR 88 | Ht 67.0 in | Wt 196.0 lb

## 2021-09-24 DIAGNOSIS — I10 Essential (primary) hypertension: Secondary | ICD-10-CM

## 2021-09-24 DIAGNOSIS — Z0000 Encounter for general adult medical examination without abnormal findings: Secondary | ICD-10-CM

## 2021-09-24 DIAGNOSIS — Z23 Encounter for immunization: Secondary | ICD-10-CM | POA: Diagnosis not present

## 2021-09-24 DIAGNOSIS — E785 Hyperlipidemia, unspecified: Secondary | ICD-10-CM

## 2021-09-24 DIAGNOSIS — E119 Type 2 diabetes mellitus without complications: Secondary | ICD-10-CM

## 2021-09-24 LAB — LIPID PANEL
Cholesterol: 129 mg/dL (ref 0–200)
HDL: 39.5 mg/dL (ref >39.00)
LDL Cholesterol: 68 mg/dL (ref 0–99)
NonHDL: 89.72
Total CHOL/HDL Ratio: 3
Triglycerides: 110 mg/dL (ref 0.0–149.0)
VLDL: 22 mg/dL (ref 0.0–40.0)

## 2021-09-24 LAB — COMPREHENSIVE METABOLIC PANEL
ALT: 39 U/L (ref 0–53)
AST: 22 U/L (ref 0–37)
Albumin: 4.4 g/dL (ref 3.5–5.2)
Alkaline Phosphatase: 47 U/L (ref 39–117)
BUN: 22 mg/dL (ref 6–23)
CO2: 28 mEq/L (ref 19–32)
Calcium: 9.7 mg/dL (ref 8.4–10.5)
Chloride: 101 mEq/L (ref 96–112)
Creatinine, Ser: 1.36 mg/dL (ref 0.40–1.50)
GFR: 56.02 mL/min — ABNORMAL LOW (ref >60.00)
Glucose, Bld: 115 mg/dL — ABNORMAL HIGH (ref 70–99)
Potassium: 4.3 mEq/L (ref 3.5–5.1)
Sodium: 137 mEq/L (ref 135–145)
Total Bilirubin: 0.7 mg/dL (ref 0.2–1.2)
Total Protein: 7.5 g/dL (ref 6.0–8.3)

## 2021-09-24 LAB — HEMOGLOBIN A1C: Hgb A1c MFr Bld: 6.8 % — ABNORMAL HIGH (ref 4.6–6.5)

## 2021-09-24 MED ORDER — SILDENAFIL CITRATE 100 MG PO TABS: 50.0000 mg | ORAL_TABLET | Freq: Every day | ORAL | 0 refills | Status: DC | PRN

## 2021-09-24 MED ORDER — SILDENAFIL CITRATE 50 MG PO TABS: ORAL_TABLET | ORAL | 0 refills | Status: DC

## 2021-09-24 NOTE — Progress Notes (Addendum)
Subjective:    Patient ID: Craig House, male    DOB: 1961-03-12, 60 y.o.   MRN: [id number]  HPI Discussed with pt will do wellness exam as well. Since new company may require.  Pt has new job. Exericisng, eating healthy. Drinks 3 cups of coffee a day. Non smoker.  Pt states he called his GI MD and they told him due in 2025 for repeat colonoscopy.   Offered pcv 20 vaccine.  Diabetic pt with last a1c of 6.7. Prescribed metformin 1000 mg twice daily in the past. Pt states tying to eat health/low sugar. Swimming 3 times a week. At home sugar checks 90-100 at late after noon. Sometimes in mornnig high 100's fasting.    Htn- on norvasc 10 mg daily and dyazide 37.25/25 daily.   Hyperlipidemia- on lipitor 20 mg daily. Bp controlled today. Better at home in 119/70 range at times.   ED- in past use viagra.  Offer flu vaccine today and offer pcv 20.     Review of Systems  Constitutional:  Negative for chills, fatigue and fever.  Respiratory:  Negative for cough, chest tightness, shortness of breath and wheezing.   Cardiovascular:  Negative for chest pain and palpitations.  Gastrointestinal:  Negative for abdominal pain.  Genitourinary:  Negative for dysuria, flank pain and frequency.       Hx of ED. Needs refill.  Musculoskeletal:  Negative for back pain, joint swelling, myalgias and neck stiffness.  Skin:  Negative for rash.  Neurological:  Negative for dizziness and headaches.  Psychiatric/Behavioral:  Negative for behavioral problems and confusion.    Past Medical History:  Diagnosis Date   Allergy    spring   Anemia    Asthma    Hyperplastic colon polyp    Hypertension    Obesity      Social History   Socioeconomic History   Marital status: Divorced    Spouse name: Not on file   Number of children: Not on file   Years of education: Not on file   Highest education level: Not on file  Occupational History   Not on file  Tobacco Use   Smoking status:  Former   Smokeless tobacco: Never  Vaping Use   Vaping Use: Never used  Substance and Sexual Activity   Alcohol use: Yes    Alcohol/week: 1.0 standard drink    Types: 1 Standard drinks or equivalent per week    Comment: 1 glass of red wine at night   Drug use: No   Sexual activity: Yes  Other Topics Concern   Not on file  Social History Narrative   Not on file   Social Determinants of Health   Financial Resource Strain: Not on file  Food Insecurity: Not on file  Transportation Needs: Not on file  Physical Activity: Not on file  Stress: Not on file  Social Connections: Not on file  Intimate Partner Violence: Not on file    Past Surgical History:  Procedure Laterality Date   KNEE ARTHROSCOPY Bilateral     Family History  Problem Relation Age of Onset   Heart attack Mother    Cancer Father    Heart disease Father    Colon cancer Neg Hx     No Known Allergies  Current Outpatient Medications on File Prior to Visit  Medication Sig Dispense Refill   albuterol (VENTOLIN HFA) 108 (90 Base) MCG/ACT inhaler Inhale 2 puffs into the lungs every 6 (six) hours as needed  for wheezing or shortness of breath. Can give proair 18 g 4   amLODipine (NORVASC) 10 MG tablet 1 tab po q day 90 tablet 3   aspirin EC 81 MG tablet Take by mouth.     atorvastatin (LIPITOR) 20 MG tablet TAKE 1 TABLET BY MOUTH EVERY DAY 90 tablet 3   cefdinir (OMNICEF) 300 MG capsule Take 1 capsule (300 mg total) by mouth 2 (two) times daily. 20 capsule 0   fluticasone (FLONASE) 50 MCG/ACT nasal spray Place 2 sprays into both nostrils daily. 16 g 1   hydrOXYzine (ATARAX/VISTARIL) 10 MG tablet 1 tab po q 8 hrs prn itching 30 tablet 0   insulin lispro (HUMALOG) 100 UNIT/ML KwikPen Inject 5 Units into the skin 3 (three) times daily. 3 mL 0   Iron-Vitamin C 65-125 MG TABS Take by mouth.     metFORMIN (GLUCOPHAGE) 1000 MG tablet TAKE 1 TABLET(1000 MG) BY MOUTH TWICE DAILY WITH A MEAL 60 tablet 0   Multiple Vitamin  (MULTI-VITAMINS) TABS Take by mouth.     naproxen sodium (ANAPROX) 220 MG tablet Take 440 mg by mouth 2 (two) times daily as needed.     sildenafil (REVATIO) 20 MG tablet 2-5 tab po 1 hour prior to sex. 50 tablet 0   sildenafil (VIAGRA) 50 MG tablet 1 tab po 1 hour prior to sex 10 tablet 0   triamterene-hydrochlorothiazide (DYAZIDE) 37.5-25 MG capsule Take 1 each (1 capsule total) by mouth every morning. 90 capsule 3   UNABLE TO FIND Med Name: Tumeric root oral     No current facility-administered medications on file prior to visit.    BP 129/84   Pulse 88   Ht 5\' 7"  (1.702 m)   Wt 196 lb (88.9 kg)   SpO2 98%   BMI 30.70 kg/m        Objective:   Physical Exam  General Mental Status- Alert. General Appearance- Not in acute distress.   Skin General: Color- Normal Color. Moisture- Normal Moisture.  Neck No carotid bruits. No JVD.  Chest and Lung Exam Auscultation: Breath Sounds:-Normal.  Cardiovascular Auscultation:Rythm- Regular. Murmurs & Other Heart Sounds:Auscultation of the heart reveals- No Murmurs.  Abdomen Inspection:-Inspeection Normal. Palpation/Percussion:Note:No mass. Palpation and Percussion of the abdomen reveal- Non Tender, Non Distended + BS, no rebound or guarding.   Neurologic Cranial Nerve exam:- CN III-XII intact(No nystagmus), symmetric smile. Strength:- 5/5 equal and symmetric strength both upper and lower extremities.       Assessment & Plan:   Patient Instructions  For you wellness exam today I have ordered cbc, cmp and lipid panel.  Pcv 20 vaccine today.  Recommend exercise and healthy diet.  We will let you know lab results as they come in.  Follow up date appointment will be determined after lab review.    Up to date on colonsocopy. Psa done on last visit.  For diabetes continue metformin and check a1c.   High cholesterol continue lipitor.  For ED refilled your viagra.       Mackie Pai, PA-C  Note pcv 20 will do  when have in stock.   [id number] charge as did additional 10 minutes addressing htn, diabetes, high choleserol and ED.

## 2021-09-24 NOTE — Progress Notes (Signed)
   Covid-[facility] Vaccination [facility]  Name:  Craig House    MRN: [id number] DOB: April 29, 1961  09/24/2021  [name] was observed post Covid-19 immunization for 15 minutes without incident. He was provided with Vaccine Information Sheet and instruction to access the V-Safe system.   [name] was instructed to call 911 with any severe reactions post vaccine: Difficulty breathing  Swelling of face and throat  A fast heartbeat  A bad rash all over body  Dizziness and weakness   Immunizations Administered     Name Date Dose VIS Date Route   Pfizer Covid-19 Vaccine Bivalent Booster 09/24/2021 11:20 AM 0.3 mL 07/14/2021 Intramuscular   Manufacturer: [place]   Lot: [id number]   [place]: [phone]

## 2021-09-24 NOTE — Addendum Note (Signed)
Addended by: Anabel Halon on: 09/24/2021 04:57 PM   Modules accepted: Orders

## 2021-09-24 NOTE — Patient Instructions (Addendum)
For you wellness exam today I have ordered cbc, cmp and lipid panel.  Pcv 20 vaccine nurse visit when we have in stock.  Recommend exercise and healthy diet.  We will let you know lab results as they come in.  Follow up date appointment will be determined after lab review.    Up to date on colonsocopy. Psa done on last visit.  For diabetes continue metformin and check a1c.   High cholesterol continue lipitor.  For ED refilled your viagra.  Preventive Care 11-44 Years Old, Male Preventive care refers to lifestyle choices and visits with your health care provider that can promote health and wellness. Preventive care visits are also called wellness exams. What can I expect for my preventive care visit? Counseling During your preventive care visit, your health care provider may ask about your: Medical history, including: Past medical problems. Family medical history. Current health, including: Emotional well-being. Home life and relationship well-being. Sexual activity. Lifestyle, including: Alcohol, nicotine or tobacco, and drug use. Access to firearms. Diet, exercise, and sleep habits. Safety issues such as seatbelt and bike helmet use. Sunscreen use. Work and work Statistician. Physical exam Your health care provider will check your: Height and weight. These may be used to calculate your BMI (body mass index). BMI is a measurement that tells if you are at a healthy weight. Waist circumference. This measures the distance around your waistline. This measurement also tells if you are at a healthy weight and may help predict your risk of certain diseases, such as type 2 diabetes and high blood pressure. Heart rate and blood pressure. Body temperature. Skin for abnormal spots. What immunizations do I need? Vaccines are usually given at various ages, according to a schedule. Your health care provider will recommend vaccines for you based on your age, medical history, and  lifestyle or other factors, such as travel or where you work. What tests do I need? Screening Your health care provider may recommend screening tests for certain conditions. This may include: Lipid and cholesterol levels. Diabetes screening. This is done by checking your blood sugar (glucose) after you have not eaten for a while (fasting). Hepatitis B test. Hepatitis C test. HIV (human immunodeficiency virus) test. STI (sexually transmitted infection) testing, if you are at risk. Lung cancer screening. Prostate cancer screening. Colorectal cancer screening. Talk with your health care provider about your test results, treatment options, and if necessary, the need for more tests. Follow these instructions at home: Eating and drinking  Eat a diet that includes fresh fruits and vegetables, whole grains, lean protein, and low-fat dairy products. Take vitamin and mineral supplements as recommended by your health care provider. Do not drink alcohol if your health care provider tells you not to drink. If you drink alcohol: Limit how much you have to 0-2 drinks a day. Know how much alcohol is in your drink. In the [place] one drink equals one 12 oz bottle of beer (355 mL), one 5 oz glass of wine (148 mL), or one 1 oz glass of hard liquor (44 mL). Lifestyle Brush your teeth every morning and night with fluoride toothpaste. Floss one time each day. Exercise for at least 30 minutes 5 or more days each week. Do not use any products that contain nicotine or tobacco. These products include cigarettes, chewing tobacco, and vaping devices, such as e-cigarettes. If you need help quitting, ask your health care provider. Do not use drugs. If you are sexually active, practice safe sex. Use a condom  or other form of protection to prevent STIs. Take aspirin only as told by your health care provider. Make sure that you understand how much to take and what form to take. Work with your health care provider to find  out whether it is safe and beneficial for you to take aspirin daily. Find healthy ways to manage stress, such as: Meditation, yoga, or listening to music. Journaling. Talking to a trusted person. Spending time with friends and family. Minimize exposure to UV radiation to reduce your risk of skin cancer. Safety Always wear your seat belt while driving or riding in a vehicle. Do not drive: If you have been drinking alcohol. Do not ride with someone who has been drinking. When you are tired or distracted. While texting. If you have been using any mind-altering substances or drugs. Wear a helmet and other protective equipment during sports activities. If you have firearms in your house, make sure you follow all gun safety procedures. What's next? Go to your health care provider once a year for an annual wellness visit. Ask your health care provider how often you should have your eyes and teeth checked. Stay up to date on all vaccines. This information is not intended to replace advice given to you by your health care provider. Make sure you discuss any questions you have with your health care provider. Document Revised: 04/28/2021 Document Reviewed: 04/28/2021 Elsevier Patient Education  [place].

## 2021-09-28 ENCOUNTER — Telehealth: Payer: Self-pay | Admitting: Medical

## 2021-09-28 ENCOUNTER — Encounter: Payer: Self-pay | Admitting: Medical

## 2021-09-28 MED ORDER — OZEMPIC (0.25 OR 0.5 MG/DOSE) 2 MG/1.5ML ~~LOC~~ SOPN: 0.2500 mg | PEN_INJECTOR | SUBCUTANEOUS | 3 refills | Status: DC

## 2021-09-28 NOTE — Telephone Encounter (Signed)
Opened for med change.

## 2021-09-28 NOTE — Addendum Note (Signed)
Addended by: Anabel Halon on: 09/28/2021 05:04 PM   Modules accepted: Orders

## 2021-09-30 ENCOUNTER — Other Ambulatory Visit (HOSPITAL_BASED_OUTPATIENT_CLINIC_OR_DEPARTMENT_OTHER): Payer: Self-pay

## 2021-09-30 MED ORDER — OZEMPIC (0.25 OR 0.5 MG/DOSE) 2 MG/1.5ML ~~LOC~~ SOPN
0.2500 mg | PEN_INJECTOR | SUBCUTANEOUS | 3 refills | Status: DC
  Filled 2021-09-30: qty 1.5, 30d supply, fill #0

## 2021-10-12 ENCOUNTER — Other Ambulatory Visit (HOSPITAL_BASED_OUTPATIENT_CLINIC_OR_DEPARTMENT_OTHER): Payer: Self-pay

## 2021-10-12 MED ORDER — PFIZER COVID-19 VAC BIVALENT 30 MCG/0.3ML IM SUSP
INTRAMUSCULAR | 0 refills | Status: DC
  Filled 2021-10-12: qty 0.3, 1d supply, fill #0

## 2021-10-14 ENCOUNTER — Other Ambulatory Visit: Payer: Self-pay | Admitting: Medical

## 2021-11-06 ENCOUNTER — Other Ambulatory Visit: Payer: Self-pay | Admitting: Medical

## 2021-11-13 ENCOUNTER — Other Ambulatory Visit: Payer: Self-pay | Admitting: Medical

## 2022-02-25 ENCOUNTER — Ambulatory Visit (INDEPENDENT_AMBULATORY_CARE_PROVIDER_SITE_OTHER): Payer: Managed Care, Other (non HMO) | Admitting: Medical

## 2022-02-25 VITALS — BP 128/80 | HR 88 | Temp 98.3°F | Resp 18 | Ht 67.0 in | Wt 193.6 lb

## 2022-02-25 DIAGNOSIS — E119 Type 2 diabetes mellitus without complications: Secondary | ICD-10-CM | POA: Diagnosis not present

## 2022-02-25 DIAGNOSIS — Z0000 Encounter for general adult medical examination without abnormal findings: Secondary | ICD-10-CM | POA: Diagnosis not present

## 2022-02-25 DIAGNOSIS — Z125 Encounter for screening for malignant neoplasm of prostate: Secondary | ICD-10-CM | POA: Diagnosis not present

## 2022-02-25 LAB — COMPREHENSIVE METABOLIC PANEL
ALT: 61 U/L — ABNORMAL HIGH (ref 0–53)
AST: 31 U/L (ref 0–37)
Albumin: 4.3 g/dL (ref 3.5–5.2)
Alkaline Phosphatase: 57 U/L (ref 39–117)
BUN: 17 mg/dL (ref 6–23)
CO2: 28 mEq/L (ref 19–32)
Calcium: 9.7 mg/dL (ref 8.4–10.5)
Chloride: 100 mEq/L (ref 96–112)
Creatinine, Ser: 1.22 mg/dL (ref 0.40–1.50)
GFR: 63.64 mL/min (ref >60.00)
Glucose, Bld: 93 mg/dL (ref 70–99)
Potassium: 4.2 mEq/L (ref 3.5–5.1)
Sodium: 135 mEq/L (ref 135–145)
Total Bilirubin: 0.8 mg/dL (ref 0.2–1.2)
Total Protein: 7.6 g/dL (ref 6.0–8.3)

## 2022-02-25 LAB — LIPID PANEL
Cholesterol: 133 mg/dL (ref 0–200)
HDL: 38.3 mg/dL — ABNORMAL LOW (ref >39.00)
LDL Cholesterol: 67 mg/dL (ref 0–99)
NonHDL: 94.33
Total CHOL/HDL Ratio: 3
Triglycerides: 137 mg/dL (ref 0.0–149.0)
VLDL: 27.4 mg/dL (ref 0.0–40.0)

## 2022-02-25 LAB — CBC WITH DIFFERENTIAL/PLATELET
Basophils Absolute: 0.1 10*3/uL (ref 0.0–0.1)
Basophils Relative: 0.9 % (ref 0.0–3.0)
Eosinophils Absolute: 0.1 10*3/uL (ref 0.0–0.7)
Eosinophils Relative: 1.8 % (ref 0.0–5.0)
HCT: 42.5 % (ref 39.0–52.0)
Hemoglobin: 14.5 g/dL (ref 13.0–17.0)
Lymphocytes Relative: 42.6 % (ref 12.0–46.0)
Lymphs Abs: 2.5 10*3/uL (ref 0.7–4.0)
MCHC: 34.1 g/dL (ref 30.0–36.0)
MCV: 95.8 fl (ref 78.0–100.0)
Monocytes Absolute: 0.5 10*3/uL (ref 0.1–1.0)
Monocytes Relative: 8.6 % (ref 3.0–12.0)
Neutro Abs: 2.8 10*3/uL (ref 1.4–7.7)
Neutrophils Relative %: 46.1 % (ref 43.0–77.0)
Platelets: 363 10*3/uL (ref 150.0–400.0)
RBC: 4.43 Mil/uL (ref 4.22–5.81)
RDW: 13.4 % (ref 11.5–15.5)
WBC: 6 10*3/uL (ref 4.0–10.5)

## 2022-02-25 LAB — PSA: PSA: 2.1 ng/mL (ref 0.10–4.00)

## 2022-02-25 LAB — HEMOGLOBIN A1C: Hgb A1c MFr Bld: 7.8 % — ABNORMAL HIGH (ref 4.6–6.5)

## 2022-02-25 MED ORDER — BUPROPION HCL ER (XL) 150 MG PO TB24: 150.0000 mg | ORAL_TABLET | Freq: Every day | ORAL | 0 refills | Status: DC

## 2022-02-25 NOTE — Progress Notes (Signed)
? ?Subjective:  ? ? Patient ID: Craig House, male    DOB: 1961/05/10, 61 y.o.   MRN: [id number] ? ?HPI ?Pt in for cpe. Pt is fasting. ?  ?Pt states working on his feet all day. He states he gets about 8000 steps a day.  ? No recent alcohol use(non since December) and nonsmoker.  Eating healthy and trying to lose wt. He start ozempic at beginning fo march ? ?Pt states will get shingrix but we don't have in stock.  ? ?Up to date on colonoscopy. ? ?Pt has htn. Checking bp 3 times a week. Most of time systolic less than 643. ?  ?Pt stopped smoking in  1994.  ? ?See ros on depression. Pt phq-9 score is 7. No hx of seizure or eating disorder. ? ? ?Review of Systems  ?Constitutional:  Negative for chills, fatigue and fever.  ?HENT:  Negative for congestion, drooling and ear discharge.   ?Respiratory:  Negative for cough, chest tightness and stridor.   ?Cardiovascular:  Negative for chest pain and palpitations.  ?Gastrointestinal:  Negative for abdominal pain.  ?Genitourinary:  Negative for dysuria, frequency and hematuria.  ?Musculoskeletal:  Negative for back pain and joint swelling.  ?Skin:  Negative for rash.  ?Neurological:  Negative for dizziness, numbness and headaches.  ?Hematological:  Negative for adenopathy. Does not bruise/bleed easily.  ?Psychiatric/Behavioral:  Positive for dysphoric mood. Negative for behavioral problems and confusion. The patient is not nervous/anxious.   ?     Children are grown up and leaving the area.  Sad for months. Phq9 score 7.  ? ? ?Past Medical History:  ?Diagnosis Date  ? Allergy   ? spring  ? Anemia   ? Asthma   ? Hyperplastic colon polyp   ? Hypertension   ? Obesity   ? ?  ?Social History  ? ?Socioeconomic History  ? Marital status: Divorced  ?  Spouse name: Not on file  ? Number of children: Not on file  ? Years of education: Not on file  ? Highest education level: Not on file  ?Occupational History  ? Not on file  ?Tobacco Use  ? Smoking status: Former  ? Smokeless  tobacco: Never  ?Vaping Use  ? Vaping Use: Never used  ?Substance and Sexual Activity  ? Alcohol use: Yes  ?  Alcohol/week: 1.0 standard drink  ?  Types: 1 Standard drinks or equivalent per week  ?  Comment: 1 glass of red wine at night  ? Drug use: No  ? Sexual activity: Yes  ?Other Topics Concern  ? Not on file  ?Social History Narrative  ? Not on file  ? ?Social Determinants of Health  ? ?Financial Resource Strain: Not on file  ?Food Insecurity: Not on file  ?Transportation Needs: Not on file  ?Physical Activity: Not on file  ?Stress: Not on file  ?Social Connections: Not on file  ?Intimate Partner Violence: Not on file  ? ? ?Past Surgical History:  ?Procedure Laterality Date  ? KNEE ARTHROSCOPY Bilateral   ? ? ?Family History  ?Problem Relation Age of Onset  ? Heart attack Mother   ? Cancer Father   ? Heart disease Father   ? Colon cancer Neg Hx   ? ? ?No Known Allergies ? ?Current Outpatient Medications on File Prior to Visit  ?Medication Sig Dispense Refill  ? albuterol (VENTOLIN HFA) 108 (90 Base) MCG/ACT inhaler INHALE 2 PUFFS BY MOUTH EVERY 6 HOURS AS NEEDED FOR WHEEZING OR  SHORTNESS OF BREATH 8.5 g 0  ? amLODipine (NORVASC) 10 MG tablet 1 tab po q day 90 tablet 3  ? aspirin EC 81 MG tablet Take by mouth.    ? atorvastatin (LIPITOR) 20 MG tablet TAKE 1 TABLET BY MOUTH EVERY DAY 90 tablet 3  ? cefdinir (OMNICEF) 300 MG capsule Take 1 capsule (300 mg total) by mouth 2 (two) times daily. 20 capsule 0  ? COVID-19 mRNA bivalent vaccine, Pfizer, (PFIZER COVID-19 VAC BIVALENT) injection Inject into the muscle. 0.3 mL 0  ? fluticasone (FLONASE) 50 MCG/ACT nasal spray Place 2 sprays into both nostrils daily. 16 g 1  ? hydrOXYzine (ATARAX/VISTARIL) 10 MG tablet 1 tab po q 8 hrs prn itching 30 tablet 0  ? Iron-Vitamin C 65-125 MG TABS Take by mouth.    ? Multiple Vitamin (MULTI-VITAMINS) TABS Take by mouth.    ? naproxen sodium (ANAPROX) 220 MG tablet Take 440 mg by mouth 2 (two) times daily as needed.    ?  Semaglutide,0.25 or 0.'5MG'$ /DOS, (OZEMPIC, 0.25 OR 0.5 MG/DOSE,) 2 MG/1.5ML SOPN Inject 0.25 mg into the skin once a week. 1.5 mL 3  ? sildenafil (REVATIO) 20 MG tablet 2-5 tab po 1 hour prior to sex. 50 tablet 0  ? sildenafil (VIAGRA) 100 MG tablet Take 0.5-1 tablets (50-100 mg total) by mouth daily as needed for erectile dysfunction. 10 tablet 0  ? triamterene-hydrochlorothiazide (DYAZIDE) 37.5-25 MG capsule Take 1 each (1 capsule total) by mouth every morning. 90 capsule 3  ? UNABLE TO FIND Med Name: Tumeric root oral    ? ?No current facility-administered medications on file prior to visit.  ? ? ?BP 128/80   Pulse 88   Temp 98.3 ?F (36.8 ?C)   Resp 18   Ht '5\' 7"'$  (1.702 m)   Wt 193 lb 9.6 oz (87.8 kg)   SpO2 98%   BMI 30.32 kg/m?  ?  ?   ?Objective:  ? Physical Exam ? ?General ?Mental Status- Alert. General Appearance- Not in acute distress.  ? ?Skin ?General: Color- Normal Color. Moisture- Normal Moisture. ? ?Neck ?Carotid Arteries- Normal color. Moisture- Normal Moisture. No carotid bruits. No JVD. ? ?Chest and Lung Exam ?Auscultation: ?Breath Sounds:-Normal. ? ?Cardiovascular ?Auscultation:Rythm- Regular. ?Murmurs & Other Heart Sounds:Auscultation of the heart reveals- No Murmurs. ? ?Abdomen ?Inspection:-Inspeection Normal. ?Palpation/Percussion:Note:No mass. Palpation and Percussion of the abdomen reveal- Non Tender, Non Distended + BS, no rebound or guarding. ? ? ?Neurologic ?Cranial Nerve exam:- CN III-XII intact(No nystagmus), symmetric smile. ?Strength:- 5/5 equal and symmetric strength both upper and lower extremities.  ? ? ?   ?Assessment & Plan:  ? ?Patient Instructions  ?For you wellness exam today I have ordered cbc, cmp and lipid panel. ? ?Can get shingrix when we have in stock. Can call back later and arrange ? ?Will get a1c to follow and assess response to ozempic. ? ?Recommend exercise and healthy diet. ? ?We will let you know lab results as they come in. ? ?For depression with phq-9 score  of 7 prescribed wellbutrin. ? ?Follow up date appointment will be determined after lab review.   ? ? ? ? ? ? ? ? ? Mackie Pai, PA-C  ? ? ?[phone] as did address depression, diabetes and discussed weight loss. ? ?

## 2022-02-25 NOTE — Patient Instructions (Addendum)
For you wellness exam today I have ordered cbc, cmp and lipid panel. ? ?Can get shingrix when we have in stock. Can call back later and arrange ? ?Will get a1c to follow and assess response to ozempic. ? ?Recommend exercise and healthy diet. ? ?We will let you know lab results as they come in. ? ?For depression with phq-9 score of 7 prescribed wellbutrin. ? ?Follow up date appointment will be determined after lab review.   ? ? ? ? ? ? ? ? ? ? ? ?

## 2022-02-26 MED ORDER — OZEMPIC (0.25 OR 0.5 MG/DOSE) 2 MG/1.5ML ~~LOC~~ SOPN: 0.5000 mg | PEN_INJECTOR | SUBCUTANEOUS | 11 refills | Status: DC

## 2022-02-26 NOTE — Addendum Note (Signed)
Addended by: Anabel Halon on: 02/26/2022 10:09 AM ? ? Modules accepted: Orders ? ?

## 2022-02-28 ENCOUNTER — Telehealth: Payer: Self-pay

## 2022-02-28 NOTE — Telephone Encounter (Signed)
PA initiated via Covermymeds; KEY: [id number]. Awaiting determination.  ?

## 2022-03-02 NOTE — Telephone Encounter (Signed)
PA approved. Effective from 03/02/2022 through 03/03/2023 ?

## 2022-03-04 ENCOUNTER — Encounter: Payer: Self-pay | Admitting: Medical

## 2022-03-04 ENCOUNTER — Other Ambulatory Visit (HOSPITAL_BASED_OUTPATIENT_CLINIC_OR_DEPARTMENT_OTHER): Payer: Self-pay

## 2022-03-04 MED ORDER — OZEMPIC (0.25 OR 0.5 MG/DOSE) 2 MG/1.5ML ~~LOC~~ SOPN
0.5000 mg | PEN_INJECTOR | SUBCUTANEOUS | 11 refills | Status: DC
  Filled 2022-03-04: qty 1.5, 28d supply, fill #0
  Filled 2022-04-07 (×2): qty 1.5, 28d supply, fill #1

## 2022-03-31 ENCOUNTER — Other Ambulatory Visit: Payer: Self-pay | Admitting: Medical

## 2022-04-06 ENCOUNTER — Encounter: Payer: Self-pay | Admitting: Medical

## 2022-04-06 MED ORDER — METFORMIN HCL 500 MG PO TABS
500.0000 mg | ORAL_TABLET | Freq: Two times a day (BID) | ORAL | 3 refills | Status: DC
Start: 2022-04-06 — End: 2023-04-24

## 2022-04-06 NOTE — Addendum Note (Signed)
Addended by: Anabel Halon on: 04/06/2022 02:50 PM   Modules accepted: Orders

## 2022-04-07 ENCOUNTER — Other Ambulatory Visit (HOSPITAL_BASED_OUTPATIENT_CLINIC_OR_DEPARTMENT_OTHER): Payer: Self-pay

## 2022-04-07 MED ORDER — OZEMPIC (0.25 OR 0.5 MG/DOSE) 2 MG/3ML ~~LOC~~ SOPN
PEN_INJECTOR | SUBCUTANEOUS | 11 refills | Status: DC
  Filled 2022-04-07: qty 3, 28d supply, fill #0
  Filled 2022-04-30: qty 3, 28d supply, fill #1
  Filled 2022-06-08: qty 3, 28d supply, fill #2
  Filled 2022-07-02: qty 3, 28d supply, fill #3
  Filled 2022-08-13: qty 3, 28d supply, fill #4
  Filled 2022-09-08: qty 3, 28d supply, fill #5
  Filled 2022-10-03: qty 3, 28d supply, fill #6
  Filled 2022-11-05: qty 3, 28d supply, fill #7
  Filled 2022-12-08 – 2022-12-09 (×2): qty 3, 28d supply, fill #8
  Filled 2023-01-01: qty 3, 28d supply, fill #9
  Filled 2023-02-08: qty 3, 28d supply, fill #10

## 2022-05-02 ENCOUNTER — Other Ambulatory Visit (HOSPITAL_BASED_OUTPATIENT_CLINIC_OR_DEPARTMENT_OTHER): Payer: Self-pay

## 2022-05-30 ENCOUNTER — Ambulatory Visit (INDEPENDENT_AMBULATORY_CARE_PROVIDER_SITE_OTHER): Payer: Managed Care, Other (non HMO) | Admitting: Medical

## 2022-05-30 ENCOUNTER — Encounter: Payer: Self-pay | Admitting: Medical

## 2022-05-30 VITALS — BP 130/70 | HR 82 | Temp 98.4°F | Resp 18 | Ht 67.0 in | Wt 191.0 lb

## 2022-05-30 DIAGNOSIS — H109 Unspecified conjunctivitis: Secondary | ICD-10-CM

## 2022-05-30 DIAGNOSIS — R972 Elevated prostate specific antigen [PSA]: Secondary | ICD-10-CM | POA: Diagnosis not present

## 2022-05-30 DIAGNOSIS — E119 Type 2 diabetes mellitus without complications: Secondary | ICD-10-CM | POA: Diagnosis not present

## 2022-05-30 DIAGNOSIS — R21 Rash and other nonspecific skin eruption: Secondary | ICD-10-CM | POA: Diagnosis not present

## 2022-05-30 DIAGNOSIS — Z125 Encounter for screening for malignant neoplasm of prostate: Secondary | ICD-10-CM | POA: Diagnosis not present

## 2022-05-30 DIAGNOSIS — E785 Hyperlipidemia, unspecified: Secondary | ICD-10-CM

## 2022-05-30 LAB — PSA: PSA: 2.32 ng/mL (ref 0.10–4.00)

## 2022-05-30 LAB — HEMOGLOBIN A1C: Hgb A1c MFr Bld: 5.8 % (ref 4.6–6.5)

## 2022-05-30 MED ORDER — DOXYCYCLINE HYCLATE 100 MG PO TABS: 100.0000 mg | ORAL_TABLET | Freq: Two times a day (BID) | ORAL | 0 refills | Status: DC

## 2022-05-30 MED ORDER — FAMCICLOVIR 500 MG PO TABS: 500.0000 mg | ORAL_TABLET | Freq: Three times a day (TID) | ORAL | 0 refills | Status: DC

## 2022-05-30 MED ORDER — TOBRAMYCIN 0.3 % OP SOLN: 2.0000 [drp] | OPHTHALMIC | 0 refills | Status: DC

## 2022-05-30 NOTE — Progress Notes (Signed)
Subjective:    Patient ID: Craig House, male    DOB: 05-15-1961, 61 y.o.   MRN: [id number]  HPI Pt in with rt upper eye lid swollen for 5 days. Pt states last Monday medial canthus area was irritated and states the rt lid was so swollen so much could not open eye but since then lid is less swollen. He described clear stickiness to lids and had to use warm compress to break up the matting.  Since eye is now open no visions issues or orbit pain.  Rt side upper chest rash came on possibly at same time. Though he is not sure.   Before these symptoms he was working in yard. Cleaning pollen off patio furniture and pulling weeds from side of house. Working on son house.  Pt is diabetic.   Review of Systems  Constitutional:  Negative for chills, fatigue and fever.  Eyes:        See hpi.  Respiratory:  Negative for cough, chest tightness and wheezing.   Cardiovascular:  Negative for chest pain.  Gastrointestinal:  Negative for abdominal pain.  Musculoskeletal:  Negative for back pain and myalgias.  Skin:  Positive for rash. Negative for wound.  Neurological:  Negative for dizziness, light-headedness and headaches.  Hematological:  Negative for adenopathy. Does not bruise/bleed easily.  Psychiatric/Behavioral:  Negative for sleep disturbance. The patient is not nervous/anxious.     Past Medical History:  Diagnosis Date   Allergy    spring   Anemia    Asthma    Hyperplastic colon polyp    Hypertension    Obesity      Social History   Socioeconomic History   Marital status: Divorced    Spouse name: Not on file   Number of children: Not on file   Years of education: Not on file   Highest education level: Not on file  Occupational History   Not on file  Tobacco Use   Smoking status: Former   Smokeless tobacco: Never  Vaping Use   Vaping Use: Never used  Substance and Sexual Activity   Alcohol use: Yes    Alcohol/week: 1.0 standard drink of alcohol    Types: 1  Standard drinks or equivalent per week    Comment: 1 glass of red wine at night   Drug use: No   Sexual activity: Yes  Other Topics Concern   Not on file  Social History Narrative   Not on file   Social Determinants of Health   Financial Resource Strain: Not on file  Food Insecurity: Not on file  Transportation Needs: Not on file  Physical Activity: Not on file  Stress: Not on file  Social Connections: Not on file  Intimate Partner Violence: Not on file    Past Surgical History:  Procedure Laterality Date   KNEE ARTHROSCOPY Bilateral     Family History  Problem Relation Age of Onset   Heart attack Mother    Cancer Father    Heart disease Father    Colon cancer Neg Hx     No Known Allergies  Current Outpatient Medications on File Prior to Visit  Medication Sig Dispense Refill   albuterol (VENTOLIN HFA) 108 (90 Base) MCG/ACT inhaler INHALE 2 PUFFS BY MOUTH EVERY 6 HOURS AS NEEDED FOR WHEEZING OR SHORTNESS OF BREATH 8.5 g 0   amLODipine (NORVASC) 10 MG tablet 1 tab po q day 90 tablet 3   aspirin EC 81 MG tablet Take by  mouth.     atorvastatin (LIPITOR) 20 MG tablet TAKE 1 TABLET BY MOUTH EVERY DAY 90 tablet 3   buPROPion (WELLBUTRIN XL) 150 MG 24 hr tablet Take 1 tablet (150 mg total) by mouth daily. 30 tablet 0   cefdinir (OMNICEF) 300 MG capsule Take 1 capsule (300 mg total) by mouth 2 (two) times daily. 20 capsule 0   COVID-19 mRNA bivalent vaccine, Pfizer, (PFIZER COVID-19 VAC BIVALENT) injection Inject into the muscle. 0.3 mL 0   fluticasone (FLONASE) 50 MCG/ACT nasal spray Place 2 sprays into both nostrils daily. 16 g 1   hydrOXYzine (ATARAX/VISTARIL) 10 MG tablet 1 tab po q 8 hrs prn itching 30 tablet 0   Iron-Vitamin C 65-125 MG TABS Take by mouth.     metFORMIN (GLUCOPHAGE) 500 MG tablet Take 1 tablet (500 mg total) by mouth 2 (two) times daily with a meal. 180 tablet 3   Multiple Vitamin (MULTI-VITAMINS) TABS Take by mouth.     naproxen sodium (ANAPROX) 220 MG  tablet Take 440 mg by mouth 2 (two) times daily as needed.     Semaglutide,0.25 or 0.'5MG'$ /DOS, (OZEMPIC, 0.25 OR 0.5 MG/DOSE,) 2 MG/3ML SOPN Inject 0.'5mg'$  into the skin once weekly 3 mL 11   sildenafil (REVATIO) 20 MG tablet 2-5 tab po 1 hour prior to sex. 50 tablet 0   sildenafil (VIAGRA) 100 MG tablet Take 0.5-1 tablets (50-100 mg total) by mouth daily as needed for erectile dysfunction. 10 tablet 0   triamterene-hydrochlorothiazide (DYAZIDE) 37.5-25 MG capsule Take 1 each (1 capsule total) by mouth every morning. 90 capsule 3   UNABLE TO FIND Med Name: Tumeric root oral     No current facility-administered medications on file prior to visit.    BP (!) 144/86   Pulse 82   Temp 98.4 F (36.9 C)   Resp 18   Ht '5\' 7"'$  (1.702 m)   Wt 191 lb (86.6 kg)   SpO2 99%   BMI 29.91 kg/m        Objective:   Physical Exam  General- No acute distress. Pleasant patient. Neck- Full range of motion, no jvd Lungs- Clear, even and unlabored. Heart- regular rate and rhythm. Neurologic- CNII- XII grossly intact.  Rt eye- upper lid mild swelling. No matting. No dc. No vesicle eruption. Skin- mild raised rash in dermatomal pattern rt upper chest. But no vesicle.        Assessment & Plan:   Patient Instructions  Rt eye conjunctivitis- improved compared to Monday description but still swollen rt upper eye lid. Rx doxycycline oral antibiotic and tobrex eye drops.  Rt famvir in event of shingles eruption. In event of allergy use claritin daily.  Will get lipid panel today as well.  If rash on chest were to spread might consider low dose medrol. But would avoid that with your diabetes.  For diabetes will get a1c and cmp. Currently on 0.5 mg weekly injection.  Psa added due to family hx and increased prostate velocity.  Follow up in 7 days or sooner if needed          [employer], PA-C

## 2022-05-30 NOTE — Patient Instructions (Addendum)
Rt eye conjunctivitis- improved compared to Monday description but still swollen rt upper eye lid. Rx doxycycline oral antibiotic and tobrex eye drops.  Rt famvir in event of shingles eruption. In event of allergy use claritin daily.  Will get lipid panel today as well.  If rash on chest were to spread might consider low dose medrol. But would avoid that with your diabetes.  For diabetes will get a1c and cmp. Currently on 0.5 mg weekly injection.  Psa added due to family hx and increased prostate velocity.  Follow up in 7 days or sooner if needed

## 2022-05-31 LAB — COMPREHENSIVE METABOLIC PANEL
ALT: 36 U/L (ref 0–53)
AST: 22 U/L (ref 0–37)
Albumin: 4.7 g/dL (ref 3.5–5.2)
Alkaline Phosphatase: 51 U/L (ref 39–117)
BUN: 16 mg/dL (ref 6–23)
CO2: 27 mEq/L (ref 19–32)
Calcium: 10 mg/dL (ref 8.4–10.5)
Chloride: 98 mEq/L (ref 96–112)
Creatinine, Ser: 1.34 mg/dL (ref 0.40–1.50)
GFR: 56.76 mL/min — ABNORMAL LOW (ref >60.00)
Glucose, Bld: 99 mg/dL (ref 70–99)
Potassium: 3.7 mEq/L (ref 3.5–5.1)
Sodium: 136 mEq/L (ref 135–145)
Total Bilirubin: 0.8 mg/dL (ref 0.2–1.2)
Total Protein: 7.9 g/dL (ref 6.0–8.3)

## 2022-05-31 LAB — LIPID PANEL
Cholesterol: 144 mg/dL (ref 0–200)
HDL: 43.5 mg/dL (ref >39.00)
LDL Cholesterol: 81 mg/dL (ref 0–99)
NonHDL: 100.73
Total CHOL/HDL Ratio: 3
Triglycerides: 101 mg/dL (ref 0.0–149.0)
VLDL: 20.2 mg/dL (ref 0.0–40.0)

## 2022-06-06 ENCOUNTER — Ambulatory Visit (INDEPENDENT_AMBULATORY_CARE_PROVIDER_SITE_OTHER): Payer: Managed Care, Other (non HMO) | Admitting: Medical

## 2022-06-06 VITALS — BP 140/84 | HR 98 | Resp 18 | Ht 67.0 in | Wt 179.0 lb

## 2022-06-06 DIAGNOSIS — E119 Type 2 diabetes mellitus without complications: Secondary | ICD-10-CM

## 2022-06-06 DIAGNOSIS — T7840XA Allergy, unspecified, initial encounter: Secondary | ICD-10-CM | POA: Diagnosis not present

## 2022-06-06 MED ORDER — NOVOLOG FLEXPEN RELION 100 UNIT/ML ~~LOC~~ SOPN: PEN_INJECTOR | SUBCUTANEOUS | 11 refills | Status: DC

## 2022-06-06 MED ORDER — DOXYCYCLINE HYCLATE 100 MG PO TABS: 100.0000 mg | ORAL_TABLET | Freq: Two times a day (BID) | ORAL | 0 refills | Status: DC

## 2022-06-06 MED ORDER — CEFTRIAXONE SODIUM 1 G IJ SOLR
1.0000 g | Freq: Once | INTRAMUSCULAR | Status: AC
  Administered 2022-06-06: 1 g via INTRAMUSCULAR

## 2022-06-06 MED ORDER — PREDNISONE 10 MG (21) PO TBPK: ORAL_TABLET | ORAL | 0 refills | Status: DC

## 2022-06-06 NOTE — Progress Notes (Signed)
Subjective:    Patient ID: Craig House, male    DOB: 08-Jan-1961, 61 y.o.   MRN: [id number]  HPI  Pt in for persisting rt eye upper eye lid still swollen. See last visit. Pt he is getting yellow dc in the morning. The lid is more swollen both upper and lower lid. Since last visit his left eye is also getting rash is itching. See below last visit hpi.  "Pt in with rt upper eye lid swollen for 5 days. Pt states last Monday medial canthus area was irritated and states the rt lid was so swollen so much could not open eye but since then lid is less swollen. He described clear stickiness to lids and had to use warm compress to break up the matting.   Since eye is now open no visions issues or orbit pain."  Last visit exam. Rt eye- upper lid mild swelling. No matting. No dc. No vesicle eruption. Skin- mild raised rash in dermatomal pattern rt upper chest. But no vesicle.  I had treat pt with doxycline for rt eye conjnctivitis. Also rx famvir in event rt upper chest rash worsened. He did go ahead and start the famvir.      Rt side upper chest rash came on possibly at same time. Though he is not sure.    Before these symptoms he was working in yard. Cleaning pollen off patio furniture and pulling weeds from side of house. Working on son house."   Pt has concern that this could be reaction to ozempic. He has been on ozempic for months. Last a1c was 5.8.       Review of Systems  Constitutional:  Negative for chills, fatigue and fever.  Eyes:  Positive for discharge. Negative for photophobia, pain, redness and visual disturbance.  Respiratory:  Negative for chest tightness, shortness of breath and wheezing.   Cardiovascular:  Negative for chest pain and palpitations.  Gastrointestinal:  Negative for abdominal pain, nausea and vomiting.  Genitourinary:  Negative for dysuria, flank pain and frequency.  Musculoskeletal:  Negative for back pain and neck pain.  Skin:  Positive for  rash.       See hpi.    Past Medical History:  Diagnosis Date   Allergy    spring   Anemia    Asthma    Hyperplastic colon polyp    Hypertension    Obesity      Social History   Socioeconomic History   Marital status: Divorced    Spouse name: Not on file   Number of children: Not on file   Years of education: Not on file   Highest education level: Not on file  Occupational History   Not on file  Tobacco Use   Smoking status: Former   Smokeless tobacco: Never  Vaping Use   Vaping Use: Never used  Substance and Sexual Activity   Alcohol use: Yes    Alcohol/week: 1.0 standard drink of alcohol    Types: 1 Standard drinks or equivalent per week    Comment: 1 glass of red wine at night   Drug use: No   Sexual activity: Yes  Other Topics Concern   Not on file  Social History Narrative   Not on file   Social Determinants of Health   Financial Resource Strain: Not on file  Food Insecurity: Not on file  Transportation Needs: Not on file  Physical Activity: Not on file  Stress: Not on file  Social Connections:  Not on file  Intimate Partner Violence: Not on file    Past Surgical History:  Procedure Laterality Date   KNEE ARTHROSCOPY Bilateral     Family History  Problem Relation Age of Onset   Heart attack Mother    Cancer Father    Heart disease Father    Colon cancer Neg Hx     No Known Allergies  Current Outpatient Medications on File Prior to Visit  Medication Sig Dispense Refill   albuterol (VENTOLIN HFA) 108 (90 Base) MCG/ACT inhaler INHALE 2 PUFFS BY MOUTH EVERY 6 HOURS AS NEEDED FOR WHEEZING OR SHORTNESS OF BREATH 8.5 g 0   amLODipine (NORVASC) 10 MG tablet 1 tab po q day 90 tablet 3   aspirin EC 81 MG tablet Take by mouth.     atorvastatin (LIPITOR) 20 MG tablet TAKE 1 TABLET BY MOUTH EVERY DAY 90 tablet 3   buPROPion (WELLBUTRIN XL) 150 MG 24 hr tablet Take 1 tablet (150 mg total) by mouth daily. 30 tablet 0   cefdinir (OMNICEF) 300 MG capsule  Take 1 capsule (300 mg total) by mouth 2 (two) times daily. 20 capsule 0   COVID-19 mRNA bivalent vaccine, Pfizer, (PFIZER COVID-19 VAC BIVALENT) injection Inject into the muscle. 0.3 mL 0   doxycycline (VIBRA-TABS) 100 MG tablet Take 1 tablet (100 mg total) by mouth 2 (two) times daily. 20 tablet 0   famciclovir (FAMVIR) 500 MG tablet Take 1 tablet (500 mg total) by mouth 3 (three) times daily. 21 tablet 0   fluticasone (FLONASE) 50 MCG/ACT nasal spray Place 2 sprays into both nostrils daily. 16 g 1   hydrOXYzine (ATARAX/VISTARIL) 10 MG tablet 1 tab po q 8 hrs prn itching 30 tablet 0   Iron-Vitamin C 65-125 MG TABS Take by mouth.     metFORMIN (GLUCOPHAGE) 500 MG tablet Take 1 tablet (500 mg total) by mouth 2 (two) times daily with a meal. 180 tablet 3   Multiple Vitamin (MULTI-VITAMINS) TABS Take by mouth.     naproxen sodium (ANAPROX) 220 MG tablet Take 440 mg by mouth 2 (two) times daily as needed.     Semaglutide,0.25 or 0.'5MG'$ /DOS, (OZEMPIC, 0.25 OR 0.5 MG/DOSE,) 2 MG/3ML SOPN Inject 0.'5mg'$  into the skin once weekly 3 mL 11   sildenafil (REVATIO) 20 MG tablet 2-5 tab po 1 hour prior to sex. 50 tablet 0   sildenafil (VIAGRA) 100 MG tablet Take 0.5-1 tablets (50-100 mg total) by mouth daily as needed for erectile dysfunction. 10 tablet 0   tobramycin (TOBREX) 0.3 % ophthalmic solution Place 2 drops into both eyes every 4 (four) hours. 5 mL 0   triamterene-hydrochlorothiazide (DYAZIDE) 37.5-25 MG capsule Take 1 each (1 capsule total) by mouth every morning. 90 capsule 3   UNABLE TO FIND Med Name: Tumeric root oral     No current facility-administered medications on file prior to visit.    BP 140/84   Pulse 98   Resp 18   Ht '5\' 7"'$  (1.702 m)   Wt 179 lb (81.2 kg)   SpO2 100%   BMI 28.04 kg/m        Objective:   Physical Exam  General- No acute distress. Pleasant patient. Neck- Full range of motion, no jvd Lungs- Clear, even and unlabored. Heart- regular rate and  rhythm. Neurologic- CNII- XII grossly intact.  Heent- no frontal or maxillary sinus pressure.  Rt eye- upper and lower eye lid hyperpigmented with dry appearance to skin and mild puffy. No  warmth. No eye discharge or matting presently. No periorbital vesicle eruption. Left eye- mild puffy hyperpigmented rash to lower lid.       Assessment & Plan:   Patient Instructions  Allergic reaction with secondary skin infection.  I do think underlying issue is most likely allergic reaction particularly since the left lower eyelid area now has a rash.  Recommend Rocephin IM injection for secondary infection and continuing doxycycline for additional 4 days.  Will prescribe 6 days of tapered prednisone.  Since you are diabetic we need to be careful and have you check your sugars before each meal.  If your sugars are over 200 then follow sliding scale mealtime insulin as listed below.  Sent NovoLog to your pharmacy.  Can hold Ozempic while on NovoLog.   Schedule below.  181 to 201 3 units 201- 250 5 units 251 to 308  8 units 301 to 350 10 units 351- 400 12 units.  If with above treatment you see any mild blister outbreak notify me.  Previously used Famvir and need to know if you were to get new blisters.  Please notify me immediately if any blister eruption around the eyes.  Follow-up Thursday morning or sooner if needed.   Mackie Pai, PA-C

## 2022-06-06 NOTE — Patient Instructions (Addendum)
Allergic reaction with secondary skin infection rt periorbital area.  I do think underlying issue is most likely allergic reaction particularly since the left lower eyelid area now has a rash.  Recommend Rocephin IM injection for secondary infection and continuing doxycycline for additional 4 days.  Will prescribe 6 days of tapered prednisone.  Since you are diabetic we need to be careful and have you check your sugars before each meal.  If your sugars are over 200 then follow sliding scale mealtime insulin as listed below.  Sent NovoLog to your pharmacy.  Can hold Ozempic while on NovoLog.   Schedule below.  181 to 201 3 units 201- 250 5 units 251 to 308  8 units 301 to 350 10 units 351- 400 12 units.  If with above treatment you see any mild blister outbreak notify me.  Previously used Famvir and need to know if you were to get new blisters.  Please notify me immediately if any blister eruption around the eyes.  Follow-up Thursday morning or sooner if needed.

## 2022-06-09 ENCOUNTER — Other Ambulatory Visit (HOSPITAL_BASED_OUTPATIENT_CLINIC_OR_DEPARTMENT_OTHER): Payer: Self-pay

## 2022-06-09 ENCOUNTER — Ambulatory Visit (INDEPENDENT_AMBULATORY_CARE_PROVIDER_SITE_OTHER): Payer: Managed Care, Other (non HMO) | Admitting: Medical

## 2022-06-09 VITALS — BP 116/72 | HR 98 | Resp 18 | Ht 67.0 in | Wt 179.0 lb

## 2022-06-09 DIAGNOSIS — E119 Type 2 diabetes mellitus without complications: Secondary | ICD-10-CM | POA: Diagnosis not present

## 2022-06-09 DIAGNOSIS — L089 Local infection of the skin and subcutaneous tissue, unspecified: Secondary | ICD-10-CM

## 2022-06-09 DIAGNOSIS — T7840XD Allergy, unspecified, subsequent encounter: Secondary | ICD-10-CM | POA: Diagnosis not present

## 2022-06-09 NOTE — Progress Notes (Signed)
Subjective:    Patient ID: Craig House, male    DOB: [birth year], 61 y.o.   MRN: [id number]  HPI  Pt in for follow up.  On last visit A/P   " Patient Instructions  Allergic reaction with secondary skin infection.  I do think underlying issue is most likely allergic reaction particularly since the left lower eyelid area now has a rash.  Recommend Rocephin IM injection for secondary infection and continuing doxycycline for additional 4 days.  Will prescribe 6 days of tapered prednisone.  Since you are diabetic we need to be careful and have you check your sugars before each meal.  If your sugars are over 200 then follow sliding scale mealtime insulin as listed below.  Sent NovoLog to your pharmacy."   The skin is much improved. Less puffiness and much less red. The lids are now not swollen and dry.   With recent prednisone use sugar recently increased to mid 200 level. He has used the sliding scale novolog.    Review of Systems  Constitutional:  Negative for chills, fatigue and fever.  HENT:  Negative for congestion and drooling.   Respiratory:  Negative for apnea, cough, choking and wheezing.   Cardiovascular:  Negative for chest pain and palpitations.  Gastrointestinal:  Negative for abdominal pain.  Musculoskeletal:  Negative for back pain.  Neurological:  Negative for dizziness, seizures, syncope, weakness and headaches.  Hematological:  Negative for adenopathy. Does not bruise/bleed easily.  Psychiatric/Behavioral:  Negative for behavioral problems and confusion.     Past Medical History:  Diagnosis Date   Allergy    spring   Anemia    Asthma    Hyperplastic colon polyp    Hypertension    Obesity      Social History   Socioeconomic History   Marital status: Divorced    Spouse name: Not on file   Number of children: Not on file   Years of education: Not on file   Highest education level: Not on file  Occupational History   Not on file  Tobacco Use    Smoking status: Former   Smokeless tobacco: Never  Vaping Use   Vaping Use: Never used  Substance and Sexual Activity   Alcohol use: Yes    Alcohol/week: 1.0 standard drink of alcohol    Types: 1 Standard drinks or equivalent per week    Comment: 1 glass of red wine at night   Drug use: No   Sexual activity: Yes  Other Topics Concern   Not on file  Social History Narrative   Not on file   Social Determinants of Health   Financial Resource Strain: Not on file  Food Insecurity: Not on file  Transportation Needs: Not on file  Physical Activity: Not on file  Stress: Not on file  Social Connections: Not on file  Intimate Partner Violence: Not on file    Past Surgical History:  Procedure Laterality Date   KNEE ARTHROSCOPY Bilateral     Family History  Problem Relation Age of Onset   Heart attack Mother    Cancer Father    Heart disease Father    Colon cancer Neg Hx     No Known Allergies  Current Outpatient Medications on File Prior to Visit  Medication Sig Dispense Refill   albuterol (VENTOLIN HFA) 108 (90 Base) MCG/ACT inhaler INHALE 2 PUFFS BY MOUTH EVERY 6 HOURS AS NEEDED FOR WHEEZING OR SHORTNESS OF BREATH 8.5 g 0  amLODipine (NORVASC) 10 MG tablet 1 tab po q day 90 tablet 3   aspirin EC 81 MG tablet Take by mouth.     atorvastatin (LIPITOR) 20 MG tablet TAKE 1 TABLET BY MOUTH EVERY DAY 90 tablet 3   buPROPion (WELLBUTRIN XL) 150 MG 24 hr tablet Take 1 tablet (150 mg total) by mouth daily. 30 tablet 0   cefdinir (OMNICEF) 300 MG capsule Take 1 capsule (300 mg total) by mouth 2 (two) times daily. 20 capsule 0   COVID-19 mRNA bivalent vaccine, Pfizer, (PFIZER COVID-19 VAC BIVALENT) injection Inject into the muscle. 0.3 mL 0   doxycycline (VIBRA-TABS) 100 MG tablet Take 1 tablet (100 mg total) by mouth 2 (two) times daily. 20 tablet 0   doxycycline (VIBRA-TABS) 100 MG tablet Take 1 tablet (100 mg total) by mouth 2 (two) times daily. 8 tablet 0   famciclovir  (FAMVIR) 500 MG tablet Take 1 tablet (500 mg total) by mouth 3 (three) times daily. 21 tablet 0   fluticasone (FLONASE) 50 MCG/ACT nasal spray Place 2 sprays into both nostrils daily. 16 g 1   hydrOXYzine (ATARAX/VISTARIL) 10 MG tablet 1 tab po q 8 hrs prn itching 30 tablet 0   insulin aspart (NOVOLOG FLEXPEN) 100 UNIT/ML FlexPen 4 units injection with  each meal tid 15 mL 11   Iron-Vitamin C 65-125 MG TABS Take by mouth.     metFORMIN (GLUCOPHAGE) 500 MG tablet Take 1 tablet (500 mg total) by mouth 2 (two) times daily with a meal. 180 tablet 3   Multiple Vitamin (MULTI-VITAMINS) TABS Take by mouth.     naproxen sodium (ANAPROX) 220 MG tablet Take 440 mg by mouth 2 (two) times daily as needed.     predniSONE (STERAPRED UNI-PAK 21 TAB) 10 MG (21) TBPK tablet Standard Taper over 6 days. 21 tablet 0   Semaglutide,0.25 or 0.'5MG'$ /DOS, (OZEMPIC, 0.25 OR 0.5 MG/DOSE,) 2 MG/3ML SOPN Inject 0.'5mg'$  into the skin once weekly 3 mL 11   sildenafil (REVATIO) 20 MG tablet 2-5 tab po 1 hour prior to sex. 50 tablet 0   sildenafil (VIAGRA) 100 MG tablet Take 0.5-1 tablets (50-100 mg total) by mouth daily as needed for erectile dysfunction. 10 tablet 0   tobramycin (TOBREX) 0.3 % ophthalmic solution Place 2 drops into both eyes every 4 (four) hours. 5 mL 0   triamterene-hydrochlorothiazide (DYAZIDE) 37.5-25 MG capsule Take 1 each (1 capsule total) by mouth every morning. 90 capsule 3   UNABLE TO FIND Med Name: Tumeric root oral     No current facility-administered medications on file prior to visit.    BP 116/72 Comment: today at home.  Pulse 98   Resp 18   Ht '5\' 7"'$  (1.702 m)   Wt 179 lb (81.2 kg)   SpO2 100%   BMI 28.04 kg/m        Objective:   Physical Exam  General- No acute distress. Pleasant patient. Neck- Full range of motion, no jvd Lungs- Clear, even and unlabored. Heart- regular rate and rhythm. Neurologic- CNII- XII grossly intact.  Heent- no frontal or maxillary sinus pressure.  Rt eye-  upper and lower eye lid hyperpigmented with dry appearance to skin. Prior swollen appearance resolved. No warmth. No eye discharge or matting presently. No periorbital vesicle eruption. Left eye- no longer puffy but still hyperpigmented rash to lower lid.       Assessment & Plan:   Patient Instructions  Allergic reaction with seconday skin infection. Appear to be  improving with doxycycline antibiotic and tapered prednisone. Now only residual dry and hyperpigmented appearance to lids.   Continue both above meds.  Can use some otc moisturizing with vit E to lids.   Continue to use novolog meal time sliding scale insulin if needed for sugars above 200 while using prednisone.  Can restart ozempic on last day of prednisone.   Follow up late October or sooner if needed,   [employer], PA-C

## 2022-06-09 NOTE — Patient Instructions (Addendum)
Allergic reaction with secondary skin infection. Appear to be improving with doxycycline antibiotic and tapered prednisone. Now only residual dry and hyperpigmented appearance to lids.   Continue both above meds.  Can use some otc moisturizing with vit E to lids.   Continue to use novolog meal time sliding scale insulin if needed for sugars above 200 while using prednisone.  Can restart ozempic on last day of prednisone.   Follow up late October or sooner if needed,

## 2022-06-11 ENCOUNTER — Other Ambulatory Visit: Payer: Self-pay | Admitting: Medical

## 2022-06-13 ENCOUNTER — Encounter: Payer: Self-pay | Admitting: Medical

## 2022-06-13 ENCOUNTER — Other Ambulatory Visit: Payer: Self-pay | Admitting: Medical

## 2022-06-13 MED ORDER — METHYLPREDNISOLONE 4 MG PO TABS: 4.0000 mg | ORAL_TABLET | Freq: Every day | ORAL | 0 refills | Status: DC

## 2022-06-13 NOTE — Addendum Note (Signed)
Addended by: Anabel Halon on: 06/13/2022 12:53 PM   Modules accepted: Orders

## 2022-07-04 ENCOUNTER — Other Ambulatory Visit (HOSPITAL_BASED_OUTPATIENT_CLINIC_OR_DEPARTMENT_OTHER): Payer: Self-pay

## 2022-08-15 ENCOUNTER — Other Ambulatory Visit (HOSPITAL_BASED_OUTPATIENT_CLINIC_OR_DEPARTMENT_OTHER): Payer: Self-pay

## 2022-09-08 ENCOUNTER — Other Ambulatory Visit (HOSPITAL_BASED_OUTPATIENT_CLINIC_OR_DEPARTMENT_OTHER): Payer: Self-pay

## 2022-09-09 ENCOUNTER — Ambulatory Visit (INDEPENDENT_AMBULATORY_CARE_PROVIDER_SITE_OTHER): Payer: Managed Care, Other (non HMO) | Admitting: Medical

## 2022-09-09 VITALS — BP 130/80 | HR 87 | Temp 98.5°F | Resp 18 | Ht 67.0 in | Wt 186.0 lb

## 2022-09-09 DIAGNOSIS — E119 Type 2 diabetes mellitus without complications: Secondary | ICD-10-CM | POA: Diagnosis not present

## 2022-09-09 DIAGNOSIS — I10 Essential (primary) hypertension: Secondary | ICD-10-CM

## 2022-09-09 DIAGNOSIS — Z23 Encounter for immunization: Secondary | ICD-10-CM | POA: Diagnosis not present

## 2022-09-09 DIAGNOSIS — E785 Hyperlipidemia, unspecified: Secondary | ICD-10-CM | POA: Diagnosis not present

## 2022-09-09 DIAGNOSIS — H60331 Swimmer's ear, right ear: Secondary | ICD-10-CM

## 2022-09-09 DIAGNOSIS — G8929 Other chronic pain: Secondary | ICD-10-CM

## 2022-09-09 DIAGNOSIS — M25562 Pain in left knee: Secondary | ICD-10-CM

## 2022-09-09 LAB — COMPREHENSIVE METABOLIC PANEL
ALT: 25 U/L (ref 0–53)
AST: 18 U/L (ref 0–37)
Albumin: 4.4 g/dL (ref 3.5–5.2)
Alkaline Phosphatase: 67 U/L (ref 39–117)
BUN: 23 mg/dL (ref 6–23)
CO2: 28 mEq/L (ref 19–32)
Calcium: 9.9 mg/dL (ref 8.4–10.5)
Chloride: 99 mEq/L (ref 96–112)
Creatinine, Ser: 1.34 mg/dL (ref 0.40–1.50)
GFR: 56.65 mL/min — ABNORMAL LOW (ref >60.00)
Glucose, Bld: 117 mg/dL — ABNORMAL HIGH (ref 70–99)
Potassium: 3.9 mEq/L (ref 3.5–5.1)
Sodium: 135 mEq/L (ref 135–145)
Total Bilirubin: 0.7 mg/dL (ref 0.2–1.2)
Total Protein: 7.8 g/dL (ref 6.0–8.3)

## 2022-09-09 LAB — MICROALBUMIN / CREATININE URINE RATIO
Creatinine,U: 152.7 mg/dL
Microalb Creat Ratio: 0.5 mg/g (ref 0.0–30.0)
Microalb, Ur: <0.7 mg/dL (ref 0.0–1.9)

## 2022-09-09 LAB — LIPID PANEL
Cholesterol: 132 mg/dL (ref 0–200)
HDL: 36.6 mg/dL — ABNORMAL LOW (ref >39.00)
LDL Cholesterol: 79 mg/dL (ref 0–99)
NonHDL: 95.79
Total CHOL/HDL Ratio: 4
Triglycerides: 86 mg/dL (ref 0.0–149.0)
VLDL: 17.2 mg/dL (ref 0.0–40.0)

## 2022-09-09 LAB — HEMOGLOBIN A1C: Hgb A1c MFr Bld: 7.5 % — ABNORMAL HIGH (ref 4.6–6.5)

## 2022-09-09 MED ORDER — NEOMYCIN-POLYMYXIN-HC 3.5-10000-1 OT SOLN: 3.0000 [drp] | Freq: Four times a day (QID) | OTIC | 0 refills | Status: DC

## 2022-09-09 NOTE — Progress Notes (Signed)
Subjective:    Patient ID: Craig House, male    DOB: February 09, 1961, 61 y.o.   MRN: [id number]  HPI  Pt in for follow up. We discussed not in for wellness exam.  Saw him 3 months ago.   Last visit A/P  He update me that he saw eye MD. Who gave steroid eye drop.   Pt last a1c was 5.8  pt is on ozempic  0.5 weekly injection. Pt currently eating healthy diet and less amount. Has some eight loss since last year of about 13 lbs.  Htn- on amlodipine and dyzide. Pt bp readins at home more tightly controlled. On lipitor 20 mg daily.  Rt ear sensation of moisture. Pt thinks. Maybe excess wax. No pain.   Left knee pain for year. Some day up to 10/10. Other days just 3. Pt request referral to orthopedist. Alleve and tylenol help some.  2019 xray. 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 No acute finding.   Review of Systems  Constitutional:  Negative for chills, fatigue and fever.  Respiratory:  Negative for cough, chest tightness, shortness of breath and wheezing.   Cardiovascular:  Negative for chest pain and palpitations.  Gastrointestinal:  Negative for abdominal pain.  Genitourinary:  Negative for dysuria, flank pain and frequency.  Musculoskeletal:  Negative for back pain and joint swelling.  Neurological:  Negative for dizziness, speech difficulty, weakness, numbness and headaches.  Hematological:  Negative for adenopathy. Does not bruise/bleed easily.  Psychiatric/Behavioral:  Negative for behavioral problems, confusion and decreased concentration.     Past Medical History:  Diagnosis Date   Allergy    spring   Anemia    Asthma    Hyperplastic colon polyp    Hypertension    Obesity      Social History   Socioeconomic History   Marital status: Divorced    Spouse name: Not on file   Number of children: Not on file   Years of education: Not on file   Highest education level: Not on file  Occupational History   Not  on file  Tobacco Use   Smoking status: Former   Smokeless tobacco: Never  Vaping Use   Vaping Use: Never used  Substance and Sexual Activity   Alcohol use: Yes    Alcohol/week: 1.0 standard drink of alcohol    Types: 1 Standard drinks or equivalent per week    Comment: 1 glass of red wine at night   Drug use: No   Sexual activity: Yes  Other Topics Concern   Not on file  Social History Narrative   Not on file   Social Determinants of Health   Financial Resource Strain: Not on file  Food Insecurity: Not on file  Transportation Needs: Not on file  Physical Activity: Not on file  Stress: Not on file  Social Connections: Not on file  Intimate Partner Violence: Not on file    Past Surgical History:  Procedure Laterality Date   KNEE ARTHROSCOPY Bilateral     Family History  Problem Relation Age of Onset   Heart attack Mother    Cancer Father    Heart disease Father    Colon cancer Neg Hx     No Known Allergies  Current Outpatient Medications on File Prior to Visit  Medication Sig Dispense Refill   albuterol (VENTOLIN HFA) 108 (90 Base) MCG/ACT inhaler INHALE 2 PUFFS BY MOUTH EVERY 6 HOURS AS NEEDED FOR WHEEZING OR SHORTNESS OF BREATH  8.5 g 0   amLODipine (NORVASC) 10 MG tablet 1 tab po q day 90 tablet 3   aspirin EC 81 MG tablet Take by mouth.     atorvastatin (LIPITOR) 20 MG tablet TAKE 1 TABLET BY MOUTH EVERY DAY 90 tablet 3   buPROPion (WELLBUTRIN XL) 150 MG 24 hr tablet Take 1 tablet (150 mg total) by mouth daily. 30 tablet 0   cefdinir (OMNICEF) 300 MG capsule Take 1 capsule (300 mg total) by mouth 2 (two) times daily. 20 capsule 0   COVID-19 mRNA bivalent vaccine, Pfizer, (PFIZER COVID-19 VAC BIVALENT) injection Inject into the muscle. 0.3 mL 0   doxycycline (VIBRA-TABS) 100 MG tablet Take 1 tablet (100 mg total) by mouth 2 (two) times daily. 20 tablet 0   doxycycline (VIBRA-TABS) 100 MG tablet Take 1 tablet (100 mg total) by mouth 2 (two) times daily. 8 tablet 0    famciclovir (FAMVIR) 500 MG tablet Take 1 tablet (500 mg total) by mouth 3 (three) times daily. 21 tablet 0   fluticasone (FLONASE) 50 MCG/ACT nasal spray Place 2 sprays into both nostrils daily. 16 g 1   hydrOXYzine (ATARAX/VISTARIL) 10 MG tablet 1 tab po q 8 hrs prn itching 30 tablet 0   insulin aspart (NOVOLOG FLEXPEN) 100 UNIT/ML FlexPen 4 units injection with  each meal tid 15 mL 11   Iron-Vitamin C 65-125 MG TABS Take by mouth.     metFORMIN (GLUCOPHAGE) 500 MG tablet Take 1 tablet (500 mg total) by mouth 2 (two) times daily with a meal. 180 tablet 3   methylPREDNISolone (MEDROL) 4 MG tablet Take 1 tablet (4 mg total) by mouth daily. 4 tablet 0   Multiple Vitamin (MULTI-VITAMINS) TABS Take by mouth.     naproxen sodium (ANAPROX) 220 MG tablet Take 440 mg by mouth 2 (two) times daily as needed.     Semaglutide,0.25 or 0.'5MG'$ /DOS, (OZEMPIC, 0.25 OR 0.5 MG/DOSE,) 2 MG/3ML SOPN Inject 0.'5mg'$  into the skin once weekly 3 mL 11   sildenafil (REVATIO) 20 MG tablet 2-5 tab po 1 hour prior to sex. 50 tablet 0   sildenafil (VIAGRA) 100 MG tablet Take 0.5-1 tablets (50-100 mg total) by mouth daily as needed for erectile dysfunction. 10 tablet 0   tobramycin (TOBREX) 0.3 % ophthalmic solution Place 2 drops into both eyes every 4 (four) hours. 5 mL 0   triamterene-hydrochlorothiazide (DYAZIDE) 37.5-25 MG capsule TAKE 1 CAPSULE BY MOUTH EVERY MORNING 90 capsule 3   UNABLE TO FIND Med Name: Tumeric root oral     No current facility-administered medications on file prior to visit.    BP 130/80   Pulse 87   Temp 98.5 F (36.9 C)   Resp 18   Ht '5\' 7"'$  (1.702 m)   Wt 186 lb (84.4 kg)   SpO2 99%   BMI 29.13 kg/m        Objective:   Physical Exam  General Mental Status- Alert. General Appearance- Not in acute distress.   Skin General: Color- Normal Color. Moisture- Normal Moisture.  Neck Carotid Arteries- Normal color. Moisture- Normal Moisture. No carotid bruits. No JVD.  Chest and Lung  Exam Auscultation: Breath Sounds:-Normal.  Cardiovascular Auscultation:Rythm- Regular. Murmurs & Other Heart Sounds:Auscultation of the heart reveals- No Murmurs.  Abdomen Inspection:-Inspeection Normal. Palpation/Percussion:Note:No mass. Palpation and Percussion of the abdomen reveal- Non Tender, Non Distended + BS, no rebound or guarding.   Neurologic Cranial Nerve exam:- CN III-XII intact(No nystagmus), symmetric smile. Strength:- 5/5 equal and  symmetric strength both upper and lower extremities.   Heent- rt ear canal mild inflammed. Mild redness. No wax seen. No flakiness to canal.  Tm normal. Mild tragal tendness.  Left canal- clear normal tm.      Assessment & Plan:   Patient Instructions  Htn- bp well controlled. Continue amlodipine and dyazide.  Hyperlipidemia- continue atorvastatin 20 mg daily.  Diabetes- a1c well controlled. Continue low sugar diet, exercise and ozempic.   Flu vaccine today.  For otitis externa rx cortisporin otic drops.  For left knee pain placed orthopedic referral.  Will get cmp, lipid panel and a1c.  Follow up  3 months  or sooner if needed.                  Mackie Pai, PA-C

## 2022-09-09 NOTE — Patient Instructions (Addendum)
Htn- bp well controlled. Continue amlodipine and dyazide.  Hyperlipidemia- continue atorvastatin 20 mg daily.  Diabetes- a1c well controlled. Continue low sugar diet, exercise and ozempic.   Flu vaccine today.  For otitis externa rx cortisporin otic drops.  For left knee pain placed orthopedic referral.  Will get cmp, lipid panel and a1c.  Follow up  3 months  or sooner if needed.

## 2022-09-09 NOTE — Addendum Note (Signed)
Addended by: Jeronimo Greaves on: 09/09/2022 09:42 AM   Modules accepted: Orders

## 2022-09-14 ENCOUNTER — Encounter: Payer: Self-pay | Admitting: Medical

## 2022-09-20 ENCOUNTER — Ambulatory Visit (INDEPENDENT_AMBULATORY_CARE_PROVIDER_SITE_OTHER): Payer: Managed Care, Other (non HMO) | Admitting: Orthopaedic Surgery

## 2022-09-20 ENCOUNTER — Ambulatory Visit (INDEPENDENT_AMBULATORY_CARE_PROVIDER_SITE_OTHER): Payer: Managed Care, Other (non HMO)

## 2022-09-20 ENCOUNTER — Ambulatory Visit: Payer: Self-pay

## 2022-09-20 ENCOUNTER — Encounter: Payer: Self-pay | Admitting: Orthopaedic Surgery

## 2022-09-20 DIAGNOSIS — M25562 Pain in left knee: Secondary | ICD-10-CM | POA: Diagnosis not present

## 2022-09-20 DIAGNOSIS — M1711 Unilateral primary osteoarthritis, right knee: Secondary | ICD-10-CM

## 2022-09-20 DIAGNOSIS — M1712 Unilateral primary osteoarthritis, left knee: Secondary | ICD-10-CM

## 2022-09-20 DIAGNOSIS — M25561 Pain in right knee: Secondary | ICD-10-CM | POA: Diagnosis not present
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
## 2022-09-27 ENCOUNTER — Encounter: Payer: Self-pay | Admitting: Medical

## 2022-09-27 MED ORDER — ATORVASTATIN CALCIUM 20 MG PO TABS: ORAL_TABLET | ORAL | 3 refills | Status: DC

## 2022-10-03 ENCOUNTER — Other Ambulatory Visit: Payer: Self-pay | Admitting: Medical

## 2022-10-04 ENCOUNTER — Other Ambulatory Visit (HOSPITAL_BASED_OUTPATIENT_CLINIC_OR_DEPARTMENT_OTHER): Payer: Self-pay

## 2022-10-04 MED ORDER — AMLODIPINE BESYLATE 10 MG PO TABS: ORAL_TABLET | ORAL | 1 refills | Status: DC

## 2022-10-04 NOTE — Addendum Note (Signed)
Addended byDamita Dunnings D on: 10/04/2022 08:16 AM   Modules accepted: Orders

## 2022-10-11 ENCOUNTER — Telehealth: Payer: Self-pay | Admitting: Orthopaedic Surgery

## 2022-10-11 NOTE — Telephone Encounter (Signed)
Left message on patient's voicemail asking for him to return the call to discuss surgery date for left total knee with Dr. Erlinda Hong.

## 2022-11-28 ENCOUNTER — Other Ambulatory Visit: Payer: Self-pay

## 2022-12-08 ENCOUNTER — Other Ambulatory Visit (HOSPITAL_BASED_OUTPATIENT_CLINIC_OR_DEPARTMENT_OTHER): Payer: Self-pay

## 2022-12-09 ENCOUNTER — Other Ambulatory Visit: Payer: Self-pay

## 2022-12-09 ENCOUNTER — Ambulatory Visit (INDEPENDENT_AMBULATORY_CARE_PROVIDER_SITE_OTHER): Payer: Managed Care, Other (non HMO) | Admitting: Medical

## 2022-12-09 ENCOUNTER — Other Ambulatory Visit (HOSPITAL_BASED_OUTPATIENT_CLINIC_OR_DEPARTMENT_OTHER): Payer: Self-pay

## 2022-12-09 VITALS — BP 118/70 | HR 88 | Temp 98.0°F | Resp 18 | Ht 67.0 in | Wt 190.0 lb

## 2022-12-09 DIAGNOSIS — E119 Type 2 diabetes mellitus without complications: Secondary | ICD-10-CM | POA: Diagnosis not present

## 2022-12-09 DIAGNOSIS — I10 Essential (primary) hypertension: Secondary | ICD-10-CM | POA: Diagnosis not present

## 2022-12-09 DIAGNOSIS — R972 Elevated prostate specific antigen [PSA]: Secondary | ICD-10-CM

## 2022-12-09 DIAGNOSIS — E785 Hyperlipidemia, unspecified: Secondary | ICD-10-CM | POA: Diagnosis not present

## 2022-12-09 LAB — COMPREHENSIVE METABOLIC PANEL
ALT: 25 U/L (ref 0–53)
AST: 21 U/L (ref 0–37)
Albumin: 4.4 g/dL (ref 3.5–5.2)
Alkaline Phosphatase: 48 U/L (ref 39–117)
BUN: 18 mg/dL (ref 6–23)
CO2: 26 mEq/L (ref 19–32)
Calcium: 9.6 mg/dL (ref 8.4–10.5)
Chloride: 101 mEq/L (ref 96–112)
Creatinine, Ser: 1.23 mg/dL (ref 0.40–1.50)
GFR: 62.67 mL/min (ref >60.00)
Glucose, Bld: 107 mg/dL — ABNORMAL HIGH (ref 70–99)
Potassium: 3.9 mEq/L (ref 3.5–5.1)
Sodium: 137 mEq/L (ref 135–145)
Total Bilirubin: 0.7 mg/dL (ref 0.2–1.2)
Total Protein: 7.6 g/dL (ref 6.0–8.3)

## 2022-12-09 LAB — PSA: PSA: 1.74 ng/mL (ref 0.10–4.00)

## 2022-12-09 MED ORDER — AMLODIPINE BESYLATE 10 MG PO TABS: ORAL_TABLET | ORAL | 1 refills | Status: DC

## 2022-12-09 MED ORDER — ATORVASTATIN CALCIUM 20 MG PO TABS: ORAL_TABLET | ORAL | 3 refills | Status: DC

## 2022-12-09 NOTE — Addendum Note (Signed)
Addended by: Anabel Halon on: 12/09/2022 08:33 AM   Modules accepted: Orders

## 2022-12-09 NOTE — Progress Notes (Signed)
Subjective:    Patient ID: Craig House, male    DOB: [birth year], 62 y.o.   MRN: [id number]  HPI  Here for follow up.  Diabetes- last A1c was 7.5.Pt is on ozempic  0.5 weekly injection. Pt is also on metformin. Pt states eating healthier and walking some.  Htn- on amlodipine and dyzide. Pt bp readings at home more tightly controlled.   For high cholesterol he is on lipitor 20 mg daily.   On review slight increase psa values. Pt expresses concern since his cousin past away from prostate cancer.   Review of Systems  Constitutional:  Negative for chills, fatigue and fever.  Respiratory:  Negative for cough, chest tightness, shortness of breath and wheezing.   Cardiovascular:  Negative for chest pain and palpitations.  Gastrointestinal:  Negative for abdominal pain.  Genitourinary:  Negative for dysuria, flank pain and frequency.  Musculoskeletal:  Negative for back pain and joint swelling.  Neurological:  Negative for dizziness, speech difficulty, weakness, numbness and headaches.  Hematological:  Negative for adenopathy. Does not bruise/bleed easily.  Psychiatric/Behavioral:  Negative for behavioral problems, confusion and decreased concentration.     Past Medical History:  Diagnosis Date   Allergy    spring   Anemia    Asthma    Hyperplastic colon polyp    Hypertension    Obesity      Social History   Socioeconomic History   Marital status: Single    Spouse name: Not on file   Number of children: Not on file   Years of education: Not on file   Highest education level: Not on file  Occupational History   Not on file  Tobacco Use   Smoking status: Former   Smokeless tobacco: Never  Vaping Use   Vaping Use: Never used  Substance and Sexual Activity   Alcohol use: Yes    Alcohol/week: 1.0 standard drink of alcohol    Types: 1 Standard drinks or equivalent per week    Comment: 1 glass of red wine at night   Drug use: No   Sexual activity: Yes  Other  Topics Concern   Not on file  Social History Narrative   Not on file   Social Determinants of Health   Financial Resource Strain: Not on file  Food Insecurity: Not on file  Transportation Needs: Not on file  Physical Activity: Not on file  Stress: Not on file  Social Connections: Not on file  Intimate Partner Violence: Not on file    Past Surgical History:  Procedure Laterality Date   KNEE ARTHROSCOPY Bilateral     Family History  Problem Relation Age of Onset   Heart attack Mother    Cancer Father    Heart disease Father    Colon cancer Neg Hx     No Known Allergies  Current Outpatient Medications on File Prior to Visit  Medication Sig Dispense Refill   albuterol (VENTOLIN HFA) 108 (90 Base) MCG/ACT inhaler INHALE 2 PUFFS BY MOUTH EVERY 6 HOURS AS NEEDED FOR WHEEZING OR SHORTNESS OF BREATH 8.5 g 0   amLODipine (NORVASC) 10 MG tablet 1 tab po q day 90 tablet 1   aspirin EC 81 MG tablet Take by mouth.     atorvastatin (LIPITOR) 20 MG tablet TAKE 1 TABLET BY MOUTH EVERY DAY 90 tablet 3   buPROPion (WELLBUTRIN XL) 150 MG 24 hr tablet Take 1 tablet (150 mg total) by mouth daily. 30 tablet 0  cefdinir (OMNICEF) 300 MG capsule Take 1 capsule (300 mg total) by mouth 2 (two) times daily. 20 capsule 0   COVID-19 mRNA bivalent vaccine, Pfizer, (PFIZER COVID-19 VAC BIVALENT) injection Inject into the muscle. 0.3 mL 0   doxycycline (VIBRA-TABS) 100 MG tablet Take 1 tablet (100 mg total) by mouth 2 (two) times daily. 20 tablet 0   doxycycline (VIBRA-TABS) 100 MG tablet Take 1 tablet (100 mg total) by mouth 2 (two) times daily. 8 tablet 0   famciclovir (FAMVIR) 500 MG tablet Take 1 tablet (500 mg total) by mouth 3 (three) times daily. 21 tablet 0   fluticasone (FLONASE) 50 MCG/ACT nasal spray Place 2 sprays into both nostrils daily. 16 g 1   hydrOXYzine (ATARAX/VISTARIL) 10 MG tablet 1 tab po q 8 hrs prn itching 30 tablet 0   insulin aspart (NOVOLOG FLEXPEN) 100 UNIT/ML FlexPen 4  units injection with  each meal tid 15 mL 11   Iron-Vitamin C 65-125 MG TABS Take by mouth.     metFORMIN (GLUCOPHAGE) 500 MG tablet Take 1 tablet (500 mg total) by mouth 2 (two) times daily with a meal. 180 tablet 3   methylPREDNISolone (MEDROL) 4 MG tablet Take 1 tablet (4 mg total) by mouth daily. 4 tablet 0   Multiple Vitamin (MULTI-VITAMINS) TABS Take by mouth.     naproxen sodium (ANAPROX) 220 MG tablet Take 440 mg by mouth 2 (two) times daily as needed.     neomycin-polymyxin-hydrocortisone (CORTISPORIN) OTIC solution Place 3 drops into the right ear 4 (four) times daily. 10 mL 0   Semaglutide,0.25 or 0.'5MG'$ /DOS, (OZEMPIC, 0.25 OR 0.5 MG/DOSE,) 2 MG/3ML SOPN Inject 0.'5mg'$  into the skin once weekly 3 mL 11   sildenafil (REVATIO) 20 MG tablet 2-5 tab po 1 hour prior to sex. 50 tablet 0   sildenafil (VIAGRA) 100 MG tablet Take 0.5-1 tablets (50-100 mg total) by mouth daily as needed for erectile dysfunction. 10 tablet 0   triamterene-hydrochlorothiazide (DYAZIDE) 37.5-25 MG capsule TAKE 1 CAPSULE BY MOUTH EVERY MORNING 90 capsule 3   UNABLE TO FIND Med Name: Tumeric root oral     No current facility-administered medications on file prior to visit.    BP 118/70   Pulse 88   Temp 98 F (36.7 C)   Resp 18   Ht '5\' 7"'$  (1.702 m)   Wt 190 lb (86.2 kg)   SpO2 98%   BMI 29.76 kg/m        Objective:   Physical Exam  General Mental Status- Alert. General Appearance- Not in acute distress.   Skin General: Color- Normal Color. Moisture- Normal Moisture.  Neck Carotid Arteries- Normal color. Moisture- Normal Moisture. No carotid bruits. No JVD.  Chest and Lung Exam Auscultation: Breath Sounds:-Normal.  Cardiovascular Auscultation:Rythm- Regular. Murmurs & Other Heart Sounds:Auscultation of the heart reveals- No Murmurs.  Abdomen Inspection:-Inspeection Normal. Palpation/Percussion:Note:No mass. Palpation and Percussion of the abdomen reveal- Non Tender, Non Distended + BS, no  rebound or guarding.   Neurologic Cranial Nerve exam:- CN III-XII intact(No nystagmus), symmetric smile. Strength:- 5/5 equal and symmetric strength both upper and lower extremities.       Assessment & Plan:   Patient Instructions  Diabetes- check A1c and cmp today. If A1c still up consider increase ozempic dose. Continue metformin.  High cholesterol. Continue atorvastatin.  Htn- bp well controlled on current regmine amlodipine and dyazide.   Follow up in 3 months or sooner if needed.    Mackie Pai PA-C

## 2022-12-09 NOTE — Patient Instructions (Addendum)
Diabetes- check A1c and cmp today. If A1c still up consider increase ozempic dose. Continue metformin.  High cholesterol. Continue atorvastatin.  Htn- bp well controlled on current regemin amlodipine and dyazide.   Follow up in 3 months or sooner if needed.

## 2022-12-12 LAB — HEMOGLOBIN A1C: Hgb A1c MFr Bld: 5.4 % (ref 4.6–6.5)

## 2022-12-12 MED ORDER — SEMAGLUTIDE (1 MG/DOSE) 4 MG/3ML ~~LOC~~ SOPN: 1.0000 mg | PEN_INJECTOR | SUBCUTANEOUS | 1 refills | Status: DC

## 2022-12-12 NOTE — Addendum Note (Signed)
Addended by: Anabel Halon on: 12/12/2022 10:57 AM   Modules accepted: Orders

## 2023-01-02 ENCOUNTER — Other Ambulatory Visit: Payer: Self-pay | Admitting: Physician Assistant

## 2023-01-02 MED ORDER — CEFADROXIL 500 MG PO CAPS: 500.0000 mg | ORAL_CAPSULE | Freq: Two times a day (BID) | ORAL | 0 refills | Status: DC

## 2023-01-02 MED ORDER — ONDANSETRON HCL 4 MG PO TABS: 4.0000 mg | ORAL_TABLET | Freq: Three times a day (TID) | ORAL | 0 refills | Status: DC | PRN

## 2023-01-02 MED ORDER — OXYCODONE-ACETAMINOPHEN 5-325 MG PO TABS: 1.0000 | ORAL_TABLET | Freq: Four times a day (QID) | ORAL | 0 refills | Status: DC | PRN

## 2023-01-02 MED ORDER — DOCUSATE SODIUM 100 MG PO CAPS: 100.0000 mg | ORAL_CAPSULE | Freq: Every day | ORAL | 2 refills | Status: AC | PRN

## 2023-01-02 MED ORDER — ASPIRIN 81 MG PO TBEC: 81.0000 mg | DELAYED_RELEASE_TABLET | Freq: Two times a day (BID) | ORAL | 0 refills | Status: AC

## 2023-01-02 MED ORDER — METHOCARBAMOL 750 MG PO TABS: 750.0000 mg | ORAL_TABLET | Freq: Two times a day (BID) | ORAL | 2 refills | Status: DC | PRN

## 2023-01-03 NOTE — Progress Notes (Signed)
Surgical Instructions    Your procedure is scheduled on Monday, January 09, 2023.  Report to [facility] Main Entrance "A" at 5:30 A.M., then check in with the Admitting office.  Call this number if you have problems the morning of surgery:  [phone]   If you have any questions prior to your surgery date call [phone]: Open Monday-Friday 8am-4pm If you experience any cold or flu symptoms such as cough, fever, chills, shortness of breath, etc. between now and your scheduled surgery, please notify us at the above number     Remember:  Do not eat after midnight the night before your surgery  You may drink clear liquids until 4:30 the morning of your surgery.   Clear liquids allowed are: Water, Non-Citrus Juices (without pulp), Carbonated Beverages, Clear Tea, Black Coffee ONLY (NO MILK, CREAM OR POWDERED CREAMER of any kind), and Gatorade   Enhanced Recovery after Surgery for Orthopedics Enhanced Recovery after Surgery is a protocol used to improve the stress on your body and your recovery after surgery.  Patient Instructions   The day of surgery (if you have diabetes):  Drink ONE small 10 oz bottle of water by 4:30am the morning of surgery This bottle was given to you during your hospital  pre-op appointment visit.  Nothing else to drink after completing the  Small 10 oz bottle of water.         If you have questions, please contact your surgeon's office.     Take these medicines the morning of surgery with A SIP OF WATER:  amLODipine (NORVASC)  atorvastatin (LIPITOR)   If needed:  albuterol (VENTOLIN HFA) Please bring with you the day of surgery. Brimonidine Tartrate (LUMIFY) eye drops ondansetron (ZOFRAN)   Stop taking Semaglutide injection 7 days prior to surgery. Last dose on 01/02/2023  Follow your surgeon's instructions on when to stop Aspirin.  If no instructions were given by your surgeon then you will need to call the office to get those instructions.     As of today, STOP taking any (unless otherwise instructed by your surgeon) Aleve, Naproxen, Ibuprofen, Motrin, Advil, Goody's, BC's, all herbal medications, fish oil, and all vitamins.  WHAT DO I DO ABOUT MY DIABETES MEDICATION?   Do not take metFORMIN (GLUCOPHAGE)  the morning of surgery.   The day of surgery, do not take other diabetes injectables, including Byetta (exenatide), Bydureon (exenatide ER), Victoza (liraglutide), or Trulicity (dulaglutide).    HOW TO MANAGE YOUR DIABETES BEFORE AND AFTER SURGERY  Why is it important to control my blood sugar before and after surgery? Improving blood sugar levels before and after surgery helps healing and can limit problems. A way of improving blood sugar control is eating a healthy diet by:  Eating less sugar and carbohydrates  Increasing activity/exercise  Talking with your doctor about reaching your blood sugar goals High blood sugars (greater than 180 mg/dL) can raise your risk of infections and slow your recovery, so you will need to focus on controlling your diabetes during the weeks before surgery. Make sure that the doctor who takes care of your diabetes knows about your planned surgery including the date and location.  How do I manage my blood sugar before surgery? Check your blood sugar at least 4 times a day, starting 2 days before surgery, to make sure that the level is not too high or low.  Check your blood sugar the morning of your surgery when you wake up and every 2 hours until  you get to the Short Stay unit.  If your blood sugar is less than 70 mg/dL, you will need to treat for low blood sugar: Do not take insulin. Treat a low blood sugar (less than 70 mg/dL) with  cup of clear juice (cranberry or apple), 4 glucose tablets, OR glucose gel. Recheck blood sugar in 15 minutes after treatment (to make sure it is greater than 70 mg/dL). If your blood sugar is not greater than 70 mg/dL on recheck, call [phone] for  further instructions. Report your blood sugar to the short stay nurse when you get to Short Stay.  If you are admitted to the hospital after surgery: Your blood sugar will be checked by the staff and you will probably be given insulin after surgery (instead of oral diabetes medicines) to make sure you have good blood sugar levels. The goal for blood sugar control after surgery is 80-180 mg/dL.     SURGICAL WAITING ROOM VISITATION Patients having surgery or a procedure may have no more than 2 support people in the waiting area - these visitors may rotate.   Children under the age of 60 must have an adult with them who is not the patient. If the patient needs to stay at the hospital during part of their recovery, the visitor guidelines for inpatient rooms apply. Pre-op nurse will coordinate an appropriate time for 1 support person to accompany patient in pre-op.  This support person may not rotate.   Please refer to [email or web address] for the visitor guidelines for Inpatients (after your surgery is over and you are in a regular room).    Special instructions:    Oral Hygiene is also important to reduce your risk of infection.  Remember - BRUSH YOUR TEETH THE MORNING OF SURGERY WITH YOUR REGULAR TOOTHPASTE   [facility]- Preparing For Surgery  Before surgery, you can play an important role. Because skin is not sterile, your skin needs to be as free of germs as possible. You can reduce the number of germs on your skin by washing with CHG (chlorahexidine gluconate) Soap before surgery.  CHG is an antiseptic cleaner which kills germs and bonds with the skin to continue killing germs even after washing.     Please do not use if you have an allergy to CHG or antibacterial soaps. If your skin becomes reddened/irritated stop using the CHG.  Do not shave (including legs and underarms) for at least 48 hours prior to first CHG shower. It is OK to  shave your face.  Please follow these instructions carefully.     Shower the NIGHT BEFORE SURGERY and the MORNING OF SURGERY with CHG Soap.   If you chose to wash your hair, wash your hair first as usual with your normal shampoo. After you shampoo, rinse your hair and body thoroughly to remove the shampoo.  Then [employer] and genitals (private parts) with your normal soap and rinse thoroughly to remove soap.  After that Use CHG Soap as you would any other liquid soap. You can apply CHG directly to the skin and wash gently with a scrungie or a clean washcloth.   Apply the CHG Soap to your body ONLY FROM THE NECK DOWN.  Do not use on open wounds or open sores. Avoid contact with your eyes, ears, mouth and genitals (private parts). Wash Face and genitals (private parts)  with your normal soap.   Wash thoroughly, paying special attention to the area where your surgery will be performed.  Thoroughly rinse your body with warm water from the neck down.  DO NOT shower/wash with your normal soap after using and rinsing off the CHG Soap.  Pat yourself dry with a CLEAN TOWEL.  Wear CLEAN PAJAMAS to bed the night before surgery  Place CLEAN SHEETS on your bed the night before your surgery  DO NOT SLEEP WITH PETS.   Day of Surgery:  Take a shower with CHG soap. Wear Clean/Comfortable clothing the morning of surgery Do not apply any deodorants/lotions.   Remember to brush your teeth WITH YOUR REGULAR TOOTHPASTE.  Do not wear jewelry or makeup. Do not wear lotions, powders, perfumes/cologne or deodorant. Do not shave 48 hours prior to surgery.  Men may shave face and neck. Do not bring valuables to the hospital. Do not wear nail polish, gel polish, artificial nails, or any other type of covering on natural nails (fingers and toes) If you have artificial nails or gel coating that need to be removed by a nail salon, please have this removed prior to surgery. Artificial nails or gel coating may  interfere with anesthesia's ability to adequately monitor your vital signs.  [facility] is not responsible for any belongings or valuables.    Do NOT Smoke (Tobacco/Vaping)  24 hours prior to your procedure  If you use a CPAP at night, you may bring your mask for your overnight stay.   Contacts, glasses, hearing aids, dentures or partials may not be worn into surgery, please bring cases for these belongings   For patients admitted to the hospital, discharge time will be determined by your treatment team.   Patients discharged the day of surgery will not be allowed to drive home, and someone needs to stay with them for 24 hours.   If you received a COVID test during your pre-op visit, it is requested that you wear a mask when out in public, stay away from anyone that may not be feeling well, and notify your surgeon if you develop symptoms. If you have been in contact with anyone that has tested positive in the last 10 days, please notify your surgeon.    Please read over the following fact sheets that you were given.

## 2023-01-04 ENCOUNTER — Encounter (HOSPITAL_COMMUNITY): Payer: Self-pay

## 2023-01-04 ENCOUNTER — Telehealth: Payer: Self-pay | Admitting: Orthopaedic Surgery

## 2023-01-04 ENCOUNTER — Other Ambulatory Visit: Payer: Self-pay

## 2023-01-04 ENCOUNTER — Encounter (HOSPITAL_COMMUNITY)
Admission: RE | Admit: 2023-01-04 | Discharge: 2023-01-04 | Disposition: A | Discharge status: Home / Self Care | Payer: Managed Care, Other (non HMO) | Source: Ambulatory Visit | Attending: Orthopaedic Surgery | Admitting: Orthopaedic Surgery

## 2023-01-04 VITALS — BP 135/86 | HR 85 | Temp 98.3°F | Resp 18 | Ht 67.0 in | Wt 190.4 lb

## 2023-01-04 DIAGNOSIS — E119 Type 2 diabetes mellitus without complications: Secondary | ICD-10-CM | POA: Diagnosis not present

## 2023-01-04 DIAGNOSIS — M1712 Unilateral primary osteoarthritis, left knee: Secondary | ICD-10-CM | POA: Diagnosis not present

## 2023-01-04 DIAGNOSIS — Z01818 Encounter for other preprocedural examination: Secondary | ICD-10-CM | POA: Insufficient documentation

## 2023-01-04 HISTORY — DX: Type 2 diabetes mellitus without complications: E11.9

## 2023-01-04 LAB — CBC
HCT: 39.6 % (ref 39.0–52.0)
Hemoglobin: 13.9 g/dL (ref 13.0–17.0)
MCH: 33 pg (ref 26.0–34.0)
MCHC: 35.1 g/dL (ref 30.0–36.0)
MCV: 94.1 fL (ref 80.0–100.0)
Platelets: 324 10*3/uL (ref 150–400)
RBC: 4.21 MIL/uL — ABNORMAL LOW (ref 4.22–5.81)
RDW: 12.1 % (ref 11.5–15.5)
WBC: 7.2 10*3/uL (ref 4.0–10.5)
nRBC: 0 % (ref 0.0–0.2)

## 2023-01-04 LAB — GLUCOSE, CAPILLARY: Glucose-Capillary: 98 mg/dL (ref 70–99)

## 2023-01-04 LAB — BASIC METABOLIC PANEL
Anion gap: 5 (ref 5–15)
BUN: 23 mg/dL (ref 8–23)
CO2: 30 mmol/L (ref 22–32)
Calcium: 9.1 mg/dL (ref 8.9–10.3)
Chloride: 102 mmol/L (ref 98–111)
Creatinine, Ser: 1.7 mg/dL — ABNORMAL HIGH (ref 0.61–1.24)
GFR, Estimated: 45 mL/min — ABNORMAL LOW (ref >60)
Glucose, Bld: 96 mg/dL (ref 70–99)
Potassium: 4 mmol/L (ref 3.5–5.1)
Sodium: 137 mmol/L (ref 135–145)

## 2023-01-04 LAB — SURGICAL PCR SCREEN
MRSA, PCR: NEGATIVE
Staphylococcus aureus: NEGATIVE

## 2023-01-04 NOTE — Progress Notes (Addendum)
PCP - Mackie Pai, MD Cardiologist - Denies  PPM/ICD - Denies  Chest x-ray - Denies EKG - 01/04/2023 Stress Test - 03/16/2016 ECHO - Denies Cardiac Cath - Denies  Sleep Study - Denies  DM: Type II Fasting Blood Sugar - 130-140 Checks Blood Sugar twice a day  Last dose of GLP1 agonist-  02/ 15/2024 GLP1 instructions: Hold Semaglutide 7 days prior to surgery. Patient takes on Thursdays. Last dose on 12/29/2022  Blood Thinner Instructions: N/A Aspirin Instructions: Patient instructed to contact surgeons office regarding when to stop taking aspirin.  ERAS Protcol - yes PRE-SURGERY Ensure or G2- G2  COVID TEST- N/A   Anesthesia review: No  Patient denies shortness of breath, fever, cough and chest pain at PAT appointment   All instructions explained to the patient, with a verbal understanding of the material. Patient agrees to go over the instructions while at home for a better understanding. The opportunity to ask questions was provided.

## 2023-01-04 NOTE — Telephone Encounter (Signed)
Patient is scheduled for left total knee on 01-09-23.  He got a call from [employer] stating insurance  [facility]) will not pay for the CPM machine for the knee.  They describe it as "experimental".   Patient would like to know if there is any other alternative to the CPM machine.  Self Pay Rate or Cost out of pocket for him would be as follows:     Week 1 $200 Week 2 $200 Week 3+ $100/ea    Please call patient to let him know if there is something else he can do or if you feel he would be able to manage without it.   [phone]

## 2023-01-05 ENCOUNTER — Other Ambulatory Visit (HOSPITAL_BASED_OUTPATIENT_CLINIC_OR_DEPARTMENT_OTHER): Payer: Self-pay

## 2023-01-05 ENCOUNTER — Other Ambulatory Visit: Payer: Self-pay

## 2023-01-05 DIAGNOSIS — M1712 Unilateral primary osteoarthritis, left knee: Secondary | ICD-10-CM
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
## 2023-01-05 NOTE — Telephone Encounter (Signed)
Spoke with patient. He is scheduled for Monday for a total knee. Due to [facility] is not an option. He has ordered the CPM machine. I ordered outpatient PT to be done here. Anything else I need to do?

## 2023-01-05 NOTE — Telephone Encounter (Signed)
Not that I can think of.  Thanks so much.

## 2023-01-05 NOTE — Telephone Encounter (Signed)
We can just not do CPM

## 2023-01-06 MED ORDER — TRANEXAMIC ACID 1000 MG/10ML IV SOLN
2000.0000 mg | INTRAVENOUS | Status: DC
  Filled 2023-01-06: qty 20

## 2023-01-08 NOTE — Anesthesia Preprocedure Evaluation (Signed)
Anesthesia Evaluation  Patient identified by MRN, date of birth, ID band Patient awake    Reviewed: Allergy & Precautions, NPO status , Patient's Chart, lab work & pertinent test results  Airway Mallampati: III  TM Distance: >3 FB Neck ROM: Full    Dental  (+) Upper Dentures, Lower Dentures   Pulmonary asthma , former smoker   Pulmonary exam normal        Cardiovascular hypertension, Pt. on medications Normal cardiovascular exam     Neuro/Psych negative neurological ROS  negative psych ROS   GI/Hepatic negative GI ROS, Neg liver ROS,,,  Endo/Other  diabetes, Insulin Dependent, Oral Hypoglycemic Agents  Patient on GLP-1 Agonist. Last dose on 2/15  Renal/GU Renal InsufficiencyRenal disease     Musculoskeletal  (+) Arthritis ,    Abdominal   Peds  Hematology negative hematology ROS (+) Platelets: 324   Anesthesia Other Findings LEFT KNEE OSTEOARTHRITIS  Reproductive/Obstetrics                             Anesthesia Physical Anesthesia Plan  ASA: 3  Anesthesia Plan: Spinal and Regional   Post-op Pain Management: Regional block*   Induction:   PONV Risk Score and Plan: 1 and Ondansetron, Dexamethasone, Midazolam and Treatment may vary due to age or medical condition  Airway Management Planned: Simple Face Mask  Additional Equipment:   Intra-op Plan:   Post-operative Plan:   Informed Consent: I have reviewed the patients History and Physical, chart, labs and discussed the procedure including the risks, benefits and alternatives for the proposed anesthesia with the patient or authorized representative who has indicated his/her understanding and acceptance.     Dental advisory given  Plan Discussed with: CRNA  Anesthesia Plan Comments:        Anesthesia Quick Evaluation

## 2023-01-09 ENCOUNTER — Other Ambulatory Visit: Payer: Self-pay

## 2023-01-09 ENCOUNTER — Encounter (HOSPITAL_COMMUNITY)
Admission: RE | Disposition: A | Discharge status: Home Health | Payer: Self-pay | Source: Home / Self Care | Attending: Orthopaedic Surgery

## 2023-01-09 ENCOUNTER — Ambulatory Visit (HOSPITAL_BASED_OUTPATIENT_CLINIC_OR_DEPARTMENT_OTHER): Payer: Managed Care, Other (non HMO) | Admitting: Anesthesiology

## 2023-01-09 ENCOUNTER — Observation Stay (HOSPITAL_COMMUNITY)
Admission: RE | Admit: 2023-01-09 | Discharge: 2023-01-10 | Disposition: A | Discharge status: Home Health | Payer: Managed Care, Other (non HMO) | Attending: Orthopaedic Surgery | Admitting: Orthopaedic Surgery

## 2023-01-09 ENCOUNTER — Encounter (HOSPITAL_COMMUNITY): Payer: Self-pay | Admitting: Orthopaedic Surgery

## 2023-01-09 ENCOUNTER — Ambulatory Visit (HOSPITAL_COMMUNITY): Payer: Managed Care, Other (non HMO) | Admitting: Anesthesiology

## 2023-01-09 ENCOUNTER — Observation Stay (HOSPITAL_COMMUNITY): Payer: Managed Care, Other (non HMO)

## 2023-01-09 DIAGNOSIS — J45909 Unspecified asthma, uncomplicated: Secondary | ICD-10-CM | POA: Diagnosis not present

## 2023-01-09 DIAGNOSIS — Z7984 Long term (current) use of oral hypoglycemic drugs: Secondary | ICD-10-CM | POA: Insufficient documentation

## 2023-01-09 DIAGNOSIS — Z794 Long term (current) use of insulin: Secondary | ICD-10-CM | POA: Diagnosis not present

## 2023-01-09 DIAGNOSIS — E119 Type 2 diabetes mellitus without complications: Secondary | ICD-10-CM | POA: Insufficient documentation

## 2023-01-09 DIAGNOSIS — Z87891 Personal history of nicotine dependence: Secondary | ICD-10-CM | POA: Insufficient documentation

## 2023-01-09 DIAGNOSIS — Z79899 Other long term (current) drug therapy: Secondary | ICD-10-CM | POA: Diagnosis not present

## 2023-01-09 DIAGNOSIS — I10 Essential (primary) hypertension: Secondary | ICD-10-CM | POA: Diagnosis not present

## 2023-01-09 DIAGNOSIS — Z01818 Encounter for other preprocedural examination: Secondary | ICD-10-CM

## 2023-01-09 DIAGNOSIS — Z96652 Presence of left artificial knee joint: Secondary | ICD-10-CM

## 2023-01-09 DIAGNOSIS — M1712 Unilateral primary osteoarthritis, left knee: Principal | ICD-10-CM | POA: Insufficient documentation

## 2023-01-09 DIAGNOSIS — Z7982 Long term (current) use of aspirin: Secondary | ICD-10-CM | POA: Diagnosis not present

## 2023-01-09 DIAGNOSIS — M21162 Varus deformity, not elsewhere classified, left knee: Secondary | ICD-10-CM

## 2023-01-09 HISTORY — PX: TOTAL KNEE ARTHROPLASTY: SHX125

## 2023-01-09 LAB — GLUCOSE, CAPILLARY
Glucose-Capillary: 123 mg/dL — ABNORMAL HIGH (ref 70–99)
Glucose-Capillary: 149 mg/dL — ABNORMAL HIGH (ref 70–99)
Glucose-Capillary: 144 mg/dL — ABNORMAL HIGH (ref 70–99)
Glucose-Capillary: 202 mg/dL — ABNORMAL HIGH (ref 70–99)

## 2023-01-09 SURGERY — ARTHROPLASTY, KNEE, TOTAL
Anesthesia: Regional | Site: Knee | Laterality: Left

## 2023-01-09 MED ORDER — FENTANYL CITRATE (PF) 250 MCG/5ML IJ SOLN
INTRAMUSCULAR | Status: DC | PRN
  Administered 2023-01-09: 50 ug via INTRAVENOUS

## 2023-01-09 MED ORDER — VANCOMYCIN HCL 1000 MG IV SOLR
INTRAVENOUS | Status: DC | PRN
  Administered 2023-01-09: 1000 mg via TOPICAL

## 2023-01-09 MED ORDER — TRANEXAMIC ACID-NACL 1000-0.7 MG/100ML-% IV SOLN
1000.0000 mg | Freq: Once | INTRAVENOUS | Status: AC
  Administered 2023-01-09: 1000 mg via INTRAVENOUS
  Filled 2023-01-09: qty 100

## 2023-01-09 MED ORDER — TRANEXAMIC ACID-NACL 1000-0.7 MG/100ML-% IV SOLN
1000.0000 mg | INTRAVENOUS | Status: AC
  Administered 2023-01-09: 1000 mg via INTRAVENOUS
  Filled 2023-01-09: qty 100

## 2023-01-09 MED ORDER — TRANEXAMIC ACID 1000 MG/10ML IV SOLN
INTRAVENOUS | Status: DC | PRN
  Administered 2023-01-09: 2000 mg via TOPICAL

## 2023-01-09 MED ORDER — INSULIN ASPART 100 UNIT/ML IJ SOLN
0.0000 [IU] | Freq: Every day | INTRAMUSCULAR | Status: DC
  Administered 2023-01-10: 2 [IU] via SUBCUTANEOUS

## 2023-01-09 MED ORDER — LACTATED RINGERS IV SOLN: INTRAVENOUS | Status: DC

## 2023-01-09 MED ORDER — OXYCODONE HCL 5 MG PO TABS
10.0000 mg | ORAL_TABLET | ORAL | Status: DC | PRN
  Administered 2023-01-09 – 2023-01-10 (×2): 15 mg via ORAL
  Filled 2023-01-09 (×2): qty 3

## 2023-01-09 MED ORDER — ACETAMINOPHEN 325 MG PO TABS: 325.0000 mg | ORAL_TABLET | Freq: Four times a day (QID) | ORAL | Status: DC | PRN

## 2023-01-09 MED ORDER — ONDANSETRON HCL 4 MG/2ML IJ SOLN
INTRAMUSCULAR | Status: AC
  Filled 2023-01-09: qty 2

## 2023-01-09 MED ORDER — BUPIVACAINE-EPINEPHRINE (PF) 0.5% -1:200000 IJ SOLN
INTRAMUSCULAR | Status: DC | PRN
  Administered 2023-01-09: 30 mL via PERINEURAL

## 2023-01-09 MED ORDER — INSULIN ASPART 100 UNIT/ML IJ SOLN
0.0000 [IU] | Freq: Three times a day (TID) | INTRAMUSCULAR | Status: DC
  Administered 2023-01-09: 2 [IU] via SUBCUTANEOUS
  Administered 2023-01-10 (×2): 3 [IU] via SUBCUTANEOUS

## 2023-01-09 MED ORDER — HYDROMORPHONE HCL 1 MG/ML IJ SOLN
0.5000 mg | INTRAMUSCULAR | Status: DC | PRN
  Administered 2023-01-09 – 2023-01-10 (×5): 1 mg via INTRAVENOUS
  Filled 2023-01-09 (×5): qty 1

## 2023-01-09 MED ORDER — METOCLOPRAMIDE HCL 5 MG PO TABS: 5.0000 mg | ORAL_TABLET | Freq: Three times a day (TID) | ORAL | Status: DC | PRN

## 2023-01-09 MED ORDER — VANCOMYCIN HCL 1000 MG IV SOLR
INTRAVENOUS | Status: AC
  Filled 2023-01-09: qty 20

## 2023-01-09 MED ORDER — ORAL CARE MOUTH RINSE: 15.0000 mL | Freq: Once | OROMUCOSAL | Status: AC

## 2023-01-09 MED ORDER — SODIUM CHLORIDE 0.9 % IR SOLN
Status: DC | PRN
  Administered 2023-01-09: 1000 mL

## 2023-01-09 MED ORDER — PROMETHAZINE HCL 25 MG/ML IJ SOLN: 6.2500 mg | INTRAMUSCULAR | Status: DC | PRN

## 2023-01-09 MED ORDER — DEXAMETHASONE SODIUM PHOSPHATE 10 MG/ML IJ SOLN
10.0000 mg | Freq: Once | INTRAMUSCULAR | Status: AC
  Administered 2023-01-10: 10 mg via INTRAVENOUS
  Filled 2023-01-09: qty 1

## 2023-01-09 MED ORDER — ONDANSETRON HCL 4 MG/2ML IJ SOLN
4.0000 mg | Freq: Four times a day (QID) | INTRAMUSCULAR | Status: DC | PRN
  Administered 2023-01-09 – 2023-01-10 (×2): 4 mg via INTRAVENOUS
  Filled 2023-01-09 (×2): qty 2

## 2023-01-09 MED ORDER — ASPIRIN 81 MG PO CHEW
81.0000 mg | CHEWABLE_TABLET | Freq: Two times a day (BID) | ORAL | Status: DC
  Administered 2023-01-10 (×2): 81 mg via ORAL
  Filled 2023-01-09 (×2): qty 1

## 2023-01-09 MED ORDER — METFORMIN HCL 500 MG PO TABS
500.0000 mg | ORAL_TABLET | Freq: Two times a day (BID) | ORAL | Status: DC
  Administered 2023-01-09 – 2023-01-10 (×2): 500 mg via ORAL
  Filled 2023-01-09 (×2): qty 1

## 2023-01-09 MED ORDER — OXYCODONE HCL ER 10 MG PO T12A
10.0000 mg | EXTENDED_RELEASE_TABLET | Freq: Two times a day (BID) | ORAL | Status: DC
  Administered 2023-01-09 – 2023-01-10 (×3): 10 mg via ORAL
  Filled 2023-01-09 (×3): qty 1

## 2023-01-09 MED ORDER — MIDAZOLAM HCL 2 MG/2ML IJ SOLN
INTRAMUSCULAR | Status: DC | PRN
  Administered 2023-01-09 (×2): 1 mg via INTRAVENOUS

## 2023-01-09 MED ORDER — OXYCODONE HCL 5 MG PO TABS: 5.0000 mg | ORAL_TABLET | Freq: Once | ORAL | Status: DC | PRN

## 2023-01-09 MED ORDER — PROPOFOL 10 MG/ML IV BOLUS
INTRAVENOUS | Status: DC | PRN
  Administered 2023-01-09: 20 mg via INTRAVENOUS

## 2023-01-09 MED ORDER — INSULIN ASPART 100 UNIT/ML IJ SOLN
4.0000 [IU] | Freq: Three times a day (TID) | INTRAMUSCULAR | Status: DC
  Administered 2023-01-09 – 2023-01-10 (×2): 4 [IU] via SUBCUTANEOUS
  Filled 2023-01-09: qty 0.04

## 2023-01-09 MED ORDER — MIDAZOLAM HCL 2 MG/2ML IJ SOLN
INTRAMUSCULAR | Status: AC
  Filled 2023-01-09: qty 2

## 2023-01-09 MED ORDER — PHENYLEPHRINE 80 MCG/ML (10ML) SYRINGE FOR IV PUSH (FOR BLOOD PRESSURE SUPPORT)
PREFILLED_SYRINGE | INTRAVENOUS | Status: AC
  Filled 2023-01-09: qty 10

## 2023-01-09 MED ORDER — SODIUM CHLORIDE 0.9 % IV SOLN: INTRAVENOUS | Status: DC | PRN

## 2023-01-09 MED ORDER — 0.9 % SODIUM CHLORIDE (POUR BTL) OPTIME
TOPICAL | Status: DC | PRN
  Administered 2023-01-09: 1000 mL

## 2023-01-09 MED ORDER — CEFAZOLIN SODIUM-DEXTROSE 2-4 GM/100ML-% IV SOLN
2.0000 g | Freq: Four times a day (QID) | INTRAVENOUS | Status: AC
  Administered 2023-01-09 (×2): 2 g via INTRAVENOUS
  Filled 2023-01-09 (×2): qty 100

## 2023-01-09 MED ORDER — PHENYLEPHRINE 80 MCG/ML (10ML) SYRINGE FOR IV PUSH (FOR BLOOD PRESSURE SUPPORT)
PREFILLED_SYRINGE | INTRAVENOUS | Status: DC | PRN
  Administered 2023-01-09: 160 ug via INTRAVENOUS
  Administered 2023-01-09 (×2): 240 ug via INTRAVENOUS
  Administered 2023-01-09 (×2): 80 ug via INTRAVENOUS

## 2023-01-09 MED ORDER — BUPIVACAINE-MELOXICAM ER 400-12 MG/14ML IJ SOLN
INTRAMUSCULAR | Status: DC | PRN
  Administered 2023-01-09: 400 mg

## 2023-01-09 MED ORDER — DOCUSATE SODIUM 100 MG PO CAPS
100.0000 mg | ORAL_CAPSULE | Freq: Two times a day (BID) | ORAL | Status: DC
  Administered 2023-01-09 – 2023-01-10 (×3): 100 mg via ORAL
  Filled 2023-01-09 (×3): qty 1

## 2023-01-09 MED ORDER — ONDANSETRON HCL 4 MG PO TABS: 4.0000 mg | ORAL_TABLET | Freq: Four times a day (QID) | ORAL | Status: DC | PRN

## 2023-01-09 MED ORDER — ONDANSETRON HCL 4 MG/2ML IJ SOLN
INTRAMUSCULAR | Status: DC | PRN
  Administered 2023-01-09: 4 mg via INTRAVENOUS

## 2023-01-09 MED ORDER — FERROUS SULFATE 325 (65 FE) MG PO TABS
325.0000 mg | ORAL_TABLET | Freq: Three times a day (TID) | ORAL | Status: DC
  Administered 2023-01-09 – 2023-01-10 (×3): 325 mg via ORAL
  Filled 2023-01-09 (×3): qty 1

## 2023-01-09 MED ORDER — MENTHOL 3 MG MT LOZG: 1.0000 | LOZENGE | OROMUCOSAL | Status: DC | PRN

## 2023-01-09 MED ORDER — METOCLOPRAMIDE HCL 5 MG/ML IJ SOLN: 5.0000 mg | Freq: Three times a day (TID) | INTRAMUSCULAR | Status: DC | PRN

## 2023-01-09 MED ORDER — OXYCODONE HCL 5 MG PO TABS: 5.0000 mg | ORAL_TABLET | ORAL | Status: DC | PRN

## 2023-01-09 MED ORDER — PHENOL 1.4 % MT LIQD: 1.0000 | OROMUCOSAL | Status: DC | PRN

## 2023-01-09 MED ORDER — CEFADROXIL 500 MG PO CAPS
500.0000 mg | ORAL_CAPSULE | Freq: Two times a day (BID) | ORAL | Status: DC
  Administered 2023-01-10: 500 mg via ORAL
  Filled 2023-01-09: qty 1

## 2023-01-09 MED ORDER — TRIAMTERENE-HCTZ 37.5-25 MG PO TABS
1.0000 | ORAL_TABLET | Freq: Every morning | ORAL | Status: DC
  Administered 2023-01-09 – 2023-01-10 (×2): 1 via ORAL
  Filled 2023-01-09 (×3): qty 1

## 2023-01-09 MED ORDER — ACETAMINOPHEN 500 MG PO TABS
1000.0000 mg | ORAL_TABLET | Freq: Four times a day (QID) | ORAL | Status: AC
  Administered 2023-01-09 – 2023-01-10 (×4): 1000 mg via ORAL
  Filled 2023-01-09 (×4): qty 2

## 2023-01-09 MED ORDER — OXYCODONE HCL 5 MG/5ML PO SOLN: 5.0000 mg | Freq: Once | ORAL | Status: DC | PRN

## 2023-01-09 MED ORDER — METHOCARBAMOL 1000 MG/10ML IJ SOLN: 500.0000 mg | Freq: Four times a day (QID) | INTRAVENOUS | Status: DC | PRN

## 2023-01-09 MED ORDER — INSULIN ASPART 100 UNIT/ML IJ SOLN: 0.0000 [IU] | INTRAMUSCULAR | Status: DC | PRN

## 2023-01-09 MED ORDER — PROPOFOL 500 MG/50ML IV EMUL
INTRAVENOUS | Status: DC | PRN
  Administered 2023-01-09: 100 ug/kg/min via INTRAVENOUS

## 2023-01-09 MED ORDER — AMISULPRIDE (ANTIEMETIC) 5 MG/2ML IV SOLN: 10.0000 mg | Freq: Once | INTRAVENOUS | Status: DC | PRN

## 2023-01-09 MED ORDER — POVIDONE-IODINE 10 % EX SWAB
2.0000 | Freq: Once | CUTANEOUS | Status: AC
  Administered 2023-01-09: 2 via TOPICAL

## 2023-01-09 MED ORDER — CEFAZOLIN SODIUM-DEXTROSE 2-4 GM/100ML-% IV SOLN
2.0000 g | INTRAVENOUS | Status: AC
  Administered 2023-01-09: 2 g via INTRAVENOUS
  Filled 2023-01-09: qty 100

## 2023-01-09 MED ORDER — ACETAMINOPHEN 500 MG PO TABS
1000.0000 mg | ORAL_TABLET | Freq: Once | ORAL | Status: AC
  Administered 2023-01-09: 1000 mg via ORAL
  Filled 2023-01-09: qty 2

## 2023-01-09 MED ORDER — CHLORHEXIDINE GLUCONATE 0.12 % MT SOLN
15.0000 mL | Freq: Once | OROMUCOSAL | Status: AC
  Administered 2023-01-09: 15 mL via OROMUCOSAL
  Filled 2023-01-09: qty 15

## 2023-01-09 MED ORDER — BUPIVACAINE-MELOXICAM ER 400-12 MG/14ML IJ SOLN
INTRAMUSCULAR | Status: AC
  Filled 2023-01-09: qty 1

## 2023-01-09 MED ORDER — PROPOFOL 1000 MG/100ML IV EMUL
INTRAVENOUS | Status: AC
  Filled 2023-01-09: qty 100

## 2023-01-09 MED ORDER — AMLODIPINE BESYLATE 10 MG PO TABS
10.0000 mg | ORAL_TABLET | Freq: Every day | ORAL | Status: DC
  Administered 2023-01-10: 10 mg via ORAL
  Filled 2023-01-09: qty 1

## 2023-01-09 MED ORDER — FENTANYL CITRATE (PF) 250 MCG/5ML IJ SOLN
INTRAMUSCULAR | Status: AC
  Filled 2023-01-09: qty 5

## 2023-01-09 MED ORDER — METHOCARBAMOL 500 MG PO TABS: 500.0000 mg | ORAL_TABLET | Freq: Four times a day (QID) | ORAL | Status: DC | PRN

## 2023-01-09 MED ORDER — BUPIVACAINE IN DEXTROSE 0.75-8.25 % IT SOLN
INTRATHECAL | Status: DC | PRN
  Administered 2023-01-09: 1.6 mL via INTRATHECAL

## 2023-01-09 MED ORDER — FENTANYL CITRATE (PF) 100 MCG/2ML IJ SOLN: 25.0000 ug | INTRAMUSCULAR | Status: DC | PRN

## 2023-01-09 MED ORDER — SODIUM CHLORIDE 0.9 % IV SOLN: INTRAVENOUS | Status: DC

## 2023-01-09 MED ORDER — PROPOFOL 10 MG/ML IV BOLUS
INTRAVENOUS | Status: AC
  Filled 2023-01-09: qty 20

## 2023-01-09 SURGICAL SUPPLY — 91 items
ADH SKN CLS APL DERMABOND .7 (GAUZE/BANDAGES/DRESSINGS) ×1
ADH SKN CLS LQ APL DERMABOND (GAUZE/BANDAGES/DRESSINGS) ×1
ALCOHOL 70% 16 OZ (MISCELLANEOUS) ×2 IMPLANT
BAG COUNTER SPONGE SURGICOUNT (BAG) IMPLANT
BAG DECANTER FOR FLEXI CONT (MISCELLANEOUS) ×2 IMPLANT
BAG SPNG CNTER NS LX DISP (BAG)
BANDAGE ESMARK 6X9 LF (GAUZE/BANDAGES/DRESSINGS) IMPLANT
BLADE SAG 18X100X1.27 (BLADE) ×2 IMPLANT
BLADE SAW SGTL 73X25 THK (BLADE) ×2 IMPLANT
BNDG CMPR 9X6 STRL LF SNTH (GAUZE/BANDAGES/DRESSINGS)
BNDG ESMARK 6X9 LF (GAUZE/BANDAGES/DRESSINGS)
BOWL SMART MIX CTS (DISPOSABLE) ×2 IMPLANT
CEMENT BONE REFOBACIN R1X40 US (Cement) IMPLANT
CLSR STERI-STRIP ANTIMIC 1/2X4 (GAUZE/BANDAGES/DRESSINGS) ×4 IMPLANT
COMP FEM KNEE STD PS 7 LT (Joint) ×1 IMPLANT
COMP PATELLAR 10X35 METAL (Joint) ×1 IMPLANT
COMP TIB KNEE PS 0D LT (Joint) ×1 IMPLANT
COMPONENT FEM KNEE STD PS 7 LT (Joint) IMPLANT
COMPONENT PATELLAR 10X35 METAL (Joint) IMPLANT
COMPONENT TIB KNEE PS 0D LT (Joint) IMPLANT
COOLER ICEMAN CLASSIC (MISCELLANEOUS) ×2 IMPLANT
COVER SURGICAL LIGHT HANDLE (MISCELLANEOUS) ×2 IMPLANT
CUFF TOURN SGL QUICK 34 (TOURNIQUET CUFF) ×1
CUFF TOURN SGL QUICK 42 (TOURNIQUET CUFF) IMPLANT
CUFF TRNQT CYL 34X4.125X (TOURNIQUET CUFF) ×2 IMPLANT
DERMABOND ADVANCED .7 DNX12 (GAUZE/BANDAGES/DRESSINGS) ×2 IMPLANT
DERMABOND ADVANCED .7 DNX6 (GAUZE/BANDAGES/DRESSINGS) IMPLANT
DRAPE EXTREMITY T 121X128X90 (DISPOSABLE) ×2 IMPLANT
DRAPE HALF SHEET 40X57 (DRAPES) ×2 IMPLANT
DRAPE INCISE IOBAN 66X45 STRL (DRAPES) ×2 IMPLANT
DRAPE ORTHO SPLIT 77X108 STRL (DRAPES) ×2
DRAPE POUCH INSTRU U-SHP 10X18 (DRAPES) ×2 IMPLANT
DRAPE SURG ORHT 6 SPLT 77X108 (DRAPES) IMPLANT
DRAPE U-SHAPE 47X51 STRL (DRAPES) ×4 IMPLANT
DRSG AQUACEL AG ADV 3.5X10 (GAUZE/BANDAGES/DRESSINGS) ×2 IMPLANT
DURAPREP 26ML APPLICATOR (WOUND CARE) ×6 IMPLANT
ELECT CAUTERY BLADE 6.4 (BLADE) ×2 IMPLANT
ELECT PENCIL ROCKER SW 15FT (MISCELLANEOUS) ×2 IMPLANT
ELECT REM PT RETURN 9FT ADLT (ELECTROSURGICAL) ×1
ELECTRODE REM PT RTRN 9FT ADLT (ELECTROSURGICAL) ×2 IMPLANT
GLOVE BIOGEL PI IND STRL 7.0 (GLOVE) ×4 IMPLANT
GLOVE BIOGEL PI IND STRL 7.5 (GLOVE) ×10 IMPLANT
GLOVE ECLIPSE 7.0 STRL STRAW (GLOVE) ×6 IMPLANT
GLOVE INDICATOR 7.0 STRL GRN (GLOVE) ×2 IMPLANT
GLOVE INDICATOR 7.5 STRL GRN (GLOVE) ×2 IMPLANT
GLOVE SURG SYN 7.5  E (GLOVE) ×2
GLOVE SURG SYN 7.5 E (GLOVE) ×2 IMPLANT
GLOVE SURG SYN 7.5 PF PI (GLOVE) ×4 IMPLANT
GLOVE SURG UNDER LTX SZ7.5 (GLOVE) ×4 IMPLANT
GLOVE SURG UNDER POLY LF SZ7 (GLOVE) ×4 IMPLANT
GOWN STRL REIN XL XLG (GOWN DISPOSABLE) ×2 IMPLANT
GOWN STRL REUS W/ TWL LRG LVL3 (GOWN DISPOSABLE) ×2 IMPLANT
GOWN STRL REUS W/TWL LRG LVL3 (GOWN DISPOSABLE) ×1
GOWN TOGA ZIPPER T7+ PEEL AWAY (MISCELLANEOUS) ×4 IMPLANT
HANDPIECE INTERPULSE COAX TIP (DISPOSABLE) ×1
HDLS TROCR DRIL PIN KNEE 75 (PIN) ×1
HOOD PEEL AWAY T7 (MISCELLANEOUS) ×2 IMPLANT
KIT BASIN OR (CUSTOM PROCEDURE TRAY) ×2 IMPLANT
KIT TURNOVER KIT B (KITS) ×2 IMPLANT
MANIFOLD NEPTUNE II (INSTRUMENTS) ×2 IMPLANT
MARKER SKIN DUAL TIP RULER LAB (MISCELLANEOUS) ×4 IMPLANT
NDL SPNL 18GX3.5 QUINCKE PK (NEEDLE) ×2 IMPLANT
NEEDLE SPNL 18GX3.5 QUINCKE PK (NEEDLE) ×1 IMPLANT
NS IRRIG 1000ML POUR BTL (IV SOLUTION) ×2 IMPLANT
PACK TOTAL JOINT (CUSTOM PROCEDURE TRAY) ×2 IMPLANT
PAD ARMBOARD 7.5X6 YLW CONV (MISCELLANEOUS) ×4 IMPLANT
PAD COLD SHLDR WRAP-ON (PAD) ×2 IMPLANT
PAD ORTHO SHOULDER 7X19 LRG (SOFTGOODS) IMPLANT
PIN DRILL HDLS TROCAR 75 4PK (PIN) IMPLANT
SAW OSC TIP CART 19.5X105X1.3 (SAW) ×2 IMPLANT
SCREW FEMALE HEX FIX 25X2.5 (ORTHOPEDIC DISPOSABLE SUPPLIES) IMPLANT
SET HNDPC FAN SPRY TIP SCT (DISPOSABLE) ×2 IMPLANT
SOLUTION PRONTOSAN WOUND 350ML (IRRIGATION / IRRIGATOR) ×2 IMPLANT
STAPLER VISISTAT 35W (STAPLE) IMPLANT
STEM TIBIAL SZ6-7 EF 12 LT (Knees) IMPLANT
SUCTION FRAZIER HANDLE 10FR (MISCELLANEOUS) ×1
SUCTION TUBE FRAZIER 10FR DISP (MISCELLANEOUS) ×2 IMPLANT
SUT ETHILON 2 0 FS 18 (SUTURE) IMPLANT
SUT MNCRL AB 3-0 PS2 27 (SUTURE) IMPLANT
SUT VIC AB 0 CT1 27 (SUTURE) ×2
SUT VIC AB 0 CT1 27XBRD ANBCTR (SUTURE) ×4 IMPLANT
SUT VIC AB 1 CTX 27 (SUTURE) ×6 IMPLANT
SUT VIC AB 2-0 CT1 27 (SUTURE) ×4
SUT VIC AB 2-0 CT1 TAPERPNT 27 (SUTURE) ×8 IMPLANT
SYR 50ML LL SCALE MARK (SYRINGE) ×4 IMPLANT
TOWEL GREEN STERILE (TOWEL DISPOSABLE) ×2 IMPLANT
TOWEL GREEN STERILE FF (TOWEL DISPOSABLE) ×2 IMPLANT
TRAY CATH 16FR W/PLASTIC CATH (SET/KITS/TRAYS/PACK) IMPLANT
TUBE SUCT ARGYLE STRL (TUBING) ×2 IMPLANT
UNDERPAD 30X36 HEAVY ABSORB (UNDERPADS AND DIAPERS) ×2 IMPLANT
YANKAUER SUCT BULB TIP NO VENT (SUCTIONS) ×4 IMPLANT

## 2023-01-09 NOTE — Anesthesia Procedure Notes (Signed)
Spinal  Patient location during procedure: OR Start time: 01/09/2023 7:15 AM End time: 01/09/2023 7:20 AM Reason for block: surgical anesthesia Staffing Performed: anesthesiologist  Anesthesiologist: Murvin Natal, MD Performed by: Murvin Natal, MD Authorized by: Murvin Natal, MD   Preanesthetic Checklist Completed: patient identified, IV checked, risks and benefits discussed, surgical consent, monitors and equipment checked, pre-op evaluation and timeout performed Spinal Block Patient position: sitting Prep: DuraPrep Patient monitoring: cardiac monitor, continuous pulse ox and blood pressure Approach: midline Location: L4-5 Injection technique: single-shot Needle Needle type: Pencan  Needle gauge: 24 G Needle length: 9 cm Assessment Sensory level: T10 Events: CSF return Additional Notes Functioning IV was confirmed and monitors were applied. Sterile prep and drape, including hand hygiene and sterile gloves were used. The patient was positioned and the spine was prepped. The skin was anesthetized with lidocaine.  Free flow of clear CSF was obtained prior to injecting local anesthetic into the CSF.  The spinal needle aspirated freely following injection.  The needle was carefully withdrawn.  The patient tolerated the procedure well.

## 2023-01-09 NOTE — Anesthesia Procedure Notes (Signed)
Anesthesia Regional Block: Adductor canal block   Pre-Anesthetic Checklist: , timeout performed,  Correct Patient, Correct Site, Correct Laterality,  Correct Procedure,, site marked,  Risks and benefits discussed,  Surgical consent,  Pre-op evaluation,  At surgeon's request and post-op pain management  Laterality: Left  Prep: chloraprep       Needles:  Injection technique: Single-shot  Needle Type: Echogenic Stimulator Needle     Needle Length: 10cm  Needle Gauge: 20     Additional Needles:   Procedures:,,,, ultrasound used (permanent image in chart),,    Narrative:  Start time: 01/09/2023 6:50 AM End time: 01/09/2023 7:00 AM Injection made incrementally with aspirations every 5 mL.  Performed by: Personally  Anesthesiologist: Murvin Natal, MD  Additional Notes: Functioning IV was confirmed and monitors were applied. A time-out was performed. Hand hygiene and sterile gloves were used. The thigh was placed in a frog-leg position and prepped in a sterile fashion. A 142m 20ga Bbraun echogenic stimulator needle was placed using ultrasound guidance.  Negative aspiration and negative test dose prior to incremental administration of local anesthetic. The patient tolerated the procedure well.

## 2023-01-09 NOTE — Transfer of Care (Signed)
Immediate Anesthesia Transfer of Care Note  Patient: Craig House  Procedure(s) Performed: LEFT TOTAL KNEE ARTHROPLASTY (Left: Knee)  Patient Location: PACU  Anesthesia Type:MAC and Spinal  Level of Consciousness: drowsy  Airway & Oxygen Therapy: Patient Spontanous Breathing and Patient connected to face mask oxygen  Post-op Assessment: Report given to RN  Post vital signs: Reviewed and stable  Last Vitals:  Vitals Value Taken Time  BP 94/70 01/09/23 0911  Temp    Pulse 74 01/09/23 0914  Resp 10 01/09/23 0914  SpO2 97 % 01/09/23 0914  Vitals shown include unvalidated device data.  Last Pain:  Vitals:   01/09/23 0603  TempSrc:   PainSc: 8       Patients Stated Pain Goal: 0 ([phone])  Complications: No notable events documented.

## 2023-01-09 NOTE — Discharge Instructions (Signed)

## 2023-01-09 NOTE — Anesthesia Postprocedure Evaluation (Signed)
Anesthesia Post Note  Patient: Craig House  Procedure(s) Performed: LEFT TOTAL KNEE ARTHROPLASTY (Left: Knee)     Patient location during evaluation: PACU Anesthesia Type: Regional and Spinal Level of consciousness: awake Pain management: pain level controlled Vital Signs Assessment: post-procedure vital signs reviewed and stable Respiratory status: spontaneous breathing, nonlabored ventilation and respiratory function stable Cardiovascular status: blood pressure returned to baseline and stable Postop Assessment: no apparent nausea or vomiting Anesthetic complications: no   No notable events documented.  Last Vitals:  Vitals:   01/09/23 1239 01/09/23 1927  BP:  (!) 144/83  Pulse:    Resp:  12  Temp:  36.7 C  SpO2: 100% 98%    Last Pain:  Vitals:   01/09/23 1927  TempSrc:   PainSc: 8                  Govanni Plemons P Albertha Beattie

## 2023-01-09 NOTE — Progress Notes (Signed)
Dr. Erlinda Hong and Dr. Roanna Banning made aware that patient's creatinine has increased from 1.23 on 12/09/22 to 1.70 on 01/04/23. No new orders received at this time.

## 2023-01-09 NOTE — H&P (Signed)
PREOPERATIVE H&P  Chief Complaint: LEFT KNEE OSTEOARTHRITIS  HPI: Craig House is a 62 y.o. male who presents for surgical treatment of LEFT KNEE OSTEOARTHRITIS.  He denies any changes in medical history.  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 II   Hyperplastic colon polyp    Hypertension    Obesity    Past Surgical History:  Procedure Laterality Date   KNEE ARTHROSCOPY Bilateral    Social History   Socioeconomic History   Marital status: Single    Spouse name: Not on file   Number of children: Not on file   Years of education: Not on file   Highest education level: Not on file  Occupational History   Not on file  Tobacco Use   Smoking status: Former   Smokeless tobacco: Never  Vaping Use   Vaping Use: Never used  Substance and Sexual Activity   Alcohol use: Yes    Alcohol/week: 1.0 standard drink of alcohol    Types: 1 Standard drinks or equivalent per week    Comment: 1 glass of red wine at night   Drug use: No   Sexual activity: Yes  Other Topics Concern   Not on file  Social History Narrative   Not on file   Social Determinants of Health   Financial Resource Strain: Not on file  Food Insecurity: Not on file  Transportation Needs: Not on file  Physical Activity: Not on file  Stress: Not on file  Social Connections: Not on file   Family History  Problem Relation Age of Onset   Heart attack Mother    Cancer Father    Heart disease Father    Colon cancer Neg Hx    No Known Allergies Prior to Admission medications   Medication Sig Start Date End Date Taking? Authorizing Provider  albuterol (VENTOLIN HFA) 108 (90 Base) MCG/ACT inhaler INHALE 2 PUFFS BY MOUTH EVERY 6 HOURS AS NEEDED FOR WHEEZING OR SHORTNESS OF BREATH 11/09/21  Yes Saguier, Percell Miller, PA-C  amLODipine (NORVASC) 10 MG tablet 1 tab po q day 12/09/22  Yes Saguier, Iris Pert   aspirin EC 81 MG tablet Take 81 mg by mouth every Monday, Wednesday, and Friday.   Yes [provider]  atorvastatin (LIPITOR) 20 MG tablet TAKE 1 TABLET BY MOUTH EVERY DAY 12/09/22  Yes Saguier, Percell Miller, PA-C  Brimonidine Tartrate (LUMIFY) 0.025 % SOLN Place 1 drop into both eyes daily as needed (redness).   Yes [provider]  Cholecalciferol (DIALYVITE VITAMIN D 5000) 125 MCG (5000 UT) capsule Take 5,000 Units by mouth every other day.   Yes [provider]  Cinnamon 500 MG TABS Take 500 mg by mouth daily.   Yes [provider]  Iron-Vitamin C 65-125 MG TABS Take by mouth.   Yes [provider]  metFORMIN (GLUCOPHAGE) 500 MG tablet Take 1 tablet (500 mg total) by mouth 2 (two) times daily with a meal. 04/06/22  Yes Saguier, Percell Miller, PA-C  Multiple Vitamin (MULTI-VITAMINS) TABS Take 1 tablet by mouth daily.   Yes [provider]  naproxen sodium (ANAPROX) 220 MG tablet Take 220 mg by mouth at bedtime as needed (pain).   Yes [provider]  Semaglutide,0.25 or 0.'5MG'$ /DOS, (OZEMPIC, 0.25 OR 0.5 MG/DOSE,) 2 MG/3ML SOPN Inject 0.'5mg'$  into the skin once weekly 03/04/22  Yes  Saguier, Percell Miller, PA-C  triamterene-hydrochlorothiazide (DYAZIDE) 37.5-25 MG capsule TAKE 1 CAPSULE BY MOUTH EVERY MORNING 06/13/22  Yes Saguier, Percell Miller, PA-C  TURMERIC PO Take 1,000 mg by mouth daily.   Yes [provider]  aspirin EC 81 MG tablet Take 1 tablet (81 mg total) by mouth in the morning and at bedtime. To be taken after surgery to prevent blood clots 01/02/23 01/02/24  Aundra Dubin, PA-C  buPROPion (WELLBUTRIN XL) 150 MG 24 hr tablet Take 1 tablet (150 mg total) by mouth daily. Patient not taking: Reported on 01/03/2023 02/25/22   Saguier, Percell Miller, PA-C  cefadroxil (DURICEF) 500 MG capsule Take 1 capsule (500 mg total) by mouth 2 (two) times daily. To be taken after surgery 01/02/23   Aundra Dubin, PA-C  docusate sodium (COLACE) 100 MG capsule Take 1  capsule (100 mg total) by mouth daily as needed. 01/02/23 01/02/24  Aundra Dubin, PA-C  insulin aspart (NOVOLOG FLEXPEN) 100 UNIT/ML FlexPen 4 units injection with  each meal tid Patient not taking: Reported on 01/03/2023 06/06/22   Saguier, Percell Miller, PA-C  methocarbamol (ROBAXIN-750) 750 MG tablet Take 1 tablet (750 mg total) by mouth 2 (two) times daily as needed for muscle spasms. To be taken after surgery 01/02/23   Aundra Dubin, PA-C  ondansetron (ZOFRAN) 4 MG tablet Take 1 tablet (4 mg total) by mouth every 8 (eight) hours as needed for nausea or vomiting. 01/02/23   Aundra Dubin, PA-C  oxyCODONE-acetaminophen (PERCOCET) 5-325 MG tablet Take 1-2 tablets by mouth every 6 (six) hours as needed. To be taken after surgery 01/02/23   Aundra Dubin, PA-C  Semaglutide, 1 MG/DOSE, 4 MG/3ML SOPN Inject 1 mg as directed once a week. 12/12/22   Saguier, Percell Miller, PA-C     Positive ROS: All other systems have been reviewed and were otherwise negative with the exception of those mentioned in the HPI and as above.  Physical Exam: General: Alert, no acute distress Cardiovascular: No pedal edema Respiratory: No cyanosis, no use of accessory musculature GI: abdomen soft Skin: No lesions in the area of chief complaint Neurologic: Sensation intact distally Psychiatric: Patient is competent for consent with normal mood and affect Lymphatic: no lymphedema  MUSCULOSKELETAL: exam stable  Assessment: LEFT KNEE OSTEOARTHRITIS  Plan: Plan for Procedure(s): LEFT TOTAL KNEE ARTHROPLASTY  The risks benefits and alternatives were discussed with the patient including but not limited to the risks of nonoperative treatment, versus surgical intervention including infection, bleeding, nerve injury,  blood clots, cardiopulmonary complications, morbidity, mortality, among others, and they were willing to proceed.   Eduard Roux, MD 01/09/2023 5:49 AM

## 2023-01-09 NOTE — Op Note (Signed)
Total Knee Arthroplasty Procedure Note  Preoperative diagnosis: Left knee osteoarthritis  Postoperative diagnosis:same  Operative findings: Eburnated articular surfaces Varus deformity Excellent bone quality  Operative procedure: Left total knee arthroplasty. CPT [phone]  Surgeon: N. Eduard Roux, MD  Assist: Madalyn Rob, PA-C; necessary for the timely completion of procedure and due to complexity of procedure.  Anesthesia: Spinal, regional  Tourniquet time: see anesthesia record  Implants used: Zimmer persona uncemented Femur: CR 7 Tibia: F Patella: 35 mm Polyethylene: 12 mm, MC  Indication: Craig House is a 62 y.o. year old male with a history of knee pain. Having failed conservative management, the patient elected to proceed with a total knee arthroplasty.  We have reviewed the risk and benefits of the surgery and they elected to proceed after voicing understanding.  Procedure:  After informed consent was obtained and understanding of the risk were voiced including but not limited to bleeding, infection, damage to surrounding structures including nerves and vessels, blood clots, leg length inequality and the failure to achieve desired results, the operative extremity was marked with verbal confirmation of the patient in the holding area.   The patient was then brought to the operating room and transported to the operating room table in the supine position.  A tourniquet was applied to the operative extremity around the upper thigh. The operative limb was then prepped and draped in the usual sterile fashion and preoperative antibiotics were administered.  A time out was performed prior to the start of surgery confirming the correct extremity, preoperative antibiotic administration, as well as team members, implants and instruments available for the case. Correct surgical site was also confirmed with preoperative radiographs. The limb was then elevated for  exsanguination and the tourniquet was inflated. A midline incision was made and a standard medial parapatellar approach was performed.  The patella was prepared and sized to a 35 mm.  A cover was placed on the patella for protection from retractors.  We then turned our attention to the femur. Posterior cruciate ligament was sacrificed. Start site was drilled in the femur and the intramedullary distal femoral cutting guide was placed, set at 5 degrees valgus, taking 10 mm of distal resection. The distal cut was made. Osteophytes were then removed.   Next, the proximal tibial cutting guide was placed with appropriate slope, varus/valgus alignment and depth of resection. The proximal tibial cut was made taking 2 mm off the low side. Gap blocks were then used to assess the extension gap and alignment, and appropriate soft tissue releases were performed. Attention was turned back to the femur, which was sized using the sizing guide to a size 7. Appropriate rotation of the femoral component was determined using epicondylar axis, Whiteside's line, and assessing the flexion gap under ligament tension. The appropriate size 4-in-1 cutting block was placed and cuts were made.  Posterior femoral osteophytes and uncapped bone were then removed with the curved osteotome.  Trial components were placed, and stability was checked in full extension, mid-flexion, and deep flexion. Proper tibial rotation was determined and marked.  The patella tracked well without a lateral release. Trial components were then removed and tibial preparation performed.  The tibia was sized for a size F component.  The bony surfaces were irrigated with a pulse lavage and then dried. Final components were placed.  The stability of the construct was re-evaluated throughout a range of motion and found to be acceptable. The trial liner was removed, the knee was copiously irrigated,  and the knee was re-evaluated for any excess bone debris. The real  polyethylene liner, 12 mm thick, was inserted and checked to ensure the locking mechanism had engaged appropriately. The tourniquet was deflated and hemostasis was achieved. The wound was irrigated with normal saline.  One gram of vancomycin powder was placed in the surgical bed.  Topical 0.25% bupivacaine and meloxicam was placed in the joint for postoperative pain.  Capsular closure was performed with a #1 vicryl, subcutaneous fat closed with a 0 vicryl suture, then subcutaneous tissue closed with interrupted 2.0 vicryl suture. The skin was then closed with a 2.0 nylon and dermabond. A sterile dressing was applied.  The patient was awakened in the operating room and taken to recovery in stable condition. All sponge, needle, and instrument counts were correct at the end of the case.  Craig House was necessary for opening, closing, retracting, limb positioning and overall facilitation and completion of the surgery.  Position: supine  Complications: none.  Time Out: performed   Drains/Packing: none  Estimated blood loss: minimal  Returned to Recovery Room: in good condition.   Antibiotics: yes   Mechanical VTE (DVT) Prophylaxis: sequential compression devices, TED thigh-high  Chemical VTE (DVT) Prophylaxis: aspirin POD 0  Fluid Replacement  Crystalloid: see anesthesia record Blood: none  FFP: none   Specimens Removed: 1 to pathology   Sponge and Instrument Count Correct? yes   PACU: portable radiograph - knee AP and Lateral   Plan/RTC: Return in 2 weeks for wound check.   Weight Bearing/Load Lower Extremity: full   * No implants in log *  N. Eduard Roux, MD [facility] 8:39 AM

## 2023-01-09 NOTE — Evaluation (Signed)
Physical Therapy Evaluation Patient Details Name: Fredrico Passon MRN: [id number] DOB: [birth year] Today's Date: 01/09/2023  History of Present Illness  62 y.o. male presents to [facility] hospital on 01/09/2023 for elective L TKA. PMH includes anemia, asthma, DMII, HTN.  Clinical Impression  Pt presents to PT with deficits in ROM, strength, power, balance, endurance, and pain. Pt is mobilizing well, transferring and ambulating with support of RW. PT provides education on TKA HEP. Pt will benefit from aggressive mobilization in an effort to improve strength and activity tolerance. PT will follow tomorrow for further gait and stair training.       Recommendations for follow up therapy are one component of a multi-disciplinary discharge planning process, led by the attending physician.  Recommendations may be updated based on patient status, additional functional criteria and insurance authorization.  Follow Up Recommendations Follow physician's recommendations for discharge plan and follow up therapies      Assistance Recommended at Discharge Intermittent Supervision/Assistance  Patient can return home with the following  A little help with bathing/dressing/bathroom;Help with stairs or ramp for entrance;Assist for transportation    Equipment Recommendations None recommended by PT  Recommendations for Other Services       Functional Status Assessment Patient has had a recent decline in their functional status and demonstrates the ability to make significant improvements in function in a reasonable and predictable amount of time.     Precautions / Restrictions Precautions Precautions: Fall;Knee Restrictions Weight Bearing Restrictions: Yes LLE Weight Bearing: Weight bearing as tolerated      Mobility  Bed Mobility Overal bed mobility: Needs Assistance Bed Mobility: Supine to Sit, Sit to Supine     Supine to sit: Supervision Sit to supine: Supervision        Transfers Overall  transfer level: Needs assistance Equipment used: Rolling walker (2 wheels) Transfers: Sit to/from Stand Sit to Stand: Min guard                Ambulation/Gait Ambulation/Gait assistance: Supervision Gait Distance (Feet): 40 Feet Assistive device: Rolling walker (2 wheels) Gait Pattern/deviations: Step-to pattern Gait velocity: reduced Gait velocity interpretation: <1.8 ft/sec, indicate of risk for recurrent falls   General Gait Details: slowed step-to gait  Stairs            Wheelchair Mobility    Modified Rankin (Stroke Patients Only)       Balance Overall balance assessment: Needs assistance Sitting-balance support: No upper extremity supported, Feet supported Sitting balance-Leahy Scale: Good     Standing balance support: Bilateral upper extremity supported, Reliant on assistive device for balance Standing balance-Leahy Scale: Poor                               Pertinent Vitals/Pain Pain Assessment Pain Assessment: 0-10 Pain Score: 8  Pain Location: L knee Pain Descriptors / Indicators: Aching Pain Intervention(s): Premedicated before session    Home Living Family/patient expects to be discharged to:: Private residence Living Arrangements: Spouse/significant other Available Help at Discharge: Family;Available 24 hours/day Type of Home: House Home Access: Stairs to enter Entrance Stairs-Rails: Can reach both Entrance Stairs-Number of Steps: 5 Alternate Level Stairs-Number of Steps: 7 Home Layout: Two level Home Equipment: Conservation officer, nature (2 wheels);Crutches      Prior Function Prior Level of Function : Independent/Modified Independent;Working/employed;Driving                     Hand Dominance  Extremity/Trunk Assessment   Upper Extremity Assessment Upper Extremity Assessment: Overall WFL for tasks assessed    Lower Extremity Assessment Lower Extremity Assessment: LLE deficits/detail LLE Deficits / Details:  pt is able to perform SLR, ROM limited at knee as expected post-TKA    Cervical / Trunk Assessment Cervical / Trunk Assessment: Normal  Communication   Communication: No difficulties  Cognition Arousal/Alertness: Awake/alert Behavior During Therapy: WFL for tasks assessed/performed Overall Cognitive Status: Within Functional Limits for tasks assessed                                          General Comments General comments (skin integrity, edema, etc.): VSS on RA    Exercises     Assessment/Plan    PT Assessment Patient needs continued PT services  PT Problem List Decreased strength;Decreased range of motion;Decreased activity tolerance;Decreased balance;Decreased mobility;Decreased knowledge of use of DME;Decreased safety awareness;Pain       PT Treatment Interventions DME instruction;Gait training;Stair training;Functional mobility training;Therapeutic activities;Therapeutic exercise;Neuromuscular re-education;Balance training;Patient/family education    PT Goals (Current goals can be found in the Care Plan section)  Acute Rehab PT Goals Patient Stated Goal: to go home PT Goal Formulation: With patient Time For Goal Achievement: 01/14/23 Potential to Achieve Goals: Good    Frequency 7X/week     Co-evaluation               AM-PAC PT "6 Clicks" Mobility  Outcome Measure Help needed turning from your back to your side while in a flat bed without using bedrails?: A Little Help needed moving from lying on your back to sitting on the side of a flat bed without using bedrails?: A Little Help needed moving to and from a bed to a chair (including a wheelchair)?: A Little Help needed standing up from a chair using your arms (e.g., wheelchair or bedside chair)?: A Little Help needed to walk in hospital room?: A Little Help needed climbing 3-5 steps with a railing? : Total 6 Click Score: 16    End of Session   Activity Tolerance: Patient tolerated  treatment well Patient left: in bed;with call bell/phone within reach;with bed alarm set Nurse Communication: Mobility status PT Visit Diagnosis: Other abnormalities of gait and mobility (R26.89);Muscle weakness (generalized) (M62.81);Pain Pain - Right/Left: Left Pain - part of body: Knee    Time: [id number] PT Time Calculation (min) (ACUTE ONLY): 21 min   Charges:   PT Evaluation $PT Eval Low Complexity: 1 Low          Zenaida Niece, PT, DPT Acute Rehabilitation Office [phone]   Zenaida Niece 01/09/2023, 3:55 PM

## 2023-01-10 ENCOUNTER — Encounter (HOSPITAL_COMMUNITY): Payer: Self-pay | Admitting: Orthopaedic Surgery

## 2023-01-10 ENCOUNTER — Telehealth: Payer: Self-pay | Admitting: Orthopaedic Surgery

## 2023-01-10 DIAGNOSIS — M1712 Unilateral primary osteoarthritis, left knee: Secondary | ICD-10-CM | POA: Diagnosis not present

## 2023-01-10 LAB — CBC
HCT: 40.4 % (ref 39.0–52.0)
Hemoglobin: 14 g/dL (ref 13.0–17.0)
MCH: 32.6 pg (ref 26.0–34.0)
MCHC: 34.7 g/dL (ref 30.0–36.0)
MCV: 94.2 fL (ref 80.0–100.0)
Platelets: 347 10*3/uL (ref 150–400)
RBC: 4.29 MIL/uL (ref 4.22–5.81)
RDW: 12.3 % (ref 11.5–15.5)
WBC: 13.2 10*3/uL — ABNORMAL HIGH (ref 4.0–10.5)
nRBC: 0 % (ref 0.0–0.2)

## 2023-01-10 LAB — GLUCOSE, CAPILLARY
Glucose-Capillary: 176 mg/dL — ABNORMAL HIGH (ref 70–99)
Glucose-Capillary: 199 mg/dL — ABNORMAL HIGH (ref 70–99)

## 2023-01-10 NOTE — Progress Notes (Signed)
Physical Therapy Treatment Patient Details Name: Craig House MRN: [id number] DOB: 05/25/1961 Today's Date: 01/10/2023   History of Present Illness 62 y.o. male presents to [facility] hospital on 01/09/2023 for elective L TKA. PMH includes anemia, asthma, DMII, HTN.    PT Comments    Patient progressing well towards PT goals. Session focused on progressive ambulation and functional mobility. Requires Min guard assist for transfers and gait training today with cues for knee extension during stance phase. Reports dizziness with ambulation and diaphoresis present. HR up to 121 pm with increased pain during activity. ~8-75 degrees knee AROM today. Reviewed some there ex. Will plan for stair training in second session to prepare pt for d/c home. Will follow acutely.   Recommendations for follow up therapy are one component of a multi-disciplinary discharge planning process, led by the attending physician.  Recommendations may be updated based on patient status, additional functional criteria and insurance authorization.  Follow Up Recommendations  Follow physician's recommendations for discharge plan and follow up therapies     Assistance Recommended at Discharge Intermittent Supervision/Assistance  Patient can return home with the following A little help with bathing/dressing/bathroom;Help with stairs or ramp for entrance;Assist for transportation   Equipment Recommendations  None recommended by PT    Recommendations for Other Services       Precautions / Restrictions Precautions Precautions: Fall;Knee Restrictions Weight Bearing Restrictions: Yes LLE Weight Bearing: Weight bearing as tolerated     Mobility  Bed Mobility Overal bed mobility: Needs Assistance Bed Mobility: Supine to Sit     Supine to sit: Supervision, HOB elevated     General bed mobility comments: Increased time, use of rail to get to EOB.    Transfers Overall transfer level: Needs assistance Equipment  used: Rolling walker (2 wheels) Transfers: Sit to/from Stand Sit to Stand: Min guard, From elevated surface           General transfer comment: Stood from EOB x2, once from elevated bed height to simulate bed at home and once from lower surface, cues for hand placement/technique.    Ambulation/Gait Ambulation/Gait assistance: Min guard Gait Distance (Feet): 90 Feet Assistive device: Rolling walker (2 wheels) Gait Pattern/deviations: Step-through pattern, Decreased stance time - left, Decreased step length - right, Trunk flexed, Knee flexed in stance - left Gait velocity: reduced     General Gait Details: Slow, mildly unsteady gait with left knee flexed throughout gait cycle with cues for knee extension during stance phase. DIzziness with walking, diaphoresis present. HR up to 121 bpm.   Stairs             Wheelchair Mobility    Modified Rankin (Stroke Patients Only)       Balance Overall balance assessment: Needs assistance Sitting-balance support: Feet supported, No upper extremity supported Sitting balance-Leahy Scale: Good     Standing balance support: During functional activity, Reliant on assistive device for balance Standing balance-Leahy Scale: Poor Standing balance comment: Requires UE support in standing.                            Cognition Arousal/Alertness: Awake/alert Behavior During Therapy: WFL for tasks assessed/performed Overall Cognitive Status: Within Functional Limits for tasks assessed                                 General Comments: Sleepy towards end of session.  Exercises Total Joint Exercises Ankle Circles/Pumps: AROM, Both, 10 reps, Seated Quad Sets: AROM, Both, 10 reps, Seated Goniometric ROM: 8-75 degrees knee AROM    General Comments General comments (skin integrity, edema, etc.): HR up to 121 bpm with activity. Girlfriend present during session.      Pertinent Vitals/Pain Pain  Assessment Pain Assessment: 0-10 Pain Score: 8  Pain Location: L knee with mobility Pain Descriptors / Indicators: Sore, Operative site guarding, Guarding, Grimacing Pain Intervention(s): Monitored during session, Premedicated before session, Repositioned, Limited activity within patient's tolerance    Home Living                          Prior Function            PT Goals (current goals can now be found in the care plan section) Progress towards PT goals: Progressing toward goals    Frequency    7X/week      PT Plan Current plan remains appropriate    Co-evaluation              AM-PAC PT "6 Clicks" Mobility   Outcome Measure  Help needed turning from your back to your side while in a flat bed without using bedrails?: A Little Help needed moving from lying on your back to sitting on the side of a flat bed without using bedrails?: A Little Help needed moving to and from a bed to a chair (including a wheelchair)?: A Little Help needed standing up from a chair using your arms (e.g., wheelchair or bedside chair)?: A Little Help needed to walk in hospital room?: A Little Help needed climbing 3-5 steps with a railing? : A Lot 6 Click Score: 17    End of Session Equipment Utilized During Treatment: Gait belt Activity Tolerance: Patient limited by pain;Patient tolerated treatment well Patient left: in chair;with call bell/phone within reach;with family/visitor present Nurse Communication: Mobility status PT Visit Diagnosis: Other abnormalities of gait and mobility (R26.89);Muscle weakness (generalized) (M62.81);Pain Pain - Right/Left: Left Pain - part of body: Knee     Time: [id number] PT Time Calculation (min) (ACUTE ONLY): 31 min  Charges:  $Gait Training: 8-22 mins $Therapeutic Activity: 8-22 mins                     Marisa Severin, PT, DPT Acute Rehabilitation Services Secure chat preferred Office [place] 01/10/2023, 9:04 AM

## 2023-01-10 NOTE — Discharge Planning (Signed)
Discharge order put in by provider, patient still has not seen PT again for a second session and still having nausea/vomiting.

## 2023-01-10 NOTE — Evaluation (Signed)
Occupational Therapy Evaluation Patient Details Name: Craig House MRN: [id number] DOB: [birth year] Today's Date: 01/10/2023   History of Present Illness 62 y.o. male presents to [facility] hospital on 01/09/2023 for elective L TKA. PMH includes anemia, asthma, DMII, HTN.   Clinical Impression   PTA pt independent with ADL and mobility. Nauseated during session and had received meds, making him lethargic. Able to moiblize @ minguard level and complete LB ADL with Min A. Educated pt/girlfriend on comensatory strategies for ADL and functional mobility @ RW level. Education on home set up and strategies to reduce falls provided. Pt/girlfriend verbalized understanding. Plan to follow while on acute to facilitate safe DC home.      Recommendations for follow up therapy are one component of a multi-disciplinary discharge planning process, led by the attending physician.  Recommendations may be updated based on patient status, additional functional criteria and insurance authorization.   Follow Up Recommendations  Follow physician's recommendations for discharge plan and follow up therapies     Assistance Recommended at Discharge Intermittent Supervision/Assistance  Patient can return home with the following A little help with bathing/dressing/bathroom;Assistance with cooking/housework;Assist for transportation    Functional Status Assessment  Patient has had a recent decline in their functional status and demonstrates the ability to make significant improvements in function in a reasonable and predictable amount of time.  Equipment Recommendations  None recommended by OT    Recommendations for Other Services       Precautions / Restrictions Precautions Precautions: Fall;Knee Restrictions Weight Bearing Restrictions: Yes LLE Weight Bearing: Weight bearing as tolerated      Mobility Bed Mobility Overal bed mobility: Needs Assistance Bed Mobility: Supine to Sit     Supine to sit:  Supervision, HOB elevated Sit to supine: Supervision   General bed mobility comments: OOB in chair    Transfers Overall transfer level: Needs assistance Equipment used: Rolling walker (2 wheels) Transfers: Sit to/from Stand Sit to Stand: Min guard, From elevated surface              Balance Overall balance assessment: Needs assistance Sitting-balance support: Feet supported, No upper extremity supported Sitting balance-Leahy Scale: Good     Standing balance support: During functional activity, Reliant on assistive device for balance Standing balance-Leahy Scale: Poor Standing balance comment: Requires UE support in standing.                           ADL either performed or assessed with clinical judgement   ADL Overall ADL's : Needs assistance/impaired     Grooming: Set up   Upper Body Bathing: Set up;Sitting   Lower Body Bathing: Minimal assistance;Sit to/from stand   Upper Body Dressing : Set up;Sitting   Lower Body Dressing: Sit to/from stand;Minimal assistance   Toilet Transfer: Ambulation;Min guard   Toileting- Clothing Manipulation and Hygiene: Minimal assistance;Sit to/from Nurse, children's Details (indicate cue type and reason): unable to sfaely step over tub at this time Functional mobility during ADLs: Min guard;Rolling walker (2 wheels);Cueing for safety General ADL Comments: Began education on compensatory strategies; educated on home safety and set up to maximize independence with ADL and reduce risk of falls; girlfriend can assist as needed; recommended sponge bathing until safe enough to step over tub; educated on option for placing 3in1 facing tub so that pt can sit on 3in1 facing out of tub without need to step over - pt/girlfriend verbalized understanding  Vision Baseline Vision/History: 0 No visual deficits       Perception     Praxis      Pertinent Vitals/Pain Pain Assessment Pain Assessment: Faces Faces  Pain Scale: Hurts little more Pain Location: L knee with mobility Pain Descriptors / Indicators: Sore, Operative site guarding, Guarding, Grimacing Pain Intervention(s): Limited activity within patient's tolerance, Premedicated before session     Hand Dominance Right   Extremity/Trunk Assessment Upper Extremity Assessment Upper Extremity Assessment: Overall WFL for tasks assessed   Lower Extremity Assessment Lower Extremity Assessment: Defer to PT evaluation LLE Deficits / Details: pt is able to perform SLR, ROM limited at knee as expected post-TKA   Cervical / Trunk Assessment Cervical / Trunk Assessment: Normal   Communication Communication Communication: No difficulties   Cognition Arousal/Alertness: Awake/alert Behavior During Therapy: WFL for tasks assessed/performed Overall Cognitive Status: Within Functional Limits for tasks assessed                                 General Comments: Sleepy     General Comments  educated on importance of positioning for knee and terminal knee extension    Exercises   Shoulder Instructions      Home Living Family/patient expects to be discharged to:: Private residence Living Arrangements: Spouse/significant other Available Help at Discharge: Family;Available 24 hours/day Type of Home: House Home Access: Stairs to enter [employer] of Steps: 5 Entrance Stairs-Rails: Can reach both Home Layout: Two level Alternate Level Stairs-Number of Steps: 7 Alternate Level Stairs-Rails: Right Bathroom Shower/Tub: Teacher, early years/pre: Standard Bathroom Accessibility: Yes How Accessible: Accessible via walker Home Equipment: Rolling Walker (2 wheels);Crutches;BSC/3in1;Shower seat          Prior Functioning/Environment Prior Level of Function : Independent/Modified Independent;Working/employed;Driving             Mobility Comments: works in [employer] Problem List:  Decreased strength;Decreased range of motion;Impaired balance (sitting and/or standing);Decreased knowledge of use of DME or AE;Decreased safety awareness;Decreased knowledge of precautions;Pain      OT Treatment/Interventions: Self-care/ADL training;DME and/or AE instruction;Therapeutic activities;Patient/family education;Balance training    OT Goals(Current goals can be found in the care plan section) Acute Rehab OT Goals Patient Stated Goal: to get better OT Goal Formulation: With patient Time For Goal Achievement: 01/24/23 Potential to Achieve Goals: Good  OT Frequency: Min 2X/week    Co-evaluation              AM-PAC OT "6 Clicks" Daily Activity     Outcome Measure Help from another person eating meals?: None Help from another person taking care of personal grooming?: A Little Help from another person toileting, which includes using toliet, bedpan, or urinal?: A Little Help from another person bathing (including washing, rinsing, drying)?: A Little Help from another person to put on and taking off regular upper body clothing?: None Help from another person to put on and taking off regular lower body clothing?: A Little 6 Click Score: 20   End of Session Equipment Utilized During Treatment: Gait belt;Rolling walker (2 wheels) Nurse Communication: Mobility status;Precautions  Activity Tolerance: Patient tolerated treatment well Patient left: in chair;with call bell/phone within reach;with family/visitor present  OT Visit Diagnosis: Unsteadiness on feet (R26.81);Other abnormalities of gait and mobility (R26.89);Muscle weakness (generalized) (M62.81);Pain Pain - Right/Left: Left Pain - part of body: Knee  Time: 1040-1106 OT Time Calculation (min): 26 min Charges:  OT General Charges $OT Visit: 1 Visit OT Evaluation $OT Eval Low Complexity: 1 Low OT Treatments $Self Care/Home Management : 8-22 mins  Maurie Boettcher, OT/L   Acute OT Clinical  Specialist Acute Rehabilitation Services Pager [phone] Office [phone]   [facility] 01/10/2023, 12:16 PM

## 2023-01-10 NOTE — TOC Transition Note (Signed)
Transition of Care [facility]) - CM/SW Discharge Note   Patient Details  Name: Craig House MRN: [id number] Date of Birth: Mar 08, 1961  [facility] of [facility]) CM/SW Contact:  Sharin Mons, RN Phone Number: 01/10/2023, 10:16 AM   Clinical Narrative:    Patient will DC to: home Anticipated DC date: 01/10/2023 Family notified: yes Transport by: car         - s/p L TKA,2/26 Per MD patient ready for DC today . RN, patient, and patient's family aware of DC plan. Pt states girlfriend and daughter to assist with care once d/c. Orders noted for home health services. Pt agreeable. Pt without preference. Referral made with [facility] and accepted pending insurance approval. Referral made with [facility]on [facility] / [employer] for [facility]. Equipment will be delivered to beside prior to d/c. Pt without RX med concerns. Post hospital f/u noted on AVS. Family to provide transportation to home. RNCM will sign off for now as intervention is no longer needed. Please consult [place] again if new needs arise.   Final next level of care: [place] Barriers to Discharge: No Barriers Identified   Patient Goals and CMS Choice   Choice offered to / list presented to : Patient  Discharge Placement                         Discharge Plan and Services Additional resources added to the After Visit Summary for                  DME Arranged: Bedside commode DME Agency: [employer] (Pt states already has CPM and RW @ home) Date DME Agency Contacted: 01/10/23 Time DME Agency Contacted: [phone] Representative spoke with at DME Agency: [facility]ills HH Arranged: PT [place]: [place] Date [place]: 01/10/23 Time [place]: [place] Representative spoke with at [place]: [place] ([place]) Interventions SDOH Screenings   Food Insecurity: No Food Insecurity (01/09/2023)  Housing: Low Risk  (01/09/2023)  Transportation Needs: No  Transportation Needs (01/09/2023)  Utilities: Not At Risk (01/09/2023)  Depression (PHQ2-9): Medium Risk (02/25/2022)  Tobacco Use: Medium Risk (01/09/2023)     Readmission Risk Interventions     No data to display

## 2023-01-10 NOTE — Telephone Encounter (Signed)
[employer] forms received. To [employer].

## 2023-01-10 NOTE — Discharge Summary (Signed)
Patient ID: Jennis Botte MRN: [id number] DOB/AGE: 64-26-1962 64 y.o.  Admit date: 01/09/2023 Discharge date: 01/10/2023  Admission Diagnoses:  Principal Problem:   Primary osteoarthritis of left knee Active Problems:   Status post total left knee replacement   Discharge Diagnoses:  Same  Past Medical History:  Diagnosis Date   Allergy    spring   Anemia    Asthma    Diabetes mellitus without complication ([place])    type II   Hyperplastic colon polyp    Hypertension    Obesity     Surgeries: Procedure(s): LEFT TOTAL KNEE ARTHROPLASTY on 01/09/2023   Consultants:   Discharged Condition: Improved  Hospital Course: Timoteo Delorbe is an 62 y.o. male who was admitted 01/09/2023 for operative treatment ofPrimary osteoarthritis of left knee. Patient has severe unremitting pain that affects sleep, daily activities, and work/hobbies. After pre-op clearance the patient was taken to the operating room on 01/09/2023 and underwent  Procedure(s): LEFT TOTAL KNEE ARTHROPLASTY.    Patient was given perioperative antibiotics:  Anti-infectives (From admission, onward)    Start     Dose/Rate Route Frequency Ordered Stop   01/10/23 1000  cefadroxil (DURICEF) capsule 500 mg       Note to Pharmacy: To be taken after surgery     500 mg Oral 2 times daily 01/09/23 1238     01/09/23 1330  ceFAZolin (ANCEF) IVPB 2g/100 mL premix        2 g 200 mL/hr over 30 Minutes Intravenous Every 6 hours 01/09/23 1238 01/09/23 2005   01/09/23 0834  vancomycin (VANCOCIN) powder  Status:  Discontinued          As needed 01/09/23 0839 01/09/23 0908   01/09/23 0600  ceFAZolin (ANCEF) IVPB 2g/100 mL premix        2 g 200 mL/hr over 30 Minutes Intravenous On call to O.R. 01/09/23 0543 01/09/23 [id number]        Patient was given sequential compression devices, early ambulation, and chemoprophylaxis to prevent DVT.  Patient benefited maximally from hospital stay and there were no complications.    Recent  vital signs: Patient Vitals for the past 24 hrs:  BP Temp Temp src Pulse Resp SpO2  01/10/23 0725 (!) 159/90 98 F (36.7 C) Oral 100 15 93 %  01/10/23 0646 (!) 156/96 98.5 F (36.9 C) -- -- 13 95 %  01/09/23 1927 (!) 144/83 98.1 F (36.7 C) -- -- 12 98 %  01/09/23 1239 -- -- -- -- -- 100 %  01/09/23 1237 (!) 154/95 98 F (36.7 C) -- 75 -- 100 %  01/09/23 1215 (!) 146/98 -- -- 82 17 95 %  01/09/23 1145 (!) 137/94 -- -- 71 10 97 %  01/09/23 1041 (!) 130/94 98 F (36.7 C) -- 70 11 97 %  01/09/23 1026 132/88 -- -- 68 16 98 %  01/09/23 1011 129/84 -- -- 67 13 98 %  01/09/23 0956 123/85 -- -- 69 15 95 %     Recent laboratory studies:  Recent Labs    01/10/23 0135  WBC 13.2*  HGB 14.0  HCT 40.4  PLT 347     Discharge Medications:   Allergies as of 01/10/2023   No Known Allergies      Medication List     STOP taking these medications    naproxen sodium 220 MG tablet Commonly known as: ALEVE       TAKE these medications    albuterol 108 (90  Base) MCG/ACT inhaler Commonly known as: VENTOLIN HFA INHALE 2 PUFFS BY MOUTH EVERY 6 HOURS AS NEEDED FOR WHEEZING OR SHORTNESS OF BREATH   amLODipine 10 MG tablet Commonly known as: NORVASC 1 tab po q day   aspirin EC 81 MG tablet Take 1 tablet (81 mg total) by mouth in the morning and at bedtime. To be taken after surgery to prevent blood clots What changed: Another medication with the same name was removed. Continue taking this medication, and follow the directions you see here.   atorvastatin 20 MG tablet Commonly known as: LIPITOR TAKE 1 TABLET BY MOUTH EVERY DAY   buPROPion 150 MG 24 hr tablet Commonly known as: Wellbutrin XL Take 1 tablet (150 mg total) by mouth daily.   cefadroxil 500 MG capsule Commonly known as: DURICEF Take 1 capsule (500 mg total) by mouth 2 (two) times daily. To be taken after surgery   Cinnamon 500 MG Tabs Take 500 mg by mouth daily.   Dialyvite Vitamin D 5000 125 MCG (5000 UT)  capsule Generic drug: Cholecalciferol Take 5,000 Units by mouth every other day.   docusate sodium 100 MG capsule Commonly known as: Colace Take 1 capsule (100 mg total) by mouth daily as needed.   Iron-Vitamin C 65-125 MG Tabs Take by mouth.   Lumify 0.025 % Soln Generic drug: Brimonidine Tartrate Place 1 drop into both eyes daily as needed (redness).   metFORMIN 500 MG tablet Commonly known as: GLUCOPHAGE Take 1 tablet (500 mg total) by mouth 2 (two) times daily with a meal.   methocarbamol 750 MG tablet Commonly known as: Robaxin-750 Take 1 tablet (750 mg total) by mouth 2 (two) times daily as needed for muscle spasms. To be taken after surgery   Multi-Vitamins Tabs Take 1 tablet by mouth daily.   NovoLOG FlexPen 100 UNIT/ML FlexPen Generic drug: insulin aspart 4 units injection with  each meal tid   ondansetron 4 MG tablet Commonly known as: Zofran Take 1 tablet (4 mg total) by mouth every 8 (eight) hours as needed for nausea or vomiting.   oxyCODONE-acetaminophen 5-325 MG tablet Commonly known as: Percocet Take 1-2 tablets by mouth every 6 (six) hours as needed. To be taken after surgery   Ozempic (0.25 or 0.5 MG/DOSE) 2 MG/3ML Sopn Generic drug: Semaglutide(0.25 or 0.'5MG'$ /DOS) Inject 0.'5mg'$  into the skin once weekly   Semaglutide (1 MG/DOSE) 4 MG/3ML Sopn Inject 1 mg as directed once a week.   triamterene-hydrochlorothiazide 37.5-25 MG capsule Commonly known as: DYAZIDE TAKE 1 CAPSULE BY MOUTH EVERY MORNING   TURMERIC PO Take 1,000 mg by mouth daily.               Durable Medical Equipment  (From admission, onward)           Start     Ordered   01/09/23 1239  DME Walker rolling  Once       Question Answer Comment  Walker: With 5 Inch Wheels   Patient needs a walker to treat with the following condition Status post left partial knee replacement      01/09/23 1238   01/09/23 1239  DME 3 n 1  Once        01/09/23 1238   01/09/23 1239  DME  Bedside commode  Once       Question:  Patient needs a bedside commode to treat with the following condition  Answer:  Status post left partial knee replacement   01/09/23 1238  Diagnostic Studies: DG Knee Left Port  Result Date: 01/09/2023 CLINICAL DATA:  Status post left total knee arthroplasty. EXAM: PORTABLE LEFT KNEE - 1-2 VIEW COMPARISON:  September 20, 2022. FINDINGS: The femoral, patellar and tibial components are well situated. Expected postoperative changes are noted in the soft tissues anteriorly. IMPRESSION: Status post left total knee arthroplasty. Electronically Signed   By: Marijo Conception M.D.   On: 01/09/2023 09:45    Disposition:      Follow-up Information     Leandrew Koyanagi, MD. Schedule an appointment as soon as possible for a visit in 2 week(s).   Specialty: Orthopedic Surgery Contact information: [address] [place] [address] [phone]                  Signed: Aundra Dubin 01/10/2023, 9:48 AM

## 2023-01-10 NOTE — Progress Notes (Signed)
Physical Therapy Treatment Patient Details Name: Craig House MRN: [id number] DOB: 17-Jan-1961 Today's Date: 01/10/2023   History of Present Illness 62 y.o. male presents to [facility] on 01/09/2023 for elective L TKA. PMH includes anemia, asthma, DMII, HTN.    PT Comments    Patient progressing well this afternoon; reports some nausea and vomiting earlier which pt reports is not completely resolved. Session focused on stair training with family present. Requires Min A for to negotiate stairs going sideways with BUEs on handrail on right. Provided gait belt for assist as well. Problem solved how to navigate higher bed at home recommending using step stool to safely enter bed. Encouraged hooking RLE under LLE to assist with bringing LEs into bed as well vs using belt/sheet. Reviewed positioning and importance of mobility/there ex as well as cryotherapy. Safe to d/c from a mobility stand point with assist from family. Will follow if still in the hospital.   Recommendations for follow up therapy are one component of a multi-disciplinary discharge planning process, led by the attending physician.  Recommendations may be updated based on patient status, additional functional criteria and insurance authorization.  Follow Up Recommendations  Follow physician's recommendations for discharge plan and follow up therapies     Assistance Recommended at Discharge Intermittent Supervision/Assistance  Patient can return home with the following A little help with bathing/dressing/bathroom;Help with stairs or ramp for entrance;Assist for transportation   Equipment Recommendations  None recommended by PT    Recommendations for Other Services       Precautions / Restrictions Precautions Precautions: Fall;Knee Precaution Booklet Issued: No Restrictions Weight Bearing Restrictions: Yes LLE Weight Bearing: Weight bearing as tolerated     Mobility  Bed Mobility Overal bed mobility: Needs  Assistance Bed Mobility: Supine to Sit, Sit to Supine     Supine to sit: Modified independent (Device/Increase time), HOB elevated Sit to supine: Modified independent (Device/Increase time), HOB elevated   General bed mobility comments: Cues to hook RLE under LLE to bring into bed, use of UEs to bring LLE to EOB. Pt has high bed at home so instructed pt to use step stool with rail to get into bed    Transfers Overall transfer level: Needs assistance Equipment used: Rolling walker (2 wheels) Transfers: Sit to/from Stand Sit to Stand: Min guard           General transfer comment: Min guard for safety. Stood from chair x2, from [employer], cues for hand placement.    Ambulation/Gait Ambulation/Gait assistance: Min guard Gait Distance (Feet): 30 Feet (+ 18' + 10') Assistive device: Rolling walker (2 wheels) Gait Pattern/deviations: Step-through pattern, Decreased stance time - left, Decreased step length - right, Trunk flexed, Knee flexed in stance - left, Step-to pattern Gait velocity: reduced     General Gait Details: Slow, step to vs hop to gait due to pain in LLE, cues to place more weight through LLE esp during stance phase. HR up to 130s bpm.   Stairs Stairs: Yes Stairs assistance: Min assist Stair Management: Step to pattern, Sideways, Forwards, One rail Right Number of Stairs: 4 General stair comments: BUE on rails to go sideways with cues for technique, Min A for balance. Daughter present for stair training, provided cues and gait belt.   Wheelchair Mobility    Modified Rankin (Stroke Patients Only)       Balance Overall balance assessment: Needs assistance Sitting-balance support: Feet supported, No upper extremity supported Sitting balance-Leahy Scale: Good  Standing balance support: During functional activity, Bilateral upper extremity supported Standing balance-Leahy Scale: Poor Standing balance comment: Requires UE support in standing.                             Cognition Arousal/Alertness: Awake/alert Behavior During Therapy: WFL for tasks assessed/performed Overall Cognitive Status: Within Functional Limits for tasks assessed                                 General Comments: Sleepy        Exercises Total Joint Exercises Ankle Circles/Pumps: AROM, Both, 10 reps, Seated Quad Sets: AROM, Both, 10 reps, Seated Goniometric ROM: 8-75 degrees knee AROM    General Comments General comments (skin integrity, edema, etc.): educated on importance of positioning for knee and terminal knee extension      Pertinent Vitals/Pain Pain Assessment Pain Assessment: Faces Pain Score: 8  Faces Pain Scale: Hurts even more Pain Location: L knee with mobility Pain Descriptors / Indicators: Sore, Operative site guarding, Guarding, Grimacing Pain Intervention(s): Monitored during session, Premedicated before session, Limited activity within patient's tolerance, Repositioned    Home Living Family/patient expects to be discharged to:: Private residence Living Arrangements: Spouse/significant other Available Help at Discharge: Family;Available 24 hours/day Type of Home: House Home Access: Stairs to enter Entrance Stairs-Rails: Can reach both Entrance Stairs-Number of Steps: 5 Alternate Level Stairs-Number of Steps: 7 Home Layout: Two level Home Equipment: Conservation officer, nature (2 wheels);Crutches;BSC/3in1;Shower seat      Prior Function            PT Goals (current goals can now be found in the care plan section) Progress towards PT goals: Progressing toward goals    Frequency    7X/week      PT Plan Current plan remains appropriate    Co-evaluation              AM-PAC PT "6 Clicks" Mobility   Outcome Measure  Help needed turning from your back to your side while in a flat bed without using bedrails?: A Little Help needed moving from lying on your back to sitting on the side of a flat bed without  using bedrails?: A Little Help needed moving to and from a bed to a chair (including a wheelchair)?: A Little Help needed standing up from a chair using your arms (e.g., wheelchair or bedside chair)?: A Little Help needed to walk in hospital room?: A Little Help needed climbing 3-5 steps with a railing? : A Little 6 Click Score: 18    End of Session Equipment Utilized During Treatment: Gait belt Activity Tolerance: Patient limited by pain;Other (comment) (nausea) Patient left: in bed;with call bell/phone within reach;with family/visitor present Nurse Communication: Mobility status PT Visit Diagnosis: Other abnormalities of gait and mobility (R26.89);Muscle weakness (generalized) (M62.81);Pain Pain - Right/Left: Left Pain - part of body: Knee     Time: [id number] PT Time Calculation (min) (ACUTE ONLY): 28 min  Charges:  $Gait Training: 23-37 mins                     Marisa Severin, PT, DPT Acute Rehabilitation Services Secure chat preferred Office [place] 01/10/2023, 2:58 PM

## 2023-01-10 NOTE — Discharge Summary (Incomplete)
Patient discharge given and reviewed with patient, pts daughter, and girlfriend. IV access taken out. Patient getting dressed and will be transported to lobby for transportion home via family.

## 2023-01-10 NOTE — Progress Notes (Addendum)
Subjective: 1 Day Post-Op Procedure(s) (LRB): LEFT TOTAL KNEE ARTHROPLASTY (Left) Patient reports pain as moderate.  Pain relatively controlled with narcotics.  Has been having nausea/vomiting this am.   Objective: Vital signs in last 24 hours: Temp:  [98 F (36.7 C)-98.5 F (36.9 C)] 98 F (36.7 C) (02/27 0725) Pulse Rate:  [67-100] 100 (02/27 0725) Resp:  [10-17] 15 (02/27 0725) BP: (123-159)/(83-98) 159/90 (02/27 0725) SpO2:  [93 %-100 %] 93 % (02/27 0725)  Intake/Output from previous day: 02/26 0701 - 02/27 0700 In: 2020 [P.O.:120; I.V.:1900] Out: 3700 [Urine:3650; Blood:50] Intake/Output this shift: No intake/output data recorded.  Recent Labs    01/10/23 0135  HGB 14.0   Recent Labs    01/10/23 0135  WBC 13.2*  RBC 4.29  HCT 40.4  PLT 347   No results for input(s): "NA", "K", "CL", "CO2", "BUN", "CREATININE", "GLUCOSE", "CALCIUM" in the last 72 hours. No results for input(s): "LABPT", "INR" in the last 72 hours.  Neurologically intact Neurovascular intact Sensation intact distally Intact pulses distally Dorsiflexion/Plantar flexion intact Incision: scant drainage No cellulitis present Compartment soft   Assessment/Plan: 1 Day Post-Op Procedure(s) (LRB): LEFT TOTAL KNEE ARTHROPLASTY (Left) Advance diet Up with therapy WBAT LLE Continue iv and po fluids May d/c home with HHPT after second PT session if cleared by PT AND nausea/vomiting resolves.  Please call office for d/c order        Aundra Dubin 01/10/2023, 9:44 AM

## 2023-01-11 ENCOUNTER — Telehealth: Payer: Self-pay

## 2023-01-11 NOTE — Transitions of Care (Post Inpatient/ED Visit) (Unsigned)
   01/11/2023  Name: Craig House MRN: [id number] DOB: 04/12/1961  Today's TOC FU Call Status: Today's TOC FU Call Status:: Unsuccessul Call (1st Attempt) Unsuccessful Call (1st Attempt) Date: 01/11/23  Attempted to reach the patient regarding the most recent Inpatient/ED visit.  Follow Up Plan: Additional outreach attempts will be made to reach the patient to complete the Transitions of Care (Post Inpatient/ED visit) call.   Signature Juanda Crumble, [place] Direct Dial [phone]

## 2023-01-12 NOTE — Transitions of Care (Post Inpatient/ED Visit) (Signed)
   01/12/2023  Name: Craig House MRN: [id number] DOB: June 29, 1961  Today's TOC FU Call Status: Today's TOC FU Call Status:: Unsuccessful Call (2nd Attempt) Unsuccessful Call (1st Attempt) Date: 01/11/23 Unsuccessful Call (2nd Attempt) Date: 01/12/23  Attempted to reach the patient regarding the most recent Inpatient/ED visit.  Follow Up Plan: No further outreach attempts will be made at this time. We have been unable to contact the patient.  Signature Juanda Crumble, [place] Direct Dial [phone]

## 2023-01-18 ENCOUNTER — Telehealth: Payer: Self-pay | Admitting: Orthopaedic Surgery

## 2023-01-18 ENCOUNTER — Other Ambulatory Visit: Payer: Self-pay | Admitting: Physician Assistant

## 2023-01-18 MED ORDER — OXYCODONE-ACETAMINOPHEN 7.5-325 MG PO TABS: 1.0000 | ORAL_TABLET | Freq: Four times a day (QID) | ORAL | 0 refills | Status: DC | PRN

## 2023-01-18 NOTE — Telephone Encounter (Signed)
Sent in percocet 7.5

## 2023-01-18 NOTE — Telephone Encounter (Signed)
Patient states he need a stronger pain medication and wants someone to call him

## 2023-01-18 NOTE — Telephone Encounter (Signed)
Notified patient.

## 2023-01-24 ENCOUNTER — Encounter: Payer: Self-pay | Admitting: Physical Therapy

## 2023-01-24 ENCOUNTER — Ambulatory Visit: Payer: Managed Care, Other (non HMO) | Admitting: Physical Therapy

## 2023-01-24 ENCOUNTER — Other Ambulatory Visit: Payer: Self-pay

## 2023-01-24 ENCOUNTER — Ambulatory Visit: Payer: Managed Care, Other (non HMO) | Admitting: Physician Assistant

## 2023-01-24 DIAGNOSIS — M25562 Pain in left knee: Secondary | ICD-10-CM

## 2023-01-24 DIAGNOSIS — M25662 Stiffness of left knee, not elsewhere classified: Secondary | ICD-10-CM | POA: Diagnosis not present

## 2023-01-24 DIAGNOSIS — R262 Difficulty in walking, not elsewhere classified: Secondary | ICD-10-CM

## 2023-01-24 DIAGNOSIS — R600 Localized edema: Secondary | ICD-10-CM

## 2023-01-24 DIAGNOSIS — Z96652 Presence of left artificial knee joint: Secondary | ICD-10-CM

## 2023-01-24 MED ORDER — OXYCODONE-ACETAMINOPHEN 7.5-325 MG PO TABS: ORAL_TABLET | ORAL | 0 refills | Status: DC

## 2023-01-24 MED ORDER — METHOCARBAMOL 750 MG PO TABS: 750.0000 mg | ORAL_TABLET | Freq: Four times a day (QID) | ORAL | 2 refills | Status: DC

## 2023-01-24 NOTE — Progress Notes (Signed)
Post-Op Visit Note   Patient: Craig House           Date of Birth: 06-08-1961           MRN: [id number] Visit Date: 01/24/2023 PCP: Mackie Pai, PA-C   Assessment & Plan:  Chief Complaint:  Chief Complaint  Patient presents with   Left Knee - Routine Post Op   Visit Diagnoses:  1. Status post total left knee replacement     Plan: Patient is a pleasant 62 year old gentleman who comes in 2 weeks status post left total knee replacement 01/09/2023.  He has been doing well with the exception of pain.  He has been struggling trying to get this under control although it has improved once switching to Percocet 7.5.  He has been compliant taking a baby aspirin twice daily for DVT prophylaxis.  He is getting home health physical therapy and is ambulating with a walker.  He has outpatient physical therapy scheduled after our visit today.  Examination of the left knee reveals a well-healed surgical incision with nylon sutures in place.  No evidence of infection or cellulitis.  Calves are soft nontender.  He is neurovascularly intact distally.  Today, sutures were removed and Steri-Strips applied.  I have refilled his Percocet as well this is Robaxin.  He will continue with physical therapy.  Dental prophylaxis reinforced.  Follow-up with 4 weeks for repeat evaluation and 2 view x-rays of the left knee.  Call with concerns or questions.  Follow-Up Instructions: Return in about 4 weeks (around 02/21/2023).   Orders:  No orders of the defined types were placed in this encounter.  Meds ordered this encounter  Medications   oxyCODONE-acetaminophen (PERCOCET) 7.5-325 MG tablet    Sig: Take 1-2 tabs po every 6-8 hours prn pain    Dispense:  40 tablet    Refill:  0   methocarbamol (ROBAXIN-750) 750 MG tablet    Sig: Take 1 tablet (750 mg total) by mouth 4 (four) times daily.    Dispense:  40 tablet    Refill:  2    Imaging: No new imaging  PMFS History: Patient Active Problem List    Diagnosis Date Noted   Primary osteoarthritis of left knee 01/09/2023   Status post total left knee replacement 01/09/2023   Bilateral primary osteoarthritis of knee 06/19/2018   Mild intermittent asthma, uncomplicated 123456   HTN (hypertension) 12/31/2011   Past Medical History:  Diagnosis Date   Allergy    spring   Anemia    Asthma    Diabetes mellitus without complication ([place])    type II   Hyperplastic colon polyp    Hypertension    Obesity     Family History  Problem Relation Age of Onset   Heart attack Mother    Cancer Father    Heart disease Father    Colon cancer Neg Hx     Past Surgical History:  Procedure Laterality Date   KNEE ARTHROSCOPY Bilateral    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   Procedure: LEFT TOTAL KNEE ARTHROPLASTY;  Surgeon: Leandrew Koyanagi, MD;  Location: [facility];  Service: Orthopedics;  Laterality: Left;   Social History   Occupational History   Not on file  Tobacco Use   Smoking status: Former   Smokeless tobacco: Never  Vaping Use   Vaping Use: Never used  Substance and Sexual Activity   Alcohol use: Yes    Alcohol/week: 1.0 standard drink of alcohol  Types: 1 Standard drinks or equivalent per week    Comment: 1 glass of red wine at night   Drug use: No   Sexual activity: Yes

## 2023-01-24 NOTE — Therapy (Signed)
OUTPATIENT PHYSICAL THERAPY LOWER EXTREMITY EVALUATION   Patient Name: Craig House MRN: [id number] DOB:1961-08-30, 62 y.o., male Today's Date: 01/24/2023  END OF SESSION:  PT End of Session - 01/24/23 1257     Visit Number 1    Number of Visits 21    Date for PT Re-Evaluation 03/21/23    Authorization Type [employer]    Authorization - Number of Visits 62    PT Start Time 1148    PT Stop Time 1226    PT Time Calculation (min) 38 min    Activity Tolerance Patient tolerated treatment well    Behavior During Therapy [facility] for tasks assessed/performed             Past Medical History:  Diagnosis Date   Allergy    spring   Anemia    Asthma    Diabetes mellitus without complication ([place])    type II   Hyperplastic colon polyp    Hypertension    Obesity    Past Surgical History:  Procedure Laterality Date   KNEE ARTHROSCOPY Bilateral    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   Procedure: LEFT TOTAL KNEE ARTHROPLASTY;  Surgeon: Leandrew Koyanagi, MD;  Location: [facility];  Service: Orthopedics;  Laterality: Left;   Patient Active Problem List   Diagnosis Date Noted   Primary osteoarthritis of left knee 01/09/2023   Status post total left knee replacement 01/09/2023   Bilateral primary osteoarthritis of knee 06/19/2018   Mild intermittent asthma, uncomplicated 123456   HTN (hypertension) 12/31/2011    PCP: Elise Benne   REFERRING PROVIDER: Leandrew Koyanagi, MD  REFERRING DIAG: [phone] (ICD-10-CM) - Primary osteoarthritis of left knee  THERAPY DIAG:  Acute pain of left knee  Stiffness of left knee, not elsewhere classified  Difficulty in walking, not elsewhere classified  Localized edema  Rationale for Evaluation and Treatment: Rehabilitation  ONSET DATE: 01/05/2023  SUBJECTIVE:   SUBJECTIVE STATEMENT:  Knee is feeling sore and throbbing. Have been able to move around, I'm able to do what I need to do though. Stairs are still difficult. Balance is  better but still off. DCed from [place] yesterday.   PERTINENT HISTORY: Hospital Course: Craig House is an 62 y.o. male who was admitted 01/09/2023 for operative treatment ofPrimary osteoarthritis of left knee. Patient has severe unremitting pain that affects sleep, daily activities, and work/hobbies. After pre-op clearance the patient was taken to the operating room on 01/09/2023 and underwent  Procedure(s): LEFT TOTAL KNEE ARTHROPLASTY.   PAIN:  Are you having pain? Yes: NPRS scale: 5/10 Pain location: L knee  Pain description: aching throb  Aggravating factors: movement  Relieving factors: staying still, rest   PRECAUTIONS: Fall  WEIGHT BEARING RESTRICTIONS: No  FALLS:  Has patient fallen in last 6 months? 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   PLOF: Independent, Independent with basic ADLs, Independent with gait, and Independent with transfers  PATIENT GOALS: get movement and mobility back   NEXT MD VISIT: Referring in 4 weeks   OBJECTIVE:   DIAGNOSTIC FINDINGS:   PATIENT SURVEYS:  FOTO 74  COGNITION: Overall cognitive status: Within functional limits for tasks  assessed     SENSATION: Not tested  EDEMA:   Appropriate for post-op state   MUSCLE LENGTH:    POSTURE:   PALPATION:   LOWER EXTREMITY ROM:  Active ROM Right eval Left eval  Hip flexion    Hip extension    Hip abduction    Hip adduction    Hip internal rotation    Hip external rotation    Knee flexion  73*  Knee extension  15*  Ankle dorsiflexion    Ankle plantarflexion    Ankle inversion    Ankle eversion     (Blank rows = not tested)  LOWER EXTREMITY MMT:  MMT  Right eval Left eval  Hip flexion 4+ 3+ pain limited   Hip extension    Hip abduction    Hip adduction    Hip internal rotation    Hip external rotation    Knee flexion 4- 3+  Knee extension 5 4-  Ankle dorsiflexion 5 5  Ankle plantarflexion    Ankle inversion    Ankle eversion     (Blank rows = not tested)  LOWER EXTREMITY SPECIAL TESTS:    FUNCTIONAL TESTS:  Timed up and go (TUG): 23 seconds RW  3 minute walk test: 334f with RW   GAIT: Distance walked: 3284f Assistive device utilized: WaEnvironmental consultant 2 wheeled Level of assistance: Modified independence Comments: flexed at hips, antalgic with less wt shift to left, limited ROM in L knee in stance and swing phases    TODAY'S TREATMENT:                                                                                                                              DATE:   Eval  Objective measures + appropriate education  TherEx  SciFit bike seat 10 full rotations x6 minutes (but hip compensations present)      PATIENT EDUCATION:  Education details: POC, HEP, exam findings, edema management, general progression through PT after TKR, elevation strategies at home Person educated: Patient and significant other  Education method: Explanation, Demonstration, and Handouts Education comprehension: verbalized understanding, returned demonstration, and needs further education  HOME EXERCISE PROGRAM:  Will give next session   ASSESSMENT:  CLINICAL IMPRESSION: Patient is a [age].o. M who was seen today for physical therapy evaluation and treatment for skilled PT care . Of note has had multiple knee surgeries in the past and had significantly limited ROM even before TKR surgery.  Exam reveals significant ROM limitations, poor functional muscle strength, gait impairment, and limited mobility and balance. Will benefit from skilled PT services to address all limitations and assist in return to optimal level of function.   OBJECTIVE  IMPAIRMENTS: Abnormal gait, decreased activity tolerance, decreased balance, decreased knowledge of use of DME, decreased mobility, difficulty walking, decreased ROM, decreased strength, hypomobility, increased edema, increased fascial restrictions, impaired flexibility, and pain.   ACTIVITY LIMITATIONS: standing, squatting, stairs, transfers, and locomotion level  PARTICIPATION LIMITATIONS: driving, shopping, community activity, occupation, and yard work  PERSONAL FACTORS: Age, Behavior pattern, Education, Fitness, Past/current experiences, and Time since onset of injury/illness/exacerbation are also affecting patient's functional outcome.   REHAB POTENTIAL: Good  CLINICAL DECISION MAKING: Stable/uncomplicated  EVALUATION COMPLEXITY: Low   GOALS: Goals reviewed with patient? Yes  SHORT TERM GOALS: Target date: 02/21/2023   Will be compliant with progressive appropriate HEP  Baseline: Goal status: INITIAL  2.  L knee extension AROM to be no more than 5* and flexion AROM to be at least 90* Baseline:  Goal status: INITIAL  3.  Will be able to ambulate in home distances with LRAD without significant gait deviation Baseline:  Goal status: INITIAL  4.  Will demonstrate equal weight bearing BLEs with functional transfers and no offshift from L LE  Baseline:  Goal status: INITIAL    LONG TERM GOALS: Target date: 03/21/2023    MMT to have improved by at least 1 grade in all weak groups  Baseline:  Goal status: INITIAL  2.  L knee flexion AROM to be at least 105*  Baseline:  Goal status: INITIAL  3.  Will be able to ambulate at least 468f with LRAD in 3MWT to show improved functional mobility  Baseline:  Goal status: INITIAL  4.  Will be able to complete TUG in 13 seconds or less with LRAD in order to show improved functional mobility and balance  Baseline:  Goal status: INITIAL  5.  Will be able to reciprocal ascend/descend 7 steps with no increase in pain and U rail to  improve home and community access  Baseline:  Goal status: INITIAL    PLAN:  PT FREQUENCY:  3x/week for first 4 weeks, then 2x/week for next 4 weeks   PT DURATION: 8 weeks  PLANNED INTERVENTIONS: Therapeutic exercises, Therapeutic activity, Neuromuscular re-education, Balance training, Gait training, Patient/Family education, Self Care, Joint mobilization, Stair training, DME instructions, Aquatic Therapy, Dry Needling, Electrical stimulation, Cryotherapy, Moist heat, Taping, Vasopneumatic device, Ultrasound, Ionotophoresis '4mg'$ /ml Dexamethasone, Manual therapy, and Re-evaluation  PLAN FOR NEXT SESSION: needs updates to HEP, otherwise typical care for TKR including ROM and strength, may benefit from manual heavy approach   KDeniece ReePT DPT PN2

## 2023-01-30 ENCOUNTER — Encounter: Payer: Self-pay | Admitting: Physical Therapy

## 2023-01-30 ENCOUNTER — Ambulatory Visit: Payer: Managed Care, Other (non HMO) | Admitting: Physical Therapy

## 2023-01-30 ENCOUNTER — Telehealth: Payer: Self-pay | Admitting: Orthopaedic Surgery

## 2023-01-30 DIAGNOSIS — M25562 Pain in left knee: Secondary | ICD-10-CM | POA: Diagnosis not present

## 2023-01-30 DIAGNOSIS — M25662 Stiffness of left knee, not elsewhere classified: Secondary | ICD-10-CM

## 2023-01-30 DIAGNOSIS — R600 Localized edema: Secondary | ICD-10-CM | POA: Diagnosis not present

## 2023-01-30 DIAGNOSIS — R262 Difficulty in walking, not elsewhere classified: Secondary | ICD-10-CM | POA: Diagnosis not present

## 2023-01-30 NOTE — Telephone Encounter (Signed)
[employer] forms received. To [employer]. 

## 2023-01-30 NOTE — Therapy (Signed)
OUTPATIENT PHYSICAL THERAPY LOWER EXTREMITY  TREATMENT NOTE   Patient Name: Craig House MRN: [id number] DOB:07-27-1961, 62 y.o., male Today's Date: 01/30/2023  END OF SESSION:  PT End of Session - 01/30/23 1525     Visit Number 2    Number of Visits 21    Date for PT Re-Evaluation 03/21/23    Authorization Type [employer]    Authorization - Number of Visits 24    PT Start Time 1518    PT Stop Time 1604    PT Time Calculation (min) 46 min    Activity Tolerance Patient tolerated treatment well    Behavior During Therapy [facility] for tasks assessed/performed              Past Medical History:  Diagnosis Date   Allergy    spring   Anemia    Asthma    Diabetes mellitus without complication ([place])    type II   Hyperplastic colon polyp    Hypertension    Obesity    Past Surgical History:  Procedure Laterality Date   KNEE ARTHROSCOPY Bilateral    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   Procedure: LEFT TOTAL KNEE ARTHROPLASTY;  Surgeon: Leandrew Koyanagi, MD;  Location: [facility];  Service: Orthopedics;  Laterality: Left;   Patient Active Problem List   Diagnosis Date Noted   Primary osteoarthritis of left knee 01/09/2023   Status post total left knee replacement 01/09/2023   Bilateral primary osteoarthritis of knee 06/19/2018   Mild intermittent asthma, uncomplicated 123456   HTN (hypertension) 12/31/2011    PCP: Elise Benne   REFERRING PROVIDER: Leandrew Koyanagi, MD  REFERRING DIAG: [phone] (ICD-10-CM) - Primary osteoarthritis of left knee  THERAPY DIAG:  Acute pain of left knee  Stiffness of left knee, not elsewhere classified  Difficulty in walking, not elsewhere classified  Localized edema  Rationale for Evaluation and Treatment: Rehabilitation  ONSET DATE: 01/05/2023  SUBJECTIVE:   SUBJECTIVE STATEMENT: Pt arriving today reporting 6/10 in his left knee. Pt stating he has cranked his CPM up to 95 degrees at home. Pt stating he has been doing  his HEP where some days are better than others.      PERTINENT HISTORY: Hospital Course: Craig House is an 62 y.o. male who was admitted 01/09/2023 for operative treatment ofPrimary osteoarthritis of left knee. Patient has severe unremitting pain that affects sleep, daily activities, and work/hobbies. After pre-op clearance the patient was taken to the operating room on 01/09/2023 and underwent  Procedure(s): LEFT TOTAL KNEE ARTHROPLASTY.   PAIN:  Are you having pain? Yes: NPRS scale: 6/10 Pain location: L knee  Pain description: aching throb, stiffness  Aggravating factors: movement  Relieving factors: staying still, rest   PRECAUTIONS: Fall  WEIGHT BEARING RESTRICTIONS: No  FALLS:  Has patient fallen in last 6 months? 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   PLOF: Independent, [place] with basic ADLs, Independent with gait, and Independent with transfers  PATIENT GOALS: get movement and mobility back   NEXT MD VISIT: Referring in 4 weeks   OBJECTIVE:   DIAGNOSTIC FINDINGS:   PATIENT SURVEYS:  FOTO 16  COGNITION: Overall  cognitive status: Within functional limits for tasks assessed     SENSATION: Not tested  EDEMA:   Appropriate for post-op state   MUSCLE LENGTH:    POSTURE:   PALPATION:   LOWER EXTREMITY ROM:  Active ROM Right eval Left eval Left 01/30/23  Hip flexion     Hip extension     Hip abduction     Hip adduction     Hip internal rotation     Hip external rotation     Knee flexion  73* A: 80 P: 85  Knee extension  15* A: 12 P: 10  Ankle dorsiflexion     Ankle plantarflexion     Ankle inversion     Ankle eversion       (Blank rows = not tested)  LOWER EXTREMITY MMT:  MMT Right eval Left eval  Hip flexion 4+ 3+ pain limited   Hip extension    Hip abduction    Hip adduction    Hip internal rotation    Hip external rotation    Knee flexion 4- 3+  Knee extension 5 4-  Ankle dorsiflexion 5 5  Ankle plantarflexion    Ankle inversion    Ankle eversion     (Blank rows = not tested)  LOWER EXTREMITY SPECIAL TESTS:    FUNCTIONAL TESTS:  Eval:  Timed up and go (TUG): 23 seconds RW  3 minute walk test: 398ft with RW   GAIT: Distance walked: 379ft  Assistive device utilized: Environmental consultant - 2 wheeled Level of assistance: Modified independence Comments: flexed at hips, antalgic with less wt shift to left, limited ROM in L knee in stance and swing phases    TODAY'S TREATMENT:                                                                                                                              DATE:  01/30/23:  TherEx:  Nustep: level 5 x 7 minutes  Calf stretch on slant board: 3 x 30 seconds Heel raises: x 15 c UE support Step ups on 6 inch step x 10 c single UE support Leg Press bil LE's:  50# 2 x 10, Left LE only 31 # x 10  Seated SLR: x 10  Seated LAQ: x 10  Heel slides x 10 Manual:  Seated knee flexion PROM with over pressures Overpressure with extension PROM Modalities:  Vasopneumatic: 34 deg, c 10 minutes, med compression    Eval  Objective measures + appropriate education  TherEx  SciFit bike seat 10 full rotations x6 minutes (but hip compensations present)      PATIENT EDUCATION:  Education details: POC, HEP, exam findings, edema management, general progression through PT after TKR, elevation strategies at home Person educated: Patient and significant other  Education method: Explanation, Demonstration, and Handouts Education comprehension: verbalized understanding, returned demonstration, and needs further education  HOME EXERCISE PROGRAM: Access Code:  [email or web address] URL: [email or web address] Date: 01/30/2023 Prepared by:  Kearney Hard  Exercises - Seated Long Arc Quad  - 4-5 x daily - 7 x weekly - 2 sets - 10 reps - 3-5 sec hold - Seated Small Alternating Straight Leg Lifts with Heel Touch  - 4-5 x daily - 7 x weekly - 2 sets - 10 reps - Supine Heel Slide with Strap  - 4-5 x daily - 7 x weekly - 10 reps - 5 seconds hold - Sit to Stand with Counter Support  - 4-5 x daily - 7 x weekly - 2 sets - 10 reps - Heel Raises with Counter Support  - 4-5 x daily - 7 x weekly - 2 sets - 10 reps   ASSESSMENT:  CLINICAL IMPRESSION: Pt arriving today with 6/10 pain in his left knee. Pt tolerating exercises well. We discussed sleep positioning with elevation. Pt's HEP was updated and handout issued. Continue skilled PT interventions to maximize pt's function.   OBJECTIVE IMPAIRMENTS: Abnormal gait, decreased activity tolerance, decreased balance, decreased knowledge of use of DME, decreased mobility, difficulty walking, decreased ROM, decreased strength, hypomobility, increased edema, increased fascial restrictions, impaired flexibility, and pain.   ACTIVITY LIMITATIONS: standing, squatting, stairs, transfers, and locomotion level  PARTICIPATION LIMITATIONS: driving, shopping, community activity, occupation, and yard work  PERSONAL FACTORS: Age, Behavior pattern, Education, Fitness, Past/current experiences, and Time since onset of injury/illness/exacerbation are also affecting patient's functional outcome.   REHAB POTENTIAL: Good  CLINICAL DECISION MAKING: Stable/uncomplicated  EVALUATION COMPLEXITY: Low   GOALS: Goals reviewed with patient? Yes  SHORT TERM GOALS: Target date: 02/21/2023   Will be compliant with progressive appropriate HEP  Baseline: Goal status: On-going 01/30/23  2.  L knee extension AROM to be no more than 5* and flexion AROM to be at least 90* Baseline:  Goal status: INITIAL  3.  Will be able to ambulate  in home distances with LRAD without significant gait deviation Baseline:  Goal status: INITIAL  4.  Will demonstrate equal weight bearing BLEs with functional transfers and no offshift from L LE  Baseline:  Goal status: INITIAL    LONG TERM GOALS: Target date: 03/21/2023    MMT to have improved by at least 1 grade in all weak groups  Baseline:  Goal status: INITIAL  2.  L knee flexion AROM to be at least 105*  Baseline:  Goal status: INITIAL  3.  Will be able to ambulate at least 435ft with LRAD in 3MWT to show improved functional mobility  Baseline:  Goal status: INITIAL  4.  Will be able to complete TUG in 13 seconds or less with LRAD in order to show improved functional mobility and balance  Baseline:  Goal status: INITIAL  5.  Will be able to reciprocal ascend/descend 7 steps with no increase in pain and U rail to improve home and community access  Baseline:  Goal status: INITIAL    PLAN:  PT FREQUENCY:  3x/week for first 4 weeks, then 2x/week for next 4 weeks   PT DURATION: 8 weeks  PLANNED INTERVENTIONS: Therapeutic exercises, Therapeutic activity, Neuromuscular re-education, Balance training, Gait training, Patient/Family education, Self Care, Joint mobilization, Stair training, DME instructions, Aquatic Therapy, Dry Needling, Electrical stimulation, Cryotherapy, Moist heat, Taping, Vasopneumatic device, Ultrasound, Ionotophoresis 4mg /ml Dexamethasone, Manual therapy, and Re-evaluation  PLAN FOR NEXT SESSION: otherwise typical care for TKR including ROM and strength, may benefit from manual heavy approach    Kearney Hard, PT, MPT 01/30/23 3:26 PM

## 2023-02-01 ENCOUNTER — Ambulatory Visit: Payer: Managed Care, Other (non HMO) | Admitting: Physical Therapy

## 2023-02-01 ENCOUNTER — Encounter: Payer: Self-pay | Admitting: Physical Therapy

## 2023-02-01 DIAGNOSIS — R600 Localized edema: Secondary | ICD-10-CM | POA: Diagnosis not present

## 2023-02-01 DIAGNOSIS — M25562 Pain in left knee: Secondary | ICD-10-CM | POA: Diagnosis not present

## 2023-02-01 DIAGNOSIS — M25662 Stiffness of left knee, not elsewhere classified: Secondary | ICD-10-CM | POA: Diagnosis not present

## 2023-02-01 DIAGNOSIS — R262 Difficulty in walking, not elsewhere classified: Secondary | ICD-10-CM

## 2023-02-01 NOTE — Therapy (Signed)
OUTPATIENT PHYSICAL THERAPY LOWER EXTREMITY  TREATMENT NOTE   Patient Name: Craig House MRN: [id number] DOB:1961/06/10, 62 y.o., male Today's Date: 02/01/2023  END OF SESSION:  PT End of Session - 02/01/23 0918     Visit Number 3    Number of Visits 21    Date for PT Re-Evaluation 03/21/23    Authorization Type [employer]    Authorization - Number of Visits 69    PT Start Time 0920    PT Stop Time 1005    PT Time Calculation (min) 45 min    Activity Tolerance Patient tolerated treatment well    Behavior During Therapy [facility] for tasks assessed/performed              Past Medical History:  Diagnosis Date   Allergy    spring   Anemia    Asthma    Diabetes mellitus without complication ([place])    type II   Hyperplastic colon polyp    Hypertension    Obesity    Past Surgical History:  Procedure Laterality Date   KNEE ARTHROSCOPY Bilateral    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   Procedure: LEFT TOTAL KNEE ARTHROPLASTY;  Surgeon: Leandrew Koyanagi, MD;  Location: [facility];  Service: Orthopedics;  Laterality: Left;   Patient Active Problem List   Diagnosis Date Noted   Primary osteoarthritis of left knee 01/09/2023   Status post total left knee replacement 01/09/2023   Bilateral primary osteoarthritis of knee 06/19/2018   Mild intermittent asthma, uncomplicated 123456   HTN (hypertension) 12/31/2011    PCP: Elise Benne   REFERRING PROVIDER: Leandrew Koyanagi, MD  REFERRING DIAG: [phone] (ICD-10-CM) - Primary osteoarthritis of left knee  THERAPY DIAG:  Acute pain of left knee  Stiffness of left knee, not elsewhere classified  Difficulty in walking, not elsewhere classified  Localized edema  Rationale for Evaluation and Treatment: Rehabilitation  ONSET DATE: 01/05/2023  SUBJECTIVE:   SUBJECTIVE STATEMENT: His knee is doing ok today, he has been doing his HEP and using CPM     PERTINENT HISTORY: Hospital Course: Craig House is an 62  y.o. male who was admitted 01/09/2023 for operative treatment ofPrimary osteoarthritis of left knee. Patient has severe unremitting pain that affects sleep, daily activities, and work/hobbies. After pre-op clearance the patient was taken to the operating room on 01/09/2023 and underwent  Procedure(s): LEFT TOTAL KNEE ARTHROPLASTY.   PAIN:  Are you having pain? Yes: NPRS scale: 4/10 Pain location: L knee  Pain description: aching throb, stiffness  Aggravating factors: movement  Relieving factors: staying still, rest   PRECAUTIONS: Fall  WEIGHT BEARING RESTRICTIONS: No  FALLS:  Has patient fallen in last 6 months? 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   PLOF: Independent, Independent with basic ADLs, Independent with gait, and Independent with transfers  PATIENT GOALS: get movement and mobility back   NEXT MD VISIT: Referring in 4 weeks   OBJECTIVE:   DIAGNOSTIC FINDINGS:   PATIENT SURVEYS:  FOTO 63  COGNITION: Overall cognitive status: Within functional limits for tasks assessed     SENSATION: Not tested  EDEMA:   Appropriate for post-op state  MUSCLE LENGTH:    POSTURE:   PALPATION:   LOWER EXTREMITY ROM:  Active ROM Right eval Left eval Left 01/30/23  Hip flexion     Hip extension     Hip abduction     Hip adduction     Hip internal rotation     Hip external rotation     Knee flexion  73* A: 80 P: 85  Knee extension  15* A: 12 P: 10  Ankle dorsiflexion     Ankle plantarflexion     Ankle inversion     Ankle eversion      (Blank rows = not tested)  LOWER EXTREMITY MMT:  MMT Right eval Left eval  Hip flexion 4+ 3+ pain limited    Hip extension    Hip abduction    Hip adduction    Hip internal rotation    Hip external rotation    Knee flexion 4- 3+  Knee extension 5 4-  Ankle dorsiflexion 5 5  Ankle plantarflexion    Ankle inversion    Ankle eversion     (Blank rows = not tested)  LOWER EXTREMITY SPECIAL TESTS:    FUNCTIONAL TESTS:  Eval:  Timed up and go (TUG): 23 seconds RW  3 minute walk test: 319ft with RW   GAIT: Distance walked: 351ft  Assistive device utilized: Environmental consultant - 2 wheeled Level of assistance: Modified independence Comments: flexed at hips, antalgic with less wt shift to left, limited ROM in L knee in stance and swing phases    TODAY'S TREATMENT:                                                                                                                              DATE:  02/01/23:  TherEx:  Nustep: level 6 x 6 minutes  Sit to stands with UE support X 10 reps Gait training with SPC 50 feet X 2, cues and demo for technique. Step ups on 6 inch step x 10 c single UE support Seated SLR:2  x 10  Seated LAQ: 2# 2 x 10  Seated knee flexion AAROM 5 sec X 15 Manual:  Seated knee flexion PROM with over pressures Overpressure with extension PROM Modalities:  Vasopneumatic: 34 deg, c 10 minutes, med compression   01/30/23:  TherEx:  Nustep: level 5 x 7 minutes  Calf stretch on slant board: 3 x 30 seconds Heel raises: x 15 c UE support Step ups on 6 inch step x 10 c single UE support Leg Press bil LE's:  50# 2 x 10, Left LE only 31 # x 10  Seated SLR: x 10  Seated LAQ: x 10  Heel slides x 10 Manual:  Seated knee flexion PROM with over pressures Overpressure with extension PROM Modalities:  Vasopneumatic: 34 deg, c 10 minutes, med compression    Eval  Objective measures + appropriate education  TherEx  SciFit bike seat 10 full rotations x6 minutes (but  hip compensations present)      PATIENT EDUCATION:  Education details: POC, HEP, exam findings, edema management,  general progression through PT after TKR, elevation strategies at home Person educated: Patient and significant other  Education method: Explanation, Demonstration, and Handouts Education comprehension: verbalized understanding, returned demonstration, and needs further education  HOME EXERCISE PROGRAM: Access Code: [email or web address] URL: [email or web address] Date: 01/30/2023 Prepared by: Kearney Hard  Exercises - Seated Long Arc Quad  - 4-5 x daily - 7 x weekly - 2 sets - 10 reps - 3-5 sec hold - Seated Small Alternating Straight Leg Lifts with Heel Touch  - 4-5 x daily - 7 x weekly - 2 sets - 10 reps - Supine Heel Slide with Strap  - 4-5 x daily - 7 x weekly - 10 reps - 5 seconds hold - Sit to Stand with Counter Support  - 4-5 x daily - 7 x weekly - 2 sets - 10 reps - Heel Raises with Counter Support  - 4-5 x daily - 7 x weekly - 2 sets - 10 reps   ASSESSMENT:  CLINICAL IMPRESSION: He is doing reasonably well post op TKA. We worked on Personnel officer today with [facility] and he appears ready for this inside the house but would recommend to continue using RW outside of home for now. He showed good tolerance to his strength and ROM program for left knee today.    OBJECTIVE IMPAIRMENTS: Abnormal gait, decreased activity tolerance, decreased balance, decreased knowledge of use of DME, decreased mobility, difficulty walking, decreased ROM, decreased strength, hypomobility, increased edema, increased fascial restrictions, impaired flexibility, and pain.   ACTIVITY LIMITATIONS: standing, squatting, stairs, transfers, and locomotion level  PARTICIPATION LIMITATIONS: driving, shopping, community activity, occupation, and yard work  PERSONAL FACTORS: Age, Behavior pattern, Education, Fitness, Past/current experiences, and Time since onset of injury/illness/exacerbation are also affecting patient's functional outcome.   REHAB POTENTIAL: Good  CLINICAL DECISION MAKING:  Stable/uncomplicated  EVALUATION COMPLEXITY: Low   GOALS: Goals reviewed with patient? Yes  SHORT TERM GOALS: Target date: 02/21/2023   Will be compliant with progressive appropriate HEP  Baseline: Goal status: On-going 01/30/23  2.  L knee extension AROM to be no more than 5* and flexion AROM to be at least 90* Baseline:  Goal status: INITIAL  3.  Will be able to ambulate in home distances with LRAD without significant gait deviation Baseline:  Goal status: INITIAL  4.  Will demonstrate equal weight bearing BLEs with functional transfers and no offshift from L LE  Baseline:  Goal status: INITIAL    LONG TERM GOALS: Target date: 03/21/2023    MMT to have improved by at least 1 grade in all weak groups  Baseline:  Goal status: INITIAL  2.  L knee flexion AROM to be at least 105*  Baseline:  Goal status: INITIAL  3.  Will be able to ambulate at least 476ft with LRAD in 3MWT to show improved functional mobility  Baseline:  Goal status: INITIAL  4.  Will be able to complete TUG in 13 seconds or less with LRAD in order to show improved functional mobility and balance  Baseline:  Goal status: INITIAL  5.  Will be able to reciprocal ascend/descend 7 steps with no increase in pain and U rail to improve home and community access  Baseline:  Goal status: INITIAL    PLAN:  PT FREQUENCY:  3x/week for first 4 weeks, then 2x/week for next 4 weeks   PT DURATION: 8 weeks  PLANNED INTERVENTIONS: Therapeutic exercises, Therapeutic activity, Neuromuscular re-education, Balance training, Gait training, Patient/Family education, Self Care, Joint mobilization, Stair training, DME instructions, Aquatic Therapy, Dry Needling, Electrical stimulation, Cryotherapy, Moist heat, Taping, Vasopneumatic device, Ultrasound, Ionotophoresis 4mg /ml Dexamethasone, Manual therapy, and Re-evaluation  PLAN FOR NEXT SESSION: gait with SPC and wean off RW as able, otherwise typical care for TKR  including ROM and strength, may benefit from manual heavy approach    Elsie Ra, PT, DPT 02/01/23 9:20 AM

## 2023-02-03 ENCOUNTER — Encounter: Payer: Self-pay | Admitting: Rehabilitative and Restorative Service Providers"

## 2023-02-03 ENCOUNTER — Ambulatory Visit (INDEPENDENT_AMBULATORY_CARE_PROVIDER_SITE_OTHER): Payer: Managed Care, Other (non HMO) | Admitting: Rehabilitative and Restorative Service Providers"

## 2023-02-03 DIAGNOSIS — R262 Difficulty in walking, not elsewhere classified: Secondary | ICD-10-CM | POA: Diagnosis not present

## 2023-02-03 DIAGNOSIS — R600 Localized edema: Secondary | ICD-10-CM | POA: Diagnosis not present

## 2023-02-03 DIAGNOSIS — M25662 Stiffness of left knee, not elsewhere classified: Secondary | ICD-10-CM | POA: Diagnosis not present

## 2023-02-03 DIAGNOSIS — M25562 Pain in left knee: Secondary | ICD-10-CM

## 2023-02-03 NOTE — Therapy (Signed)
OUTPATIENT PHYSICAL THERAPY LOWER EXTREMITY  TREATMENT NOTE   Patient Name: Craig House MRN: [id number] DOB:01/23/1961, 62 y.o., male Today's Date: 02/03/2023  END OF SESSION:  PT End of Session - 02/03/23 1232     Visit Number 4    Number of Visits 21    Date for PT Re-Evaluation 03/21/23    Authorization Type [employer]    Authorization - Number of Visits 90    PT Start Time [id number]    PT Stop Time 1237    PT Time Calculation (min) 50 min    Activity Tolerance Patient tolerated treatment well;No increased pain;Patient limited by pain    Behavior During Therapy [facility] for tasks assessed/performed              Past Medical History:  Diagnosis Date   Allergy    spring   Anemia    Asthma    Diabetes mellitus without complication ([place])    type II   Hyperplastic colon polyp    Hypertension    Obesity    Past Surgical History:  Procedure Laterality Date   KNEE ARTHROSCOPY Bilateral    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   Procedure: LEFT TOTAL KNEE ARTHROPLASTY;  Surgeon: Leandrew Koyanagi, MD;  Location: [place];  Service: Orthopedics;  Laterality: Left;   Patient Active Problem List   Diagnosis Date Noted   Primary osteoarthritis of left knee 01/09/2023   Status post total left knee replacement 01/09/2023   Bilateral primary osteoarthritis of knee 06/19/2018   Mild intermittent asthma, uncomplicated [id number]   HTN (hypertension) 12/31/2011    PCP: Elise Benne   REFERRING PROVIDER: Leandrew Koyanagi, MD  REFERRING DIAG: [phone] (ICD-10-CM) - Primary osteoarthritis of left knee  THERAPY DIAG:  Acute pain of left knee  Stiffness of left knee, not elsewhere classified  Difficulty in walking, not elsewhere classified  Localized edema  Rationale for Evaluation and Treatment: Rehabilitation  ONSET DATE: 01/05/2023  SUBJECTIVE:   SUBJECTIVE STATEMENT: Craig House reports consistent HEP compliance.  He is also still using his CPM.  PERTINENT  HISTORY: Hospital Course: Dereke Binion is an 62 y.o. male who was admitted 01/09/2023 for operative treatment ofPrimary osteoarthritis of left knee. Patient has severe unremitting pain that affects sleep, daily activities, and work/hobbies. After pre-op clearance the patient was taken to the operating room on 01/09/2023 and underwent  Procedure(s): LEFT TOTAL KNEE ARTHROPLASTY.   PAIN:  Are you having pain? Yes: NPRS scale: 3-6/10 this week on a 0-10/10 Pain location: L knee  Pain description: aching throb, stiffness  Aggravating factors: movement  Relieving factors: staying still, rest   PRECAUTIONS: Fall  WEIGHT BEARING RESTRICTIONS: No  FALLS:  Has patient fallen in last 6 months? 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   PLOF: Independent, Independent with basic ADLs, Independent with gait, and Independent with transfers  PATIENT GOALS: get movement and mobility back   NEXT MD VISIT: Referring in 4 weeks   OBJECTIVE:   DIAGNOSTIC FINDINGS:   PATIENT SURVEYS:  FOTO 43  COGNITION: Overall cognitive status: Within functional limits for tasks assessed     SENSATION: Not tested  EDEMA:  Appropriate for post-op state   MUSCLE LENGTH:    POSTURE:   PALPATION:   LOWER EXTREMITY ROM:  Active ROM Right eval Left eval Left 01/30/23 Left 02/03/23  Hip flexion      Hip extension      Hip abduction      Hip adduction      Hip internal rotation      Hip external rotation      Knee flexion  73* A: 80 P: 85 Active 92  Knee extension  15* A: 12 P: 10 -12  Ankle dorsiflexion      Ankle plantarflexion      Ankle inversion      Ankle eversion        (Blank rows = not tested)  LOWER EXTREMITY MMT:  MMT Right eval Left eval  Hip flexion 4+ 3+ pain limited   Hip extension    Hip abduction    Hip adduction    Hip internal rotation    Hip external rotation    Knee flexion 4- 3+  Knee extension 5 4-  Ankle dorsiflexion 5 5  Ankle plantarflexion    Ankle inversion    Ankle eversion     (Blank rows = not tested)  LOWER EXTREMITY SPECIAL TESTS:    FUNCTIONAL TESTS:  Eval:  Timed up and go (TUG): 23 seconds RW  3 minute walk test: 334ft with RW   GAIT: Distance walked: 343ft  Assistive device utilized: Environmental consultant - 2 wheeled Level of assistance: Modified independence Comments: flexed at hips, antalgic with less wt shift to left, limited ROM in L knee in stance and swing phases    TODAY'S TREATMENT:                                                                                                                              DATE:  02/03/2023: NuStep Level 6 for 6 minutes (3 minutes extension emphasis and 3 minutes flexion emphasis) Tailgate knee flexion 1 minute AAROM left knee flexion 10X 10 seconds with PT overpressure Quadriceps sets with left heel prop 2 sets of 10 for 5 seconds Modified Thomas stretch for left hip flexors 4X 20 seconds  Seated knee extension stretch 5 minutes (left leg in chair) 3 minutes with 5# Attempted left knee extension stretch prone but we were limited by left hip flexors tightness (moved to the chair)  Vaso left knee Medium 10 minutes 34*   02/01/23:  TherEx:  Nustep: level 6 x 6 minutes  Sit to stands with UE support X 10 reps Gait training with SPC 50 feet X 2, cues and demo for technique. Step ups on 6 inch step x 10 c single UE support Seated SLR:2  x 10  Seated LAQ: 2# 2 x 10  Seated knee flexion AAROM 5 sec X 15 Manual:  Seated knee flexion PROM with over pressures Overpressure with extension PROM Modalities:  Vasopneumatic: 34 deg, c 10 minutes,  med compression    01/30/23:   TherEx:  Nustep: level 5 x 7 minutes  Calf stretch on slant board: 3 x 30 seconds Heel raises: x 15 c UE support Step ups on 6 inch step x 10 c single UE support Leg Press bil LE's:  50# 2 x 10, Left LE only 31 # x 10  Seated SLR: x 10  Seated LAQ: x 10  Heel slides x 10 Manual:  Seated knee flexion PROM with over pressures Overpressure with extension PROM Modalities:  Vasopneumatic: 34 deg, c 10 minutes, med compression    PATIENT EDUCATION:  Education details: POC, HEP, exam findings, edema management, general progression through PT after TKR, elevation strategies at home Person educated: Patient and significant other  Education method: Explanation, Demonstration, and Handouts Education comprehension: verbalized understanding, returned demonstration, and needs further education  HOME EXERCISE PROGRAM: Access Code: [email or web address] URL: [email or web address] Date: 02/03/2023 Prepared by: Vista Mink  Exercises - Seated Long Arc Quad  - 4-5 x daily - 7 x weekly - 2 sets - 10 reps - 3-5 sec hold - Seated Small Alternating Straight Leg Lifts with Heel Touch  - 4-5 x daily - 7 x weekly - 2 sets - 10 reps - Supine Heel Slide with Strap  - 4-5 x daily - 7 x weekly - 10 reps - 5 seconds hold - Sit to Stand with Counter Support  - 4-5 x daily - 7 x weekly - 2 sets - 10 reps - Heel Raises with Counter Support  - 4-5 x daily - 7 x weekly - 2 sets - 10 reps - Supine Quadricep Sets  - 5 x daily - 7 x weekly - 2 sets - 10 reps - 5 second hold - Seated Passive Knee Extension  - 3 x daily - 7 x weekly - 1 sets - 1 reps - 3-5 minutes hold  ASSESSMENT:  CLINICAL IMPRESSION: Craig House reports good early HEP compliance.  We reviewed all exercises and performed specific ones focusing on AROM.  Craig House's HEP is a good one and I encouraged him to do 100 quadriceps sets and 50 knee flexion activities per day to meet long-term AROM goals.  Continue POC with AROM (particularly extension)  emphasis.  OBJECTIVE IMPAIRMENTS: Abnormal gait, decreased activity tolerance, decreased balance, decreased knowledge of use of DME, decreased mobility, difficulty walking, decreased ROM, decreased strength, hypomobility, increased edema, increased fascial restrictions, impaired flexibility, and pain.   ACTIVITY LIMITATIONS: standing, squatting, stairs, transfers, and locomotion level  PARTICIPATION LIMITATIONS: driving, shopping, community activity, occupation, and yard work  PERSONAL FACTORS: Age, Behavior pattern, Education, Fitness, Past/current experiences, and Time since onset of injury/illness/exacerbation are also affecting patient's functional outcome.   REHAB POTENTIAL: Good  CLINICAL DECISION MAKING: Stable/uncomplicated  EVALUATION COMPLEXITY: Low   GOALS: Goals reviewed with patient? Yes  SHORT TERM GOALS: Target date: 02/21/2023   Will be compliant with progressive appropriate HEP  Baseline: Goal status: On-going 02/03/23  2.  L knee extension AROM to be no more than 5* and flexion AROM to be at least 90* Baseline:  Goal status: Partially Met 02/03/2023  3.  Will be able to ambulate in home distances with LRAD without significant gait deviation Baseline:  Goal status: On Going 02/03/2023  4.  Will demonstrate equal weight bearing BLEs with functional transfers and no offshift from L LE  Baseline:  Goal status: INITIAL    LONG TERM GOALS: Target date: 03/21/2023    MMT to have improved by at  least 1 grade in all weak groups  Baseline:  Goal status: INITIAL  2.  L knee flexion AROM to be at least 105*  Baseline:  Goal status: INITIAL  3.  Will be able to ambulate at least 417ft with LRAD in 3MWT to show improved functional mobility  Baseline:  Goal status: INITIAL  4.  Will be able to complete TUG in 13 seconds or less with LRAD in order to show improved functional mobility and balance  Baseline:  Goal status: INITIAL  5.  Will be able to reciprocal  ascend/descend 7 steps with no increase in pain and U rail to improve home and community access  Baseline:  Goal status: INITIAL    PLAN:  PT FREQUENCY:  3x/week for first 4 weeks, then 2x/week for next 4 weeks   PT DURATION: 8 weeks  PLANNED INTERVENTIONS: Therapeutic exercises, Therapeutic activity, Neuromuscular re-education, Balance training, Gait training, Patient/Family education, Self Care, Joint mobilization, Stair training, DME instructions, Aquatic Therapy, Dry Needling, Electrical stimulation, Cryotherapy, Moist heat, Taping, Vasopneumatic device, Ultrasound, Ionotophoresis 4mg /ml Dexamethasone, Manual therapy, and Re-evaluation  PLAN FOR NEXT SESSION: Gait with SPC and wean off RW as able, otherwise typical care for TKR including ROM (extension emphasis) and strength, may benefit from manual heavy approach.    Farley Ly PT, MPT 02/03/23 12:41 PM

## 2023-02-06 ENCOUNTER — Ambulatory Visit (INDEPENDENT_AMBULATORY_CARE_PROVIDER_SITE_OTHER): Payer: Managed Care, Other (non HMO) | Admitting: Physical Therapy

## 2023-02-06 ENCOUNTER — Encounter: Payer: Self-pay | Admitting: Physical Therapy

## 2023-02-06 DIAGNOSIS — R262 Difficulty in walking, not elsewhere classified: Secondary | ICD-10-CM | POA: Diagnosis not present

## 2023-02-06 DIAGNOSIS — R600 Localized edema: Secondary | ICD-10-CM | POA: Diagnosis not present

## 2023-02-06 DIAGNOSIS — M25562 Pain in left knee: Secondary | ICD-10-CM

## 2023-02-06 DIAGNOSIS — M25662 Stiffness of left knee, not elsewhere classified: Secondary | ICD-10-CM | POA: Diagnosis not present

## 2023-02-06 NOTE — Therapy (Signed)
OUTPATIENT PHYSICAL THERAPY LOWER EXTREMITY  TREATMENT NOTE   Patient Name: Craig House MRN: [id number] DOB:Jan 30, 1961, 62 y.o., male Today's Date: 02/06/2023  END OF SESSION:  PT End of Session - 02/06/23 1520     Visit Number 5    Number of Visits 21    Date for PT Re-Evaluation 03/21/23    Authorization Type [employer]    Authorization - Number of Visits 90    PT Start Time [id number]    PT Stop Time 1603    PT Time Calculation (min) 46 min    Activity Tolerance Patient tolerated treatment well;No increased pain;Patient limited by pain    Behavior During Therapy [facility] for tasks assessed/performed               Past Medical History:  Diagnosis Date   Allergy    spring   Anemia    Asthma    Diabetes mellitus without complication ([place])    type II   Hyperplastic colon polyp    Hypertension    Obesity    Past Surgical History:  Procedure Laterality Date   KNEE ARTHROSCOPY Bilateral    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   Procedure: LEFT TOTAL KNEE ARTHROPLASTY;  Surgeon: Leandrew Koyanagi, MD;  Location: [place];  Service: Orthopedics;  Laterality: Left;   Patient Active Problem List   Diagnosis Date Noted   Primary osteoarthritis of left knee 01/09/2023   Status post total left knee replacement 01/09/2023   Bilateral primary osteoarthritis of knee 06/19/2018   Mild intermittent asthma, uncomplicated [id number]   HTN (hypertension) 12/31/2011    PCP: Elise Benne   REFERRING PROVIDER: Leandrew Koyanagi, MD  REFERRING DIAG: [phone] (ICD-10-CM) - Primary osteoarthritis of left knee  THERAPY DIAG:  Acute pain of left knee  Stiffness of left knee, not elsewhere classified  Difficulty in walking, not elsewhere classified  Localized edema  Rationale for Evaluation and Treatment: Rehabilitation  ONSET DATE: 01/05/2023  SUBJECTIVE:   SUBJECTIVE STATEMENT: Craig House reporting pain today of 3/10 in his left knee. Pt stating he has increased his CPM to 110  degrees. Pt stating he is still taking prescription pain meds for pain.   PERTINENT HISTORY: Hospital Course: Craig House is an 62 y.o. male who was admitted 01/09/2023 for operative treatment ofPrimary osteoarthritis of left knee. Patient has severe unremitting pain that affects sleep, daily activities, and work/hobbies. After pre-op clearance the patient was taken to the operating room on 01/09/2023 and underwent  Procedure(s): LEFT TOTAL KNEE ARTHROPLASTY.   PAIN:  Are you having pain? Yes: NPRS scale: 3/10 Pain location: L knee  Pain description: aching throb, stiffness  Aggravating factors: movement  Relieving factors: staying still, rest   PRECAUTIONS: Fall  WEIGHT BEARING RESTRICTIONS: No  FALLS:  Has patient fallen in last 6 months? 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   PLOF: Independent, Independent with basic ADLs, Independent with gait, and Independent with transfers  PATIENT GOALS: get movement and mobility back   NEXT MD VISIT: Referring in 4 weeks   OBJECTIVE:   DIAGNOSTIC FINDINGS:   PATIENT SURVEYS:  FOTO 57  COGNITION: Overall cognitive status: Within  functional limits for tasks assessed     SENSATION: Not tested  EDEMA:   Appropriate for post-op state   MUSCLE LENGTH:    POSTURE:   PALPATION:   LOWER EXTREMITY ROM:  Active ROM = A Passive ROM = P Right eval Left eval Left 01/30/23 Left 02/03/23 Left 02/06/23  Hip flexion       Hip extension       Hip abduction       Hip adduction       Hip internal rotation       Hip external rotation       Knee flexion  73* A: 80 P: 85 Active 92 A: 90 P: 95  Knee extension  15*  A: 12 P: 10 -12 A: 12 P: 10  Ankle dorsiflexion       Ankle plantarflexion       Ankle inversion       Ankle eversion        (Blank rows = not tested)  LOWER EXTREMITY MMT:  MMT Right eval Left eval  Hip flexion 4+ 3+ pain limited   Hip extension    Hip abduction    Hip adduction    Hip internal rotation    Hip external rotation    Knee flexion 4- 3+  Knee extension 5 4-  Ankle dorsiflexion 5 5  Ankle plantarflexion    Ankle inversion    Ankle eversion     (Blank rows = not tested)  LOWER EXTREMITY SPECIAL TESTS:    FUNCTIONAL TESTS:  Eval:  Timed up and go (TUG): 23 seconds RW  3 minute walk test: 385ft with RW   GAIT: Distance walked: 341ft  Assistive device utilized: Environmental consultant - 2 wheeled Level of assistance: Modified independence Comments: flexed at hips, antalgic with less wt shift to left, limited ROM in L knee in stance and swing phases    TODAY'S TREATMENT:                                                                                                                              DATE:  02/06/23:  TherEx:  Recumbent bike seat 7, partial revolutions to full revolutions x 10 minutes Calf stretch on slant board: x 60 seconds Step ups on 6 inch step x 15 c single UE support Lateral straddle step on 6 inch step x 15 leading with left c UE support Seated SLR:2  x 10  Seated LAQ: 4# 2 x 10  Leg Press: 75# 2 x 10, Left LE only 31# 2 x 10 Manual:  Seated knee flexion PROM  Overpressure with extension PROM Modalities:  Vasopneumatic: 34 deg, x  10 minutes, med compression      02/03/2023: NuStep Level 6 for 6 minutes (3 minutes extension emphasis and 3 minutes flexion emphasis) Tailgate knee flexion 1 minute AAROM left knee flexion 10X 10 seconds with PT overpressure Quadriceps sets with left heel prop 2 sets  of 10 for 5 seconds Modified Thomas stretch for left hip flexors 4X 20 seconds  Seated knee extension stretch 5 minutes (left leg in chair) 3  minutes with 5# Attempted left knee extension stretch prone but we were limited by left hip flexors tightness (moved to the chair)  Vaso left knee Medium 10 minutes 34*   02/01/23:  TherEx:  Nustep: level 6 x 6 minutes  Sit to stands with UE support X 10 reps Gait training with SPC 50 feet X 2, cues and demo for technique. Step ups on 6 inch step x 10 c single UE support Seated SLR:2  x 10  Seated LAQ: 2# 2 x 10  Seated knee flexion AAROM 5 sec X 15 Manual:  Seated knee flexion PROM with over pressures Overpressure with extension PROM Modalities:  Vasopneumatic: 34 deg, c 10 minutes, med compression      PATIENT EDUCATION:  Education details: POC, HEP, exam findings, edema management, general progression through PT after TKR, elevation strategies at home Person educated: Patient and significant other  Education method: Explanation, Demonstration, and Handouts Education comprehension: verbalized understanding, returned demonstration, and needs further education  HOME EXERCISE PROGRAM: Access Code: [email or web address] URL: [email or web address] Date: 02/03/2023 Prepared by: Vista Mink  Exercises - Seated Long Arc Quad  - 4-5 x daily - 7 x weekly - 2 sets - 10 reps - 3-5 sec hold - Seated Small Alternating Straight Leg Lifts with Heel Touch  - 4-5 x daily - 7 x weekly - 2 sets - 10 reps - Supine Heel Slide with Strap  - 4-5 x daily - 7 x weekly - 10 reps - 5 seconds hold - Sit to Stand with Counter Support  - 4-5 x daily - 7 x weekly - 2 sets - 10 reps - Heel Raises with Counter Support  - 4-5 x daily - 7 x weekly - 2 sets - 10 reps - Supine Quadricep Sets  - 5 x daily - 7 x weekly - 2 sets - 10 reps - 5 second hold - Seated Passive Knee Extension  - 3 x daily - 7 x weekly - 1 sets - 1 reps - 3-5 minutes hold  ASSESSMENT:  CLINICAL IMPRESSION:  Pt tolerating exercises well with increased knee flexion noted his visit. Pt was able to complete full revolutions on the  recumbent bike and is making progress with amb around his home with st cane.  Pt still utilizing RW for community amb. Continue skilled PT to maximize pt's function.    OBJECTIVE IMPAIRMENTS: Abnormal gait, decreased activity tolerance, decreased balance, decreased knowledge of use of DME, decreased mobility, difficulty walking, decreased ROM, decreased strength, hypomobility, increased edema, increased fascial restrictions, impaired flexibility, and pain.   ACTIVITY LIMITATIONS: standing, squatting, stairs, transfers, and locomotion level  PARTICIPATION LIMITATIONS: driving, shopping, community activity, occupation, and yard work  PERSONAL FACTORS: Age, Behavior pattern, Education, Fitness, Past/current experiences, and Time since onset of injury/illness/exacerbation are also affecting patient's functional outcome.   REHAB POTENTIAL: Good  CLINICAL DECISION MAKING: Stable/uncomplicated  EVALUATION COMPLEXITY: Low   GOALS: Goals reviewed with patient? Yes  SHORT TERM GOALS: Target date: 02/21/2023   Will be compliant with progressive appropriate HEP  Baseline: Goal status: MET 02/06/23  2.  L knee extension AROM to be no more than 5* and flexion AROM to be at least 90* Baseline:  Goal status: Partially Met 02/03/2023  3.  Will be able to ambulate in home distances with LRAD without significant gait  deviation Baseline:  Goal status: On Going 02/06/2023 using St. cane  4.  Will demonstrate equal weight bearing BLEs with functional transfers and no offshift from L LE  Baseline:  Goal status: On-going 02/06/23    LONG TERM GOALS: Target date: 03/21/2023    MMT to have improved by at least 1 grade in all weak groups  Baseline:  Goal status: INITIAL  2.  L knee flexion AROM to be at least 105*  Baseline:  Goal status: INITIAL  3.  Will be able to ambulate at least 479ft with LRAD in 3MWT to show improved functional mobility  Baseline:  Goal status: INITIAL  4.  Will be able  to complete TUG in 13 seconds or less with LRAD in order to show improved functional mobility and balance  Baseline:  Goal status: INITIAL  5.  Will be able to reciprocal ascend/descend 7 steps with no increase in pain and U rail to improve home and community access  Baseline:  Goal status: INITIAL    PLAN:  PT FREQUENCY:  3x/week for first 4 weeks, then 2x/week for next 4 weeks   PT DURATION: 8 weeks  PLANNED INTERVENTIONS: Therapeutic exercises, Therapeutic activity, Neuromuscular re-education, Balance training, Gait training, Patient/Family education, Self Care, Joint mobilization, Stair training, DME instructions, Aquatic Therapy, Dry Needling, Electrical stimulation, Cryotherapy, Moist heat, Taping, Vasopneumatic device, Ultrasound, Ionotophoresis 4mg /ml Dexamethasone, Manual therapy, and Re-evaluation  PLAN FOR NEXT SESSION: Gait with SPC and wean off RW as able, otherwise typical care for TKR including ROM (extension emphasis) and strength, may benefit from manual heavy approach.    Kearney Hard, PT, MPT 02/06/23 3:46 PM

## 2023-02-07 ENCOUNTER — Ambulatory Visit (INDEPENDENT_AMBULATORY_CARE_PROVIDER_SITE_OTHER): Payer: Managed Care, Other (non HMO) | Admitting: Physical Therapy

## 2023-02-07 ENCOUNTER — Encounter: Payer: Self-pay | Admitting: Physical Therapy

## 2023-02-07 DIAGNOSIS — R262 Difficulty in walking, not elsewhere classified: Secondary | ICD-10-CM

## 2023-02-07 DIAGNOSIS — M25662 Stiffness of left knee, not elsewhere classified: Secondary | ICD-10-CM

## 2023-02-07 DIAGNOSIS — R600 Localized edema: Secondary | ICD-10-CM

## 2023-02-07 DIAGNOSIS — M25562 Pain in left knee: Secondary | ICD-10-CM | POA: Diagnosis not present

## 2023-02-07 NOTE — Therapy (Signed)
OUTPATIENT PHYSICAL THERAPY LOWER EXTREMITY  TREATMENT NOTE   Patient Name: Craig House MRN: [id number] DOB:1961-03-28, 62 y.o., male Today's Date: 02/07/2023  END OF SESSION:  PT End of Session - 02/07/23 1353     Visit Number 6    Number of Visits 21    Date for PT Re-Evaluation 03/21/23    Authorization Type [employer]    Authorization - Number of Visits 2    PT Start Time [id number]    PT Stop Time 1426    PT Time Calculation (min) 43 min    Activity Tolerance Patient tolerated treatment well    Behavior During Therapy [facility] for tasks assessed/performed                Past Medical History:  Diagnosis Date   Allergy    spring   Anemia    Asthma    Diabetes mellitus without complication ([place])    type II   Hyperplastic colon polyp    Hypertension    Obesity    Past Surgical History:  Procedure Laterality Date   KNEE ARTHROSCOPY Bilateral    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   Procedure: LEFT TOTAL KNEE ARTHROPLASTY;  Surgeon: Craig Koyanagi, MD;  Location: [facility];  Service: Orthopedics;  Laterality: Left;   Patient Active Problem List   Diagnosis Date Noted   Primary osteoarthritis of left knee 01/09/2023   Status post total left knee replacement 01/09/2023   Bilateral primary osteoarthritis of knee 06/19/2018   Mild intermittent asthma, uncomplicated [id number]   HTN (hypertension) 12/31/2011    PCP: Craig House   REFERRING PROVIDER: Leandrew Koyanagi, MD  REFERRING DIAG: [phone] (ICD-10-CM) - Primary osteoarthritis of left knee  THERAPY DIAG:  Acute pain of left knee  Stiffness of left knee, not elsewhere classified  Difficulty in walking, not elsewhere classified  Localized edema  Rationale for Evaluation and Treatment: Rehabilitation  ONSET DATE: 01/05/2023  SUBJECTIVE:   SUBJECTIVE STATEMENT:  I'm disappointed in myself, I'm one of those people that feels like I should get instant results and I'm not. I've been doing all my  exercises, still getting on CPM machine too.   PERTINENT HISTORY: Hospital Course: Craig House is an 62 y.o. male who was admitted 01/09/2023 for operative treatment ofPrimary osteoarthritis of left knee. Patient has severe unremitting pain that affects sleep, daily activities, and work/hobbies. After pre-op clearance the patient was taken to the operating room on 01/09/2023 and underwent  Procedure(s): LEFT TOTAL KNEE ARTHROPLASTY.   PAIN:  Are you having pain? Yes: NPRS scale: 3/10 Pain location: L knee  Pain description: aching throb, stiffness  Aggravating factors: stretching and bending  Relieving factors: ice   PRECAUTIONS: Fall  WEIGHT BEARING RESTRICTIONS: No  FALLS:  Has patient fallen in last 6 months? 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   PLOF: Independent, Independent with basic ADLs, Independent with gait, and Independent with transfers  PATIENT GOALS: get movement and mobility back   NEXT MD VISIT: Referring in 4 weeks   OBJECTIVE:   DIAGNOSTIC FINDINGS:   PATIENT SURVEYS:  FOTO 34  COGNITION: Overall cognitive status: Within functional limits  for tasks assessed     SENSATION: Not tested  EDEMA:   Appropriate for post-op state   MUSCLE LENGTH:    POSTURE:   PALPATION:   LOWER EXTREMITY ROM:  Active ROM = A Passive ROM = P Right eval Left eval Left 01/30/23 Left 02/03/23 Left 02/06/23  Hip flexion       Hip extension       Hip abduction       Hip adduction       Hip internal rotation       Hip external rotation       Knee flexion  73* A: 80 P: 85 Active 92 A: 90 P: 95  Knee extension  15* A: 12 P: 10 -12 A: 12 P: 10   Ankle dorsiflexion       Ankle plantarflexion       Ankle inversion       Ankle eversion        (Blank rows = not tested)  LOWER EXTREMITY MMT:  MMT Right eval Left eval  Hip flexion 4+ 3+ pain limited   Hip extension    Hip abduction    Hip adduction    Hip internal rotation    Hip external rotation    Knee flexion 4- 3+  Knee extension 5 4-  Ankle dorsiflexion 5 5  Ankle plantarflexion    Ankle inversion    Ankle eversion     (Blank rows = not tested)  LOWER EXTREMITY SPECIAL TESTS:    FUNCTIONAL TESTS:  Eval:  Timed up and go (TUG): 23 seconds RW  3 minute walk test: 328ft with RW   GAIT: Distance walked: 352ft  Assistive device utilized: Environmental consultant - 2 wheeled Level of assistance: Modified independence Comments: flexed at hips, antalgic with less wt shift to left, limited ROM in L knee in stance and swing phases    TODAY'S TREATMENT:                                                                                                                              DATE:   02/07/23  TherEx  Scifit bike seat 7 full rotations x6 minutes, progressed to seat 6 for last minute of time Knee extension stretch 5# x3 minutes SAQs 5# x10 with 3 second holds  SLRs + quad set x15 cues for quality of movement/quad recruitment  LAQs 5# 1x10 with 3 second holds  Standing TKEs x10 with 3 second holds    Manual  Knee extension overpressure with heel prop as able,x10 mixed depth/pressure as pt resisted on some reps Knee flexion AAROM seated x10       02/06/23:  TherEx:  Recumbent bike seat 7, partial revolutions to full revolutions x 10 minutes Calf stretch on slant board: x 60 seconds Step ups on 6 inch step x 15 c single UE support Lateral straddle step on 6 inch step x 15 leading with left c UE  support Seated SLR:2  x 10  Seated LAQ: 4# 2 x 10  Leg Press: 75# 2 x 10, Left LE only 31# 2 x 10 Manual:  Seated knee flexion PROM  Overpressure with extension  PROM Modalities:  Vasopneumatic: 34 deg, x  10 minutes, med compression      02/03/2023: NuStep Level 6 for 6 minutes (3 minutes extension emphasis and 3 minutes flexion emphasis) Tailgate knee flexion 1 minute AAROM left knee flexion 10X 10 seconds with PT overpressure Quadriceps sets with left heel prop 2 sets of 10 for 5 seconds Modified Thomas stretch for left hip flexors 4X 20 seconds  Seated knee extension stretch 5 minutes (left leg in chair) 3 minutes with 5# Attempted left knee extension stretch prone but we were limited by left hip flexors tightness (moved to the chair)  Vaso left knee Medium 10 minutes 34*   02/01/23:  TherEx:  Nustep: level 6 x 6 minutes  Sit to stands with UE support X 10 reps Gait training with SPC 50 feet X 2, cues and demo for technique. Step ups on 6 inch step x 10 c single UE support Seated SLR:2  x 10  Seated LAQ: 2# 2 x 10  Seated knee flexion AAROM 5 sec X 15 Manual:  Seated knee flexion PROM with over pressures Overpressure with extension PROM Modalities:  Vasopneumatic: 34 deg, c 10 minutes, med compression      PATIENT EDUCATION:  Education details: POC, HEP, exam findings, edema management, general progression through PT after TKR, elevation strategies at home Person educated: Patient and significant other  Education method: Explanation, Demonstration, and Handouts Education comprehension: verbalized understanding, returned demonstration, and needs further education  HOME EXERCISE PROGRAM: Access Code: [email or web address] URL: [email or web address] Date: 02/03/2023 Prepared by: Vista Mink  Exercises - Seated Long Arc Quad  - 4-5 x daily - 7 x weekly - 2 sets - 10 reps - 3-5 sec hold - Seated Small Alternating Straight Leg Lifts with Heel Touch  - 4-5 x daily - 7 x weekly - 2 sets - 10 reps - Supine Heel Slide with Strap  - 4-5 x daily - 7 x weekly - 10 reps - 5 seconds hold - Sit to Stand with Counter Support  - 4-5 x  daily - 7 x weekly - 2 sets - 10 reps - Heel Raises with Counter Support  - 4-5 x daily - 7 x weekly - 2 sets - 10 reps - Supine Quadricep Sets  - 5 x daily - 7 x weekly - 2 sets - 10 reps - 5 second hold - Seated Passive Knee Extension  - 3 x daily - 7 x weekly - 1 sets - 1 reps - 3-5 minutes hold  ASSESSMENT:  CLINICAL IMPRESSION:   Craig House arrives today doing well, needed a bit of encouragement today due to long process of rehab from TKR but remains very motivated. Continued working on ROM and functional strength as able, also incorporated manual techniques as appropriate. Will continue efforts.    OBJECTIVE IMPAIRMENTS: Abnormal gait, decreased activity tolerance, decreased balance, decreased knowledge of use of DME, decreased mobility, difficulty walking, decreased ROM, decreased strength, hypomobility, increased edema, increased fascial restrictions, impaired flexibility, and pain.   ACTIVITY LIMITATIONS: standing, squatting, stairs, transfers, and locomotion level  PARTICIPATION LIMITATIONS: driving, shopping, community activity, occupation, and yard work  PERSONAL FACTORS: Age, Behavior pattern, Education, Fitness, Past/current experiences, and Time since onset of injury/illness/exacerbation are also affecting patient's functional outcome.  REHAB POTENTIAL: Good  CLINICAL DECISION MAKING: Stable/uncomplicated  EVALUATION COMPLEXITY: Low   GOALS: Goals reviewed with patient? Yes  SHORT TERM GOALS: Target date: 02/21/2023   Will be compliant with progressive appropriate HEP  Baseline: Goal status: MET 02/06/23  2.  L knee extension AROM to be no more than 5* and flexion AROM to be at least 90* Baseline:  Goal status: Partially Met 02/03/2023  3.  Will be able to ambulate in home distances with LRAD without significant gait deviation Baseline:  Goal status: On Going 02/06/2023 using St. cane  4.  Will demonstrate equal weight bearing BLEs with functional transfers and no  offshift from L LE  Baseline:  Goal status: On-going 02/06/23    LONG TERM GOALS: Target date: 03/21/2023    MMT to have improved by at least 1 grade in all weak groups  Baseline:  Goal status: INITIAL  2.  L knee flexion AROM to be at least 105*  Baseline:  Goal status: INITIAL  3.  Will be able to ambulate at least 41ft with LRAD in 3MWT to show improved functional mobility  Baseline:  Goal status: INITIAL  4.  Will be able to complete TUG in 13 seconds or less with LRAD in order to show improved functional mobility and balance  Baseline:  Goal status: INITIAL  5.  Will be able to reciprocal ascend/descend 7 steps with no increase in pain and U rail to improve home and community access  Baseline:  Goal status: INITIAL    PLAN:  PT FREQUENCY:  3x/week for first 4 weeks, then 2x/week for next 4 weeks   PT DURATION: 8 weeks  PLANNED INTERVENTIONS: Therapeutic exercises, Therapeutic activity, Neuromuscular re-education, Balance training, Gait training, Patient/Family education, Self Care, Joint mobilization, Stair training, DME instructions, Aquatic Therapy, Dry Needling, Electrical stimulation, Cryotherapy, Moist heat, Taping, Vasopneumatic device, Ultrasound, Ionotophoresis 4mg /ml Dexamethasone, Manual therapy, and Re-evaluation  PLAN FOR NEXT SESSION: Gait with SPC, how does he feel after transition to [facility] 100% of the time? otherwise typical care for TKR including ROM (extension emphasis) and strength, may benefit from manual heavy approach.   Deniece Ree PT DPT PN2

## 2023-02-08 ENCOUNTER — Ambulatory Visit (INDEPENDENT_AMBULATORY_CARE_PROVIDER_SITE_OTHER): Payer: Managed Care, Other (non HMO) | Admitting: Physical Therapy

## 2023-02-08 ENCOUNTER — Encounter: Payer: Self-pay | Admitting: Physical Therapy

## 2023-02-08 DIAGNOSIS — M25562 Pain in left knee: Secondary | ICD-10-CM

## 2023-02-08 DIAGNOSIS — R262 Difficulty in walking, not elsewhere classified: Secondary | ICD-10-CM | POA: Diagnosis not present

## 2023-02-08 DIAGNOSIS — R600 Localized edema: Secondary | ICD-10-CM

## 2023-02-08 DIAGNOSIS — M25662 Stiffness of left knee, not elsewhere classified: Secondary | ICD-10-CM | POA: Diagnosis not present

## 2023-02-08 NOTE — Therapy (Signed)
OUTPATIENT PHYSICAL THERAPY LOWER EXTREMITY  TREATMENT NOTE   Patient Name: Craig House MRN: [id number] DOB:05/31/1961, 63 y.o., male Today's Date: 02/08/2023  END OF SESSION:  PT End of Session - 02/08/23 1018     Visit Number 7    Number of Visits 21    Date for PT Re-Evaluation 03/21/23    Authorization Type [employer]    Authorization - Number of Visits 34    PT Start Time 1011    PT Stop Time 1052    PT Time Calculation (min) 41 min    Activity Tolerance Patient tolerated treatment well    Behavior During Therapy [facility] for tasks assessed/performed                 Past Medical History:  Diagnosis Date   Allergy    spring   Anemia    Asthma    Diabetes mellitus without complication ([place])    type II   Hyperplastic colon polyp    Hypertension    Obesity    Past Surgical History:  Procedure Laterality Date   KNEE ARTHROSCOPY Bilateral    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   Procedure: LEFT TOTAL KNEE ARTHROPLASTY;  Surgeon: Leandrew Koyanagi, MD;  Location: [facility];  Service: Orthopedics;  Laterality: Left;   Patient Active Problem List   Diagnosis Date Noted   Primary osteoarthritis of left knee 01/09/2023   Status post total left knee replacement 01/09/2023   Bilateral primary osteoarthritis of knee 06/19/2018   Mild intermittent asthma, uncomplicated [id number]   HTN (hypertension) 12/31/2011    PCP: Elise Benne   REFERRING PROVIDER: Leandrew Koyanagi, MD  REFERRING DIAG: [phone] (ICD-10-CM) - Primary osteoarthritis of left knee  THERAPY DIAG:  Acute pain of left knee  Difficulty in walking, not elsewhere classified  Localized edema  Stiffness of left knee, not elsewhere classified  Rationale for Evaluation and Treatment: Rehabilitation  ONSET DATE: 01/05/2023  SUBJECTIVE:   SUBJECTIVE STATEMENT:  I felt good after yesterday, went home and got on the CPM again. Just trying to keep up with the process.   PERTINENT  HISTORY: Hospital Course: Craig House is an 62 y.o. male who was admitted 01/09/2023 for operative treatment ofPrimary osteoarthritis of left knee. Patient has severe unremitting pain that affects sleep, daily activities, and work/hobbies. After pre-op clearance the patient was taken to the operating room on 01/09/2023 and underwent  Procedure(s): LEFT TOTAL KNEE ARTHROPLASTY.   PAIN:  Are you having pain? Yes: NPRS scale: 3/10 Pain location: L knee  Pain description: aching throb, stiffness, "its always there"   Aggravating factors: stretching and bending  Relieving factors: ice   PRECAUTIONS: Fall  WEIGHT BEARING RESTRICTIONS: No  FALLS:  Has patient fallen in last 6 months? 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   PLOF: Independent, Independent with basic ADLs, Independent with gait, and Independent with transfers  PATIENT GOALS: get movement and mobility back   NEXT MD VISIT: Referring in 4 weeks   OBJECTIVE:   DIAGNOSTIC FINDINGS:   PATIENT SURVEYS:  FOTO 72  COGNITION: Overall cognitive status: Within functional limits for tasks assessed  SENSATION: Not tested  EDEMA:   Appropriate for post-op state   MUSCLE LENGTH:    POSTURE:   PALPATION:   LOWER EXTREMITY ROM:  Active ROM = A Passive ROM = P Right eval Left eval Left 01/30/23 Left 02/03/23 Left 02/06/23  Hip flexion       Hip extension       Hip abduction       Hip adduction       Hip internal rotation       Hip external rotation       Knee flexion  73* A: 80 P: 85 Active 92 A: 90 P: 95  Knee extension  15* A: 12 P: 10 -12 A: 12 P: 10  Ankle dorsiflexion       Ankle  plantarflexion       Ankle inversion       Ankle eversion        (Blank rows = not tested)  LOWER EXTREMITY MMT:  MMT Right eval Left eval  Hip flexion 4+ 3+ pain limited   Hip extension    Hip abduction    Hip adduction    Hip internal rotation    Hip external rotation    Knee flexion 4- 3+  Knee extension 5 4-  Ankle dorsiflexion 5 5  Ankle plantarflexion    Ankle inversion    Ankle eversion     (Blank rows = not tested)  LOWER EXTREMITY SPECIAL TESTS:    FUNCTIONAL TESTS:  Eval:  Timed up and go (TUG): 23 seconds RW  3 minute walk test: 359ft with RW   GAIT: Distance walked: 37ft  Assistive device utilized: Environmental consultant - 2 wheeled Level of assistance: Modified independence Comments: flexed at hips, antalgic with less wt shift to left, limited ROM in L knee in stance and swing phases    TODAY'S TREATMENT:                                                                                                                              DATE:   02/08/23  TherEx  Scifit bike seat 7 full rotations progressing to seat 6 x7 minutes Self knee flexion stretch on 8 inch box 10x10 second holds  HS stretches 3x30 seconds 8 inch box  LLE SAQs x15 with 5#, 3 second holds Bridges with progressive knee flexion 3x5     Manual  Knee flexion mobs grade III tib on femur, progressive knee flexion 4 rounds  Knee flexion AAROM at edge of mat table     02/07/23  TherEx  Scifit bike seat 7 full rotations x6 minutes, progressed to seat 6 for last minute of time Knee extension stretch 5# x3 minutes SAQs 5# x10 with 3 second holds  SLRs + quad set x15 cues for quality of movement/quad recruitment  LAQs 5# 1x10 with 3 second holds  Standing TKEs x10 with 3 second holds    Manual  Knee extension  overpressure with heel prop as able,x10 mixed depth/pressure as pt resisted on some reps Knee flexion AAROM seated x10       02/06/23:  TherEx:  Recumbent bike seat 7, partial  revolutions to full revolutions x 10 minutes Calf stretch on slant board: x 60 seconds Step ups on 6 inch step x 15 c single UE support Lateral straddle step on 6 inch step x 15 leading with left c UE support Seated SLR:2  x 10  Seated LAQ: 4# 2 x 10  Leg Press: 75# 2 x 10, Left LE only 31# 2 x 10 Manual:  Seated knee flexion PROM  Overpressure with extension PROM Modalities:  Vasopneumatic: 34 deg, x  10 minutes, med compression      02/03/2023: NuStep Level 6 for 6 minutes (3 minutes extension emphasis and 3 minutes flexion emphasis) Tailgate knee flexion 1 minute AAROM left knee flexion 10X 10 seconds with PT overpressure Quadriceps sets with left heel prop 2 sets of 10 for 5 seconds Modified Thomas stretch for left hip flexors 4X 20 seconds  Seated knee extension stretch 5 minutes (left leg in chair) 3 minutes with 5# Attempted left knee extension stretch prone but we were limited by left hip flexors tightness (moved to the chair)  Vaso left knee Medium 10 minutes 34*   02/01/23:  TherEx:  Nustep: level 6 x 6 minutes  Sit to stands with UE support X 10 reps Gait training with SPC 50 feet X 2, cues and demo for technique. Step ups on 6 inch step x 10 c single UE support Seated SLR:2  x 10  Seated LAQ: 2# 2 x 10  Seated knee flexion AAROM 5 sec X 15 Manual:  Seated knee flexion PROM with over pressures Overpressure with extension PROM Modalities:  Vasopneumatic: 34 deg, c 10 minutes, med compression      PATIENT EDUCATION:  Education details: POC, HEP, exam findings, edema management, general progression through PT after TKR, elevation strategies at home Person educated: Patient and significant other  Education method: Explanation, Demonstration, and Handouts Education comprehension: verbalized understanding, returned demonstration, and needs further education  HOME EXERCISE PROGRAM: Access Code: [email or web address] URL: [email or web address] Date:  02/03/2023 Prepared by: Vista Mink  Exercises - Seated Long Arc Quad  - 4-5 x daily - 7 x weekly - 2 sets - 10 reps - 3-5 sec hold - Seated Small Alternating Straight Leg Lifts with Heel Touch  - 4-5 x daily - 7 x weekly - 2 sets - 10 reps - Supine Heel Slide with Strap  - 4-5 x daily - 7 x weekly - 10 reps - 5 seconds hold - Sit to Stand with Counter Support  - 4-5 x daily - 7 x weekly - 2 sets - 10 reps - Heel Raises with Counter Support  - 4-5 x daily - 7 x weekly - 2 sets - 10 reps - Supine Quadricep Sets  - 5 x daily - 7 x weekly - 2 sets - 10 reps - 5 second hold - Seated Passive Knee Extension  - 3 x daily - 7 x weekly - 1 sets - 1 reps - 3-5 minutes hold  ASSESSMENT:  CLINICAL IMPRESSION:   Randall Hiss arrives today doing well, felt good after last session. We continued working on ROM and strength as able and tolerated, also continued with manual techniques as able today too. Still tends to guard quite a bit especially with manual for knee extension, we will continue to work  on this. Making steady progress.     OBJECTIVE IMPAIRMENTS: Abnormal gait, decreased activity tolerance, decreased balance, decreased knowledge of use of DME, decreased mobility, difficulty walking, decreased ROM, decreased strength, hypomobility, increased edema, increased fascial restrictions, impaired flexibility, and pain.   ACTIVITY LIMITATIONS: standing, squatting, stairs, transfers, and locomotion level  PARTICIPATION LIMITATIONS: driving, shopping, community activity, occupation, and yard work  PERSONAL FACTORS: Age, Behavior pattern, Education, Fitness, Past/current experiences, and Time since onset of injury/illness/exacerbation are also affecting patient's functional outcome.   REHAB POTENTIAL: Good  CLINICAL DECISION MAKING: Stable/uncomplicated  EVALUATION COMPLEXITY: Low   GOALS: Goals reviewed with patient? Yes  SHORT TERM GOALS: Target date: 02/21/2023   Will be compliant with progressive  appropriate HEP  Baseline: Goal status: MET 02/06/23  2.  L knee extension AROM to be no more than 5* and flexion AROM to be at least 90* Baseline:  Goal status: Partially Met 02/03/2023  3.  Will be able to ambulate in home distances with LRAD without significant gait deviation Baseline:  Goal status: On Going 02/06/2023 using St. cane  4.  Will demonstrate equal weight bearing BLEs with functional transfers and no offshift from L LE  Baseline:  Goal status: On-going 02/06/23    LONG TERM GOALS: Target date: 03/21/2023    MMT to have improved by at least 1 grade in all weak groups  Baseline:  Goal status: INITIAL  2.  L knee flexion AROM to be at least 105*  Baseline:  Goal status: INITIAL  3.  Will be able to ambulate at least 410ft with LRAD in 3MWT to show improved functional mobility  Baseline:  Goal status: INITIAL  4.  Will be able to complete TUG in 13 seconds or less with LRAD in order to show improved functional mobility and balance  Baseline:  Goal status: INITIAL  5.  Will be able to reciprocal ascend/descend 7 steps with no increase in pain and U rail to improve home and community access  Baseline:  Goal status: INITIAL    PLAN:  PT FREQUENCY:  3x/week for first 4 weeks, then 2x/week for next 4 weeks   PT DURATION: 8 weeks  PLANNED INTERVENTIONS: Therapeutic exercises, Therapeutic activity, Neuromuscular re-education, Balance training, Gait training, Patient/Family education, Self Care, Joint mobilization, Stair training, DME instructions, Aquatic Therapy, Dry Needling, Electrical stimulation, Cryotherapy, Moist heat, Taping, Vasopneumatic device, Ultrasound, Ionotophoresis 4mg /ml Dexamethasone, Manual therapy, and Re-evaluation  PLAN FOR NEXT SESSION: Gait training with SPC,  otherwise typical care for TKR including ROM (extension emphasis) and strength, may benefit from manual heavy approach.   Deniece Ree PT DPT PN2

## 2023-02-14 ENCOUNTER — Encounter: Payer: Self-pay | Admitting: Physical Therapy

## 2023-02-14 ENCOUNTER — Ambulatory Visit (INDEPENDENT_AMBULATORY_CARE_PROVIDER_SITE_OTHER): Payer: Managed Care, Other (non HMO) | Admitting: Physical Therapy

## 2023-02-14 DIAGNOSIS — R262 Difficulty in walking, not elsewhere classified: Secondary | ICD-10-CM | POA: Diagnosis not present

## 2023-02-14 DIAGNOSIS — M25662 Stiffness of left knee, not elsewhere classified: Secondary | ICD-10-CM | POA: Diagnosis not present

## 2023-02-14 DIAGNOSIS — R600 Localized edema: Secondary | ICD-10-CM | POA: Diagnosis not present

## 2023-02-14 DIAGNOSIS — M25562 Pain in left knee: Secondary | ICD-10-CM

## 2023-02-14 NOTE — Therapy (Signed)
OUTPATIENT PHYSICAL THERAPY LOWER EXTREMITY  TREATMENT NOTE   Patient Name: Craig House MRN: [id number] DOB:05/27/1961, 62 y.o., male Today's Date: 02/14/2023  END OF SESSION:  PT End of Session - 02/14/23 1407     Visit Number 8    Number of Visits 21    Date for PT Re-Evaluation 03/21/23    Authorization Type [employer]    Authorization - Number of Visits 28    PT Start Time [id number]    PT Stop Time 1428    PT Time Calculation (min) 41 min    Activity Tolerance Patient tolerated treatment well    Behavior During Therapy [facility] for tasks assessed/performed                  Past Medical History:  Diagnosis Date   Allergy    spring   Anemia    Asthma    Diabetes mellitus without complication    type II   Hyperplastic colon polyp    Hypertension    Obesity    Past Surgical History:  Procedure Laterality Date   KNEE ARTHROSCOPY Bilateral    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   Procedure: LEFT TOTAL KNEE ARTHROPLASTY;  Surgeon: Leandrew Koyanagi, MD;  Location: [facility];  Service: Orthopedics;  Laterality: Left;   Patient Active Problem List   Diagnosis Date Noted   Primary osteoarthritis of left knee 01/09/2023   Status post total left knee replacement 01/09/2023   Bilateral primary osteoarthritis of knee 06/19/2018   Mild intermittent asthma, uncomplicated [id number]   HTN (hypertension) 12/31/2011    PCP: Elise Benne   REFERRING PROVIDER: Leandrew Koyanagi, MD  REFERRING DIAG: [id number] (ICD-10-CM) - Primary osteoarthritis of left knee  THERAPY DIAG:  Acute pain of left knee  Localized edema  Stiffness of left knee, not elsewhere classified  Difficulty in walking, not elsewhere classified  Rationale for Evaluation and Treatment: Rehabilitation  ONSET DATE: 01/05/2023  SUBJECTIVE:   SUBJECTIVE STATEMENT:  Feeling a lot better, doing HEP every day but I feel like the knee is still not flexible enough. Bending is harder than straightening.  Everything is pretty good.   PERTINENT HISTORY: Hospital Course: Craig House is an 62 y.o. male who was admitted 01/09/2023 for operative treatment ofPrimary osteoarthritis of left knee. Patient has severe unremitting pain that affects sleep, daily activities, and work/hobbies. After pre-op clearance the patient was taken to the operating room on 01/09/2023 and underwent  Procedure(s): LEFT TOTAL KNEE ARTHROPLASTY.   PAIN:  Are you having pain? Yes: NPRS scale: 2/10 Pain location: L knee  Pain description: aching throb, stiffness, "its always there"   Aggravating factors: stretching and bending  Relieving factors: ice   PRECAUTIONS: Fall  WEIGHT BEARING RESTRICTIONS: No  FALLS:  Has patient fallen in last 6 months? 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   PLOF: Independent, Independent with basic ADLs, Independent with gait, and Independent with transfers  PATIENT GOALS: get movement and mobility back   NEXT MD VISIT: Referring in 4 weeks   OBJECTIVE:   DIAGNOSTIC FINDINGS:   PATIENT SURVEYS:  FOTO 72  COGNITION: Overall cognitive status: Within functional  limits for tasks assessed     SENSATION: Not tested  EDEMA:   Appropriate for post-op state   MUSCLE LENGTH:    POSTURE:   PALPATION:   LOWER EXTREMITY ROM:  Active ROM = A Passive ROM = P Right eval Left eval Left 01/30/23 Left 02/03/23 Left 02/06/23 Left 02/14/23  Hip flexion        Hip extension        Hip abduction        Hip adduction        Hip internal rotation        Hip external rotation        Knee flexion  73* A: 80 P: 85 Active 92 A: 90 P: 95 AAROM: 90*, A: 80*  Knee  extension  15* A: 12 P: 10 -12 A: 12 P: 10 A 5*  Ankle dorsiflexion        Ankle plantarflexion        Ankle inversion        Ankle eversion         (Blank rows = not tested)  LOWER EXTREMITY MMT:  MMT Right eval Left eval  Hip flexion 4+ 3+ pain limited   Hip extension    Hip abduction    Hip adduction    Hip internal rotation    Hip external rotation    Knee flexion 4- 3+  Knee extension 5 4-  Ankle dorsiflexion 5 5  Ankle plantarflexion    Ankle inversion    Ankle eversion     (Blank rows = not tested)  LOWER EXTREMITY SPECIAL TESTS:    FUNCTIONAL TESTS:  Eval:  Timed up and go (TUG): 23 seconds RW  3 minute walk test: 362ft with RW   GAIT: Distance walked: 365ft  Assistive device utilized: Environmental consultant - 2 wheeled Level of assistance: Modified independence Comments: flexed at hips, antalgic with less wt shift to left, limited ROM in L knee in stance and swing phases    TODAY'S TREATMENT:                                                                                                                              DATE:   02/14/23  TherEx  SciFit bike seat 7 progressing to seat 6,  full rotations x6 minutes Self knee flexion stretch 15 x5 second holds  Discussed floor bikes on [employer]/use at home to keep working on ROM, also discussed use of tennis ball massage to help with quad spasms  Knee flexion self stretch 10x10 second holds 8 inch box  Quad stretch supine at edge of table 4x30 seconds Single leg LE press 50# + 5 second flexion stretch for ROM LLE 5x10         02/08/23  TherEx  Scifit bike seat 7 full rotations progressing to seat 6 x7 minutes Self knee flexion stretch on 8 inch box 10x10 second holds  HS stretches 3x30 seconds  8 inch box  LLE SAQs x15 with 5#, 3 second holds Bridges with progressive knee flexion 3x5     Manual  Knee flexion mobs grade III tib on femur, progressive knee flexion 4 rounds  Knee flexion AAROM at edge of mat  table     02/07/23  TherEx  Scifit bike seat 7 full rotations x6 minutes, progressed to seat 6 for last minute of time Knee extension stretch 5# x3 minutes SAQs 5# x10 with 3 second holds  SLRs + quad set x15 cues for quality of movement/quad recruitment  LAQs 5# 1x10 with 3 second holds  Standing TKEs x10 with 3 second holds    Manual  Knee extension overpressure with heel prop as able,x10 mixed depth/pressure as pt resisted on some reps Knee flexion AAROM seated x10       02/06/23:  TherEx:  Recumbent bike seat 7, partial revolutions to full revolutions x 10 minutes Calf stretch on slant board: x 60 seconds Step ups on 6 inch step x 15 c single UE support Lateral straddle step on 6 inch step x 15 leading with left c UE support Seated SLR:2  x 10  Seated LAQ: 4# 2 x 10  Leg Press: 75# 2 x 10, Left LE only 31# 2 x 10 Manual:  Seated knee flexion PROM  Overpressure with extension PROM Modalities:  Vasopneumatic: 34 deg, x  10 minutes, med compression      02/03/2023: NuStep Level 6 for 6 minutes (3 minutes extension emphasis and 3 minutes flexion emphasis) Tailgate knee flexion 1 minute AAROM left knee flexion 10X 10 seconds with PT overpressure Quadriceps sets with left heel prop 2 sets of 10 for 5 seconds Modified Thomas stretch for left hip flexors 4X 20 seconds  Seated knee extension stretch 5 minutes (left leg in chair) 3 minutes with 5# Attempted left knee extension stretch prone but we were limited by left hip flexors tightness (moved to the chair)  Vaso left knee Medium 10 minutes 34*   02/01/23:  TherEx:  Nustep: level 6 x 6 minutes  Sit to stands with UE support X 10 reps Gait training with SPC 50 feet X 2, cues and demo for technique. Step ups on 6 inch step x 10 c single UE support Seated SLR:2  x 10  Seated LAQ: 2# 2 x 10  Seated knee flexion AAROM 5 sec X 15 Manual:  Seated knee flexion PROM with over pressures Overpressure with extension  PROM Modalities:  Vasopneumatic: 34 deg, c 10 minutes, med compression      PATIENT EDUCATION:  Education details: POC, HEP, exam findings, edema management, general progression through PT after TKR, elevation strategies at home Person educated: Patient and significant other  Education method: Explanation, Demonstration, and Handouts Education comprehension: verbalized understanding, returned demonstration, and needs further education  HOME EXERCISE PROGRAM: Access Code: [email or web address] URL: [email or web address] Date: 02/03/2023 Prepared by: Vista Mink  Exercises - Seated Long Arc Quad  - 4-5 x daily - 7 x weekly - 2 sets - 10 reps - 3-5 sec hold - Seated Small Alternating Straight Leg Lifts with Heel Touch  - 4-5 x daily - 7 x weekly - 2 sets - 10 reps - Supine Heel Slide with Strap  - 4-5 x daily - 7 x weekly - 10 reps - 5 seconds hold - Sit to Stand with Counter Support  - 4-5 x daily - 7 x weekly - 2 sets - 10 reps - Heel Raises with  Counter Support  - 4-5 x daily - 7 x weekly - 2 sets - 10 reps - Supine Quadricep Sets  - 5 x daily - 7 x weekly - 2 sets - 10 reps - 5 second hold - Seated Passive Knee Extension  - 3 x daily - 7 x weekly - 1 sets - 1 reps - 3-5 minutes hold  ASSESSMENT:  CLINICAL IMPRESSION:   Randall Hiss arrives today doing well, feeling good but still a bit frustrated that its taking a bit of time to improve knee flexion ROM. Got new ROM measurements today, otherwise continued with focus on flexion ROM and quad strengthening. Gait mechanics are improving. Will continue to progress as able/appropriate, ROM still staying a bit stiff.     OBJECTIVE IMPAIRMENTS: Abnormal gait, decreased activity tolerance, decreased balance, decreased knowledge of use of DME, decreased mobility, difficulty walking, decreased ROM, decreased strength, hypomobility, increased edema, increased fascial restrictions, impaired flexibility, and pain.   ACTIVITY LIMITATIONS:  standing, squatting, stairs, transfers, and locomotion level  PARTICIPATION LIMITATIONS: driving, shopping, community activity, occupation, and yard work  PERSONAL FACTORS: Age, Behavior pattern, Education, Fitness, Past/current experiences, and Time since onset of injury/illness/exacerbation are also affecting patient's functional outcome.   REHAB POTENTIAL: Good  CLINICAL DECISION MAKING: Stable/uncomplicated  EVALUATION COMPLEXITY: Low   GOALS: Goals reviewed with patient? Yes  SHORT TERM GOALS: Target date: 02/21/2023   Will be compliant with progressive appropriate HEP  Baseline: Goal status: MET 02/06/23  2.  L knee extension AROM to be no more than 5* and flexion AROM to be at least 90* Baseline:  Goal status: Partially Met 02/03/2023  3.  Will be able to ambulate in home distances with LRAD without significant gait deviation Baseline:  Goal status: On Going 02/06/2023 using St. cane  4.  Will demonstrate equal weight bearing BLEs with functional transfers and no offshift from L LE  Baseline:  Goal status: On-going 02/06/23    LONG TERM GOALS: Target date: 03/21/2023    MMT to have improved by at least 1 grade in all weak groups  Baseline:  Goal status: INITIAL  2.  L knee flexion AROM to be at least 105*  Baseline:  Goal status: INITIAL  3.  Will be able to ambulate at least 444ft with LRAD in 3MWT to show improved functional mobility  Baseline:  Goal status: INITIAL  4.  Will be able to complete TUG in 13 seconds or less with LRAD in order to show improved functional mobility and balance  Baseline:  Goal status: INITIAL  5.  Will be able to reciprocal ascend/descend 7 steps with no increase in pain and U rail to improve home and community access  Baseline:  Goal status: INITIAL    PLAN:  PT FREQUENCY:  3x/week for first 4 weeks, then 2x/week for next 4 weeks   PT DURATION: 8 weeks  PLANNED INTERVENTIONS: Therapeutic exercises, Therapeutic activity,  Neuromuscular re-education, Balance training, Gait training, Patient/Family education, Self Care, Joint mobilization, Stair training, DME instructions, Aquatic Therapy, Dry Needling, Electrical stimulation, Cryotherapy, Moist heat, Taping, Vasopneumatic device, Ultrasound, Ionotophoresis 4mg /ml Dexamethasone, Manual therapy, and Re-evaluation  PLAN FOR NEXT SESSION: Gait training with SPC,  otherwise typical care for TKR including ROM (extension emphasis) and strength, may benefit from manual heavy approach. Increase focus on quad strengthening.   Deniece Ree PT DPT PN2

## 2023-02-15 ENCOUNTER — Ambulatory Visit: Payer: Managed Care, Other (non HMO) | Admitting: Physical Therapy

## 2023-02-15 ENCOUNTER — Encounter: Payer: Self-pay | Admitting: Physical Therapy

## 2023-02-15 DIAGNOSIS — M25562 Pain in left knee: Secondary | ICD-10-CM | POA: Diagnosis not present

## 2023-02-15 DIAGNOSIS — R262 Difficulty in walking, not elsewhere classified: Secondary | ICD-10-CM | POA: Diagnosis not present

## 2023-02-15 DIAGNOSIS — M25662 Stiffness of left knee, not elsewhere classified: Secondary | ICD-10-CM

## 2023-02-15 DIAGNOSIS — R600 Localized edema: Secondary | ICD-10-CM

## 2023-02-15 NOTE — Therapy (Signed)
OUTPATIENT PHYSICAL THERAPY LOWER EXTREMITY  TREATMENT NOTE   Patient Name: Craig House MRN: [id number] DOB:1961-06-27, 62 y.o., male Today's Date: 02/15/2023  END OF SESSION:  PT End of Session - 02/15/23 1428     Visit Number 9    Number of Visits 21    Date for PT Re-Evaluation 03/21/23    Authorization Type [employer]    Authorization - Number of Visits 20    PT Start Time 1430    PT Stop Time 1510    PT Time Calculation (min) 40 min    Activity Tolerance Patient tolerated treatment well    Behavior During Therapy [facility] for tasks assessed/performed                   Past Medical History:  Diagnosis Date   Allergy    spring   Anemia    Asthma    Diabetes mellitus without complication    type II   Hyperplastic colon polyp    Hypertension    Obesity    Past Surgical History:  Procedure Laterality Date   KNEE ARTHROSCOPY Bilateral    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   Procedure: LEFT TOTAL KNEE ARTHROPLASTY;  Surgeon: Leandrew Koyanagi, MD;  Location: [place];  Service: Orthopedics;  Laterality: Left;   Patient Active Problem List   Diagnosis Date Noted   Primary osteoarthritis of left knee 01/09/2023   Status post total left knee replacement 01/09/2023   Bilateral primary osteoarthritis of knee 06/19/2018   Mild intermittent asthma, uncomplicated [id number]   HTN (hypertension) 12/31/2011    PCP: Elise Benne   REFERRING PROVIDER: Leandrew Koyanagi, MD  REFERRING DIAG: [phone] (ICD-10-CM) - Primary osteoarthritis of left knee  THERAPY DIAG:  Acute pain of left knee  Localized edema  Stiffness of left knee, not elsewhere classified  Difficulty in walking, not elsewhere classified  Rationale for Evaluation and Treatment: Rehabilitation  ONSET DATE: 01/05/2023  SUBJECTIVE:   SUBJECTIVE STATEMENT:  Feeling good today and after session yesterday. Slept better last night.   PERTINENT HISTORY: Hospital Course: Craig House is  an 62 y.o. male who was admitted 01/09/2023 for operative treatment ofPrimary osteoarthritis of left knee. Patient has severe unremitting pain that affects sleep, daily activities, and work/hobbies. After pre-op clearance the patient was taken to the operating room on 01/09/2023 and underwent  Procedure(s): LEFT TOTAL KNEE ARTHROPLASTY.   PAIN:  Are you having pain? Yes: NPRS scale: 2/10 Pain location: L knee  Pain description: aching throb, stiffness, "its always there"   Aggravating factors: stretching and bending  Relieving factors: ice   PRECAUTIONS: Fall  WEIGHT BEARING RESTRICTIONS: No  FALLS:  Has patient fallen in last 6 months? 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   PLOF: Independent, Independent with basic ADLs, Independent with gait, and Independent with transfers  PATIENT GOALS: get movement and mobility back   NEXT MD VISIT: Referring in 4 weeks   OBJECTIVE:   DIAGNOSTIC FINDINGS:   PATIENT SURVEYS:  FOTO 26  COGNITION: Overall cognitive status: Within functional limits for tasks assessed     SENSATION: Not tested  EDEMA:   Appropriate  for post-op state   MUSCLE LENGTH:    POSTURE:   PALPATION:   LOWER EXTREMITY ROM:  Active ROM = A Passive ROM = P Right eval Left eval Left 01/30/23 Left 02/03/23 Left 02/06/23 Left 02/14/23  Hip flexion        Hip extension        Hip abduction        Hip adduction        Hip internal rotation        Hip external rotation        Knee flexion  73* A: 80 P: 85 Active 92 A: 90 P: 95 AAROM: 90*, A: 80*  Knee extension  15* A: 12 P: 10 -12 A: 12 P: 10 A 5*  Ankle dorsiflexion        Ankle  plantarflexion        Ankle inversion        Ankle eversion         (Blank rows = not tested)  LOWER EXTREMITY MMT:  MMT Right eval Left eval  Hip flexion 4+ 3+ pain limited   Hip extension    Hip abduction    Hip adduction    Hip internal rotation    Hip external rotation    Knee flexion 4- 3+  Knee extension 5 4-  Ankle dorsiflexion 5 5  Ankle plantarflexion    Ankle inversion    Ankle eversion     (Blank rows = not tested)  LOWER EXTREMITY SPECIAL TESTS:    FUNCTIONAL TESTS:  Eval:  Timed up and go (TUG): 23 seconds RW  3 minute walk test: 323ft with RW   GAIT: Distance walked: 364ft  Assistive device utilized: Environmental consultant - 2 wheeled Level of assistance: Modified independence Comments: flexed at hips, antalgic with less wt shift to left, limited ROM in L knee in stance and swing phases    TODAY'S TREATMENT:                                                                                                                              DATE:   02/15/23  TherEx  Scifit bike seat 7 progressing to seat 6 x6 minutes Quad + hip flexor stretch 4x30 seconds SAQs 7# x12 with 5 second holds Shuttle LE press L LE 50# x10 with 5  second flexion stretch    Manual  Tib on femur grade III mobs for flexion 5 rounds Knee flexion AAROM x10 edge of mat table    02/14/23  TherEx  SciFit bike seat 7 progressing to seat 6,  full rotations x6 minutes Self knee flexion stretch 15 x5 second holds  Discussed floor bikes on [employer]/use at home to keep working on ROM, also discussed use of tennis ball massage to help with quad spasms  Knee flexion self stretch 10x10 second holds 8 inch box  Quad stretch supine at edge of table 4x30 seconds Single leg LE  press 50# + 5 second flexion stretch for ROM LLE 5x10         02/08/23  TherEx  Scifit bike seat 7 full rotations progressing to seat 6 x7 minutes Self knee flexion stretch on 8 inch box 10x10 second holds  HS stretches  3x30 seconds 8 inch box  LLE SAQs x15 with 5#, 3 second holds Bridges with progressive knee flexion 3x5     Manual  Knee flexion mobs grade III tib on femur, progressive knee flexion 4 rounds  Knee flexion AAROM at edge of mat table     02/07/23  TherEx  Scifit bike seat 7 full rotations x6 minutes, progressed to seat 6 for last minute of time Knee extension stretch 5# x3 minutes SAQs 5# x10 with 3 second holds  SLRs + quad set x15 cues for quality of movement/quad recruitment  LAQs 5# 1x10 with 3 second holds  Standing TKEs x10 with 3 second holds    Manual  Knee extension overpressure with heel prop as able,x10 mixed depth/pressure as pt resisted on some reps Knee flexion AAROM seated x10       02/06/23:  TherEx:  Recumbent bike seat 7, partial revolutions to full revolutions x 10 minutes Calf stretch on slant board: x 60 seconds Step ups on 6 inch step x 15 c single UE support Lateral straddle step on 6 inch step x 15 leading with left c UE support Seated SLR:2  x 10  Seated LAQ: 4# 2 x 10  Leg Press: 75# 2 x 10, Left LE only 31# 2 x 10 Manual:  Seated knee flexion PROM  Overpressure with extension PROM Modalities:  Vasopneumatic: 34 deg, x  10 minutes, med compression      02/03/2023: NuStep Level 6 for 6 minutes (3 minutes extension emphasis and 3 minutes flexion emphasis) Tailgate knee flexion 1 minute AAROM left knee flexion 10X 10 seconds with PT overpressure Quadriceps sets with left heel prop 2 sets of 10 for 5 seconds Modified Thomas stretch for left hip flexors 4X 20 seconds  Seated knee extension stretch 5 minutes (left leg in chair) 3 minutes with 5# Attempted left knee extension stretch prone but we were limited by left hip flexors tightness (moved to the chair)  Vaso left knee Medium 10 minutes 34*   02/01/23:  TherEx:  Nustep: level 6 x 6 minutes  Sit to stands with UE support X 10 reps Gait training with SPC 50 feet X 2, cues and demo  for technique. Step ups on 6 inch step x 10 c single UE support Seated SLR:2  x 10  Seated LAQ: 2# 2 x 10  Seated knee flexion AAROM 5 sec X 15 Manual:  Seated knee flexion PROM with over pressures Overpressure with extension PROM Modalities:  Vasopneumatic: 34 deg, c 10 minutes, med compression      PATIENT EDUCATION:  Education details: POC, HEP, exam findings, edema management, general progression through PT after TKR, elevation strategies at home Person educated: Patient and significant other  Education method: Explanation, Demonstration, and Handouts Education comprehension: verbalized understanding, returned demonstration, and needs further education  HOME EXERCISE PROGRAM: Access Code: [email or web address] URL: [email or web address] Date: 02/03/2023 Prepared by: Vista Mink  Exercises - Seated Long Arc Quad  - 4-5 x daily - 7 x weekly - 2 sets - 10 reps - 3-5 sec hold - Seated Small Alternating Straight Leg Lifts with Heel Touch  - 4-5 x daily - 7 x weekly - 2 sets -  10 reps - Supine Heel Slide with Strap  - 4-5 x daily - 7 x weekly - 10 reps - 5 seconds hold - Sit to Stand with Counter Support  - 4-5 x daily - 7 x weekly - 2 sets - 10 reps - Heel Raises with Counter Support  - 4-5 x daily - 7 x weekly - 2 sets - 10 reps - Supine Quadricep Sets  - 5 x daily - 7 x weekly - 2 sets - 10 reps - 5 second hold - Seated Passive Knee Extension  - 3 x daily - 7 x weekly - 1 sets - 1 reps - 3-5 minutes hold  ASSESSMENT:  CLINICAL IMPRESSION:    Randall Hiss arrives today doing well, no major changes since session yesterday. Continued work on ROM and strength as appropriate. Does have a small area on medial patella that feels like a very small BB, tender to touch but not mobile- I'm not sure what this is, will monitor and educated pt to call MD if this worsens.  Will plan on 10th visit progress note next session.    OBJECTIVE IMPAIRMENTS: Abnormal gait, decreased activity  tolerance, decreased balance, decreased knowledge of use of DME, decreased mobility, difficulty walking, decreased ROM, decreased strength, hypomobility, increased edema, increased fascial restrictions, impaired flexibility, and pain.   ACTIVITY LIMITATIONS: standing, squatting, stairs, transfers, and locomotion level  PARTICIPATION LIMITATIONS: driving, shopping, community activity, occupation, and yard work  PERSONAL FACTORS: Age, Behavior pattern, Education, Fitness, Past/current experiences, and Time since onset of injury/illness/exacerbation are also affecting patient's functional outcome.   REHAB POTENTIAL: Good  CLINICAL DECISION MAKING: Stable/uncomplicated  EVALUATION COMPLEXITY: Low   GOALS: Goals reviewed with patient? Yes  SHORT TERM GOALS: Target date: 02/21/2023   Will be compliant with progressive appropriate HEP  Baseline: Goal status: MET 02/06/23  2.  L knee extension AROM to be no more than 5* and flexion AROM to be at least 90* Baseline:  Goal status: Partially Met 02/03/2023  3.  Will be able to ambulate in home distances with LRAD without significant gait deviation Baseline:  Goal status: On Going 02/06/2023 using St. cane  4.  Will demonstrate equal weight bearing BLEs with functional transfers and no offshift from L LE  Baseline:  Goal status: On-going 02/06/23    LONG TERM GOALS: Target date: 03/21/2023    MMT to have improved by at least 1 grade in all weak groups  Baseline:  Goal status: INITIAL  2.  L knee flexion AROM to be at least 105*  Baseline:  Goal status: INITIAL  3.  Will be able to ambulate at least 415ft with LRAD in 3MWT to show improved functional mobility  Baseline:  Goal status: INITIAL  4.  Will be able to complete TUG in 13 seconds or less with LRAD in order to show improved functional mobility and balance  Baseline:  Goal status: INITIAL  5.  Will be able to reciprocal ascend/descend 7 steps with no increase in pain and U  rail to improve home and community access  Baseline:  Goal status: INITIAL    PLAN:  PT FREQUENCY:  3x/week for first 4 weeks, then 2x/week for next 4 weeks   PT DURATION: 8 weeks  PLANNED INTERVENTIONS: Therapeutic exercises, Therapeutic activity, Neuromuscular re-education, Balance training, Gait training, Patient/Family education, Self Care, Joint mobilization, Stair training, DME instructions, Aquatic Therapy, Dry Needling, Electrical stimulation, Cryotherapy, Moist heat, Taping, Vasopneumatic device, Ultrasound, Ionotophoresis 4mg /ml Dexamethasone, Manual therapy, and Re-evaluation  PLAN FOR NEXT SESSION: Gait training with SPC,  otherwise typical care for TKR including ROM (extension emphasis) and strength, may benefit from manual heavy approach. Increase focus on quad strengthening. Progress note/FOTO update   Deniece Ree PT DPT PN2

## 2023-02-17 ENCOUNTER — Encounter: Payer: Self-pay | Admitting: Physical Therapy

## 2023-02-17 ENCOUNTER — Ambulatory Visit (INDEPENDENT_AMBULATORY_CARE_PROVIDER_SITE_OTHER): Payer: Managed Care, Other (non HMO) | Admitting: Physical Therapy

## 2023-02-17 DIAGNOSIS — M25562 Pain in left knee: Secondary | ICD-10-CM | POA: Diagnosis not present

## 2023-02-17 DIAGNOSIS — M25662 Stiffness of left knee, not elsewhere classified: Secondary | ICD-10-CM | POA: Diagnosis not present

## 2023-02-17 DIAGNOSIS — R600 Localized edema: Secondary | ICD-10-CM

## 2023-02-17 DIAGNOSIS — R262 Difficulty in walking, not elsewhere classified: Secondary | ICD-10-CM

## 2023-02-17 NOTE — Therapy (Signed)
OUTPATIENT PHYSICAL THERAPY LOWER EXTREMITY  TREATMENT NOTE/PROGRESS NOTE   Patient Name: Craig House Craig House MRN: [id number] DOB:10/31/1961, 62 y.o., male Today's Date: 02/17/2023  END OF SESSION:  PT End of Session - 02/17/23 1027     Visit Number 10    Number of Visits 21    Date for PT Re-Evaluation 03/21/23    Authorization Type [employer]    Authorization - Number of Visits 90    PT Start Time 1015    PT Stop Time 1058    PT Time Calculation (min) 43 min    Activity Tolerance Patient tolerated treatment well    [facility] assessed/performed                    Past Medical History:  Diagnosis Date   Allergy    spring   Anemia    Asthma    Diabetes mellitus without complication    type II   Hyperplastic colon polyp    Hypertension    Obesity    Past Surgical History:  Procedure Laterality Date   KNEE ARTHROSCOPY Bilateral    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   Procedure: LEFT TOTAL KNEE ARTHROPLASTY;  Surgeon: Tarry KosXu, Naiping M, MD;  Location: [facility] OR;  Service: Orthopedics;  Laterality: Left;   Patient Active Problem List   Diagnosis Date Noted   Primary osteoarthritis of left knee 01/09/2023   Status post total left knee replacement 01/09/2023   Bilateral primary osteoarthritis of knee 06/19/2018   Mild intermittent asthma, uncomplicated 01/18/2016   HTN (hypertension) 12/31/2011    PCP: Marisue BrooklynSaguier, Edward PA-C   REFERRING PROVIDER: Tarry KosXu, Naiping M, MD  REFERRING DIAG: [id number] (ICD-10-CM) - Primary osteoarthritis of left knee  THERAPY DIAG:  Acute pain of left knee  Stiffness of left knee, not elsewhere classified  Difficulty in walking, not elsewhere classified  Localized edema  Rationale for Evaluation and Treatment: Rehabilitation  ONSET DATE: 01/05/2023  SUBJECTIVE:   SUBJECTIVE STATEMENT:   Doing well, I think I overdid it a bit yesterday because my leg was bleeding when I was trying to stretch it. It looked  like I busted a stitch or something, wasn't too bad. Still a little sore   PERTINENT HISTORY: Hospital Course: Craig House Craig House is an 62 y.o. male who was admitted 01/09/2023 for operative treatment ofPrimary osteoarthritis of left knee. Patient has severe unremitting pain that affects sleep, daily activities, and work/hobbies. After pre-op clearance the patient was taken to the operating room on 01/09/2023 and underwent  Procedure(s): LEFT TOTAL KNEE ARTHROPLASTY.   PAIN:  Are you having pain? Yes: NPRS scale: 3/10 Pain location: L knee  Pain description: aching throb, stiffness  Aggravating factors: stretching and bending  Relieving factors: ice   PRECAUTIONS: Fall  WEIGHT BEARING RESTRICTIONS: No  FALLS:  Has patient fallen in last 6 months? 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/moving around   PLOF: Independent, Independent with basic ADLs, Independent with gait, and Independent with transfers  PATIENT GOALS: get movement and mobility back   NEXT MD VISIT: Referring in 4 weeks   OBJECTIVE:   DIAGNOSTIC FINDINGS:   PATIENT SURVEYS:  FOTO 43  COGNITION: Overall cognitive status: Within functional limits for tasks assessed     SENSATION: Not tested  EDEMA:   Appropriate for post-op state   MUSCLE LENGTH:    POSTURE:   PALPATION:   LOWER EXTREMITY ROM:  Active ROM = A Passive ROM = P Right eval Left eval Left 01/30/23 Left 02/03/23 Left 02/06/23 Left 02/14/23 Left 02/17/23  Hip flexion         Hip extension         Hip abduction         Hip adduction         Hip internal rotation         Hip external rotation         Knee flexion  73* A: 80 P: 85 Active 92  A: 90 P: 95 AAROM: 90*, A: 80* AAROM 90*, AROM 80*  Knee extension  15* A: 12 P: 10 -12 A: 12 P: 10 A 5* A supine 12*  Ankle dorsiflexion         Ankle plantarflexion         Ankle inversion         Ankle eversion          (Blank rows = not tested)  LOWER EXTREMITY MMT:  MMT Right eval Left eval R  02/17/23 L 02/17/23  Hip flexion 4+ 3+ pain limited  4+ 4+  Hip extension      Hip abduction      Hip adduction      Hip internal rotation      Hip external rotation      Knee flexion 4- 3+ 4+ 4-  Knee extension 5 4- 5 4  Ankle dorsiflexion 5 5    Ankle plantarflexion      Ankle inversion      Ankle eversion       (Blank rows = not tested)  LOWER EXTREMITY SPECIAL TESTS:    FUNCTIONAL TESTS:  Eval:  Timed up and go (TUG): 23 seconds RW  3 minute walk test: 34221ft with RW    02/17/23- TUG 16.5 seconds, hurry-cane; 3MWT 46157ft hurry-cane  GAIT: Distance walked: 35921ft  Assistive device utilized: Environmental consultantWalker - 2 wheeled Level of assistance: Modified independence Comments: flexed at hips, antalgic with less wt shift to left, limited ROM in L knee in stance and swing phases    TODAY'S TREATMENT:                                                                                                                              DATE:     02/17/23   FOTO 54  Objective measures/goal review for progress note + appropriate education  TherEx  Scifit bike seat 7 progressing to seat 6 full rotations x6 minutes LAQs 7# x12 with 3 second holds  SLRs + quad set 2# x15   02/15/23  TherEx  Scifit bike seat 7 progressing to  seat 6 x6 minutes Quad + hip flexor stretch 4x30 seconds SAQs 7# x12 with 5 second holds Shuttle LE press L LE 50# x10 with 5  second flexion stretch    Manual  Tib on femur grade III mobs for flexion 5 rounds Knee flexion AAROM x10 edge of mat table    02/14/23  TherEx  SciFit bike seat 7 progressing to seat 6,  full rotations x6 minutes Self knee flexion  stretch 15 x5 second holds  Discussed floor bikes on [employer]/use at home to keep working on ROM, also discussed use of tennis ball massage to help with quad spasms  Knee flexion self stretch 10x10 second holds 8 inch box  Quad stretch supine at edge of table 4x30 seconds Single leg LE press 50# + 5 second flexion stretch for ROM LLE 5x10         02/08/23  TherEx  Scifit bike seat 7 full rotations progressing to seat 6 x7 minutes Self knee flexion stretch on 8 inch box 10x10 second holds  HS stretches 3x30 seconds 8 inch box  LLE SAQs x15 with 5#, 3 second holds Bridges with progressive knee flexion 3x5     Manual  Knee flexion mobs grade III tib on femur, progressive knee flexion 4 rounds  Knee flexion AAROM at edge of mat table     02/07/23  TherEx  Scifit bike seat 7 full rotations x6 minutes, progressed to seat 6 for last minute of time Knee extension stretch 5# x3 minutes SAQs 5# x10 with 3 second holds  SLRs + quad set x15 cues for quality of movement/quad recruitment  LAQs 5# 1x10 with 3 second holds  Standing TKEs x10 with 3 second holds    Manual  Knee extension overpressure with heel prop as able,x10 mixed depth/pressure as pt resisted on some reps Knee flexion AAROM seated x10       02/06/23:  TherEx:  Recumbent bike seat 7, partial revolutions to full revolutions x 10 minutes Calf stretch on slant board: x 60 seconds Step ups on 6 inch step x 15 c single UE support Lateral straddle step on 6 inch step x 15 leading with left c UE support Seated SLR:2  x 10  Seated LAQ: 4# 2 x 10  Leg Press: 75# 2 x 10, Left LE only 31# 2 x 10 Manual:  Seated knee flexion PROM  Overpressure with extension PROM Modalities:  Vasopneumatic: 34 deg, x  10 minutes, med compression      02/03/2023: NuStep Level 6 for 6 minutes (3 minutes extension emphasis and 3 minutes flexion emphasis) Tailgate knee flexion 1 minute AAROM left knee flexion 10X 10 seconds with  PT overpressure Quadriceps sets with left heel prop 2 sets of 10 for 5 seconds Modified Thomas stretch for left hip flexors 4X 20 seconds  Seated knee extension stretch 5 minutes (left leg in chair) 3 minutes with 5# Attempted left knee extension stretch prone but we were limited by left hip flexors tightness (moved to the chair)  Vaso left knee Medium 10 minutes 34*   02/01/23:  TherEx:  Nustep: level 6 x 6 minutes  Sit to stands with UE support X 10 reps Gait training with SPC 50 feet X 2, cues and demo for technique. Step ups on 6 inch step x 10 c single UE support Seated SLR:2  x 10  Seated LAQ: 2# 2 x 10  Seated knee flexion AAROM 5 sec X 15 Manual:  Seated knee  flexion PROM with over pressures Overpressure with extension PROM Modalities:  Vasopneumatic: 34 deg, c 10 minutes, med compression      PATIENT EDUCATION:  Education details: POC, HEP, exam findings, edema management, general progression through PT after TKR, elevation strategies at home Person educated: Patient and significant other  Education method: Explanation, Demonstration, and Handouts Education comprehension: verbalized understanding, returned demonstration, and needs further education  HOME EXERCISE PROGRAM: Access Code: [email or web address] URL: [email or web address] Date: 02/03/2023 Prepared by: Pauletta Browns  Exercises - Seated Long Arc Quad  - 4-5 x daily - 7 x weekly - 2 sets - 10 reps - 3-5 sec hold - Seated Small Alternating Straight Leg Lifts with Heel Touch  - 4-5 x daily - 7 x weekly - 2 sets - 10 reps - Supine Heel Slide with Strap  - 4-5 x daily - 7 x weekly - 10 reps - 5 seconds hold - Sit to Stand with Counter Support  - 4-5 x daily - 7 x weekly - 2 sets - 10 reps - Heel Raises with Counter Support  - 4-5 x daily - 7 x weekly - 2 sets - 10 reps - Supine Quadricep Sets  - 5 x daily - 7 x weekly - 2 sets - 10 reps - 5 second hold - Seated Passive Knee Extension  - 3 x daily - 7 x  weekly - 1 sets - 1 reps - 3-5 minutes hold  ASSESSMENT:  CLINICAL IMPRESSION:    [name] arrives today doing OK, was working on some exercises on his own and sounds like he somehow bust part of his incision open as it started to bleed- does have a small open spot but it is not actively bleeding now, he sees the MD Tuesday so will await guidance here. Focused on 10th visit progress note due to upcoming MD appt. Making slow but steady progress, ROM is still quite limited but he is not even 2 months out from procedure. Will continue steady efforts, took it easy on knee flexion AAROM due to that spot on his incision that bust at his home yesterday.    OBJECTIVE IMPAIRMENTS: Abnormal gait, decreased activity tolerance, decreased balance, decreased knowledge of use of DME, decreased mobility, difficulty walking, decreased ROM, decreased strength, hypomobility, increased edema, increased fascial restrictions, impaired flexibility, and pain.   ACTIVITY LIMITATIONS: standing, squatting, stairs, transfers, and locomotion level  PARTICIPATION LIMITATIONS: driving, shopping, community activity, occupation, and yard work  PERSONAL FACTORS: Age, Behavior pattern, Education, Fitness, Past/current experiences, and Time since onset of injury/illness/exacerbation are also affecting patient's functional outcome.   REHAB POTENTIAL: Good  CLINICAL DECISION MAKING: Stable/uncomplicated  EVALUATION COMPLEXITY: Low   GOALS: Goals reviewed with patient? Yes  SHORT TERM GOALS: Target date: 02/21/2023   Will be compliant with progressive appropriate HEP  Baseline: Goal status: MET 02/06/23  2.  L knee extension AROM to be no more than 5* and flexion AROM to be at least 90* Baseline:  Goal status: Partially Met 02/17/23  3.  Will be able to ambulate in home distances with LRAD without significant gait deviation Baseline:  Goal status: PARTIALLY MET 02/17/23- mild gait impairment   4.  Will demonstrate equal  weight bearing BLEs with functional transfers and no offshift from L LE  Baseline:  Goal status: MET 02/17/23    LONG TERM GOALS: Target date: 03/21/2023    MMT to have improved by at least 1 grade in all weak groups  Baseline:  Goal status: IN PROGRESS  02/17/23  2.  L knee flexion AROM to be at least 105*  Baseline:  Goal status: IN PROGRESS 02/17/23  3.  Will be able to ambulate at least 420ft with LRAD in to show improved functional mobility  Baseline:  Goal status: MET 02/17/23  4.  Will be able to complete TUG in 13 seconds or less with LRAD in order to show improved functional mobility and balance  Baseline:  Goal status: IN PROGRESS 02/17/23  5.  Will be able to reciprocal ascend/descend 7 steps with no increase in pain and U rail to improve home and community access  Baseline:  Goal status: IN PROGRESS4/5/24    PLAN:  PT FREQUENCY:  3x/week for first 4 weeks, then 2x/week for next 4 weeks   PT DURATION: 8 weeks  PLANNED INTERVENTIONS: Therapeutic exercises, Therapeutic activity, Neuromuscular re-education, Balance training, Gait training, Patient/Family education, Self Care, Joint mobilization, Stair training, DME instructions, Aquatic Therapy, Dry Needling, Electrical stimulation, Cryotherapy, Moist heat, Taping, Vasopneumatic device, Ultrasound, Ionotophoresis 4mg /ml Dexamethasone, Manual therapy, and Re-evaluation  PLAN FOR NEXT SESSION: Gait training with SPC,  otherwise typical care for TKR including ROM (extension and flexion) and strength, may benefit from manual heavy approach. Increase focus on quad strengthening. How is open spot on his incision?   Nedra Hai PT DPT PN2

## 2023-02-20 ENCOUNTER — Ambulatory Visit (INDEPENDENT_AMBULATORY_CARE_PROVIDER_SITE_OTHER): Payer: Managed Care, Other (non HMO) | Admitting: Physical Therapy

## 2023-02-20 DIAGNOSIS — M25662 Stiffness of left knee, not elsewhere classified: Secondary | ICD-10-CM

## 2023-02-20 DIAGNOSIS — R600 Localized edema: Secondary | ICD-10-CM

## 2023-02-20 DIAGNOSIS — R262 Difficulty in walking, not elsewhere classified: Secondary | ICD-10-CM

## 2023-02-20 DIAGNOSIS — M25562 Pain in left knee: Secondary | ICD-10-CM | POA: Diagnosis not present

## 2023-02-20 NOTE — Therapy (Signed)
OUTPATIENT PHYSICAL THERAPY LOWER EXTREMITY  TREATMENT NOTE/PROGRESS NOTE   Patient Name: Craig House MRN: [id number] DOB:19-Feb-1961, 62 y.o., male Today's Date: 02/20/2023  END OF SESSION:  PT End of Session - 02/20/23 1610     Visit Number 11    Number of Visits 21    Date for PT Re-Evaluation 03/21/23    Authorization Type [employer]    Authorization - Number of Visits 90    PT Start Time 1430    PT Stop Time 1510    PT Time Calculation (min) 40 min    Activity Tolerance Patient tolerated treatment well    Behavior During Therapy [facility] for tasks assessed/performed                     Past Medical History:  Diagnosis Date   Allergy    spring   Anemia    Asthma    Diabetes mellitus without complication    type II   Hyperplastic colon polyp    Hypertension    Obesity    Past Surgical History:  Procedure Laterality Date   KNEE ARTHROSCOPY Bilateral    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   Procedure: LEFT TOTAL KNEE ARTHROPLASTY;  Surgeon: Craig Kos, MD;  Location: [facility] OR;  Service: Orthopedics;  Laterality: Left;   Patient Active Problem List   Diagnosis Date Noted   Primary osteoarthritis of left knee 01/09/2023   Status post total left knee replacement 01/09/2023   Bilateral primary osteoarthritis of knee 06/19/2018   Mild intermittent asthma, uncomplicated 01/18/2016   HTN (hypertension) 12/31/2011    PCP: Craig House   REFERRING PROVIDER: Tarry Kos, MD  REFERRING DIAG: [phone] (ICD-10-CM) - Primary osteoarthritis of left knee  THERAPY DIAG:  Acute pain of left knee  Stiffness of left knee, not elsewhere classified  Difficulty in walking, not elsewhere classified  Localized edema  Rationale for Evaluation and Treatment: Rehabilitation  ONSET DATE: 01/05/2023  SUBJECTIVE:   SUBJECTIVE STATEMENT: Relays his knee feels stuck, he is working hard to push it at home with HEP and in PT, but feels like only has 90 deg at  best on a good day. He has an exercise bike and has been using it at home now  PERTINENT HISTORY: Hospital Course: Craig House is an 62 y.o. male who was admitted 01/09/2023 for operative treatment ofPrimary osteoarthritis of left knee. Patient has severe unremitting pain that affects sleep, daily activities, and work/hobbies. After pre-op clearance the patient was taken to the operating room on 01/09/2023 and underwent  Procedure(s): LEFT TOTAL KNEE ARTHROPLASTY.   PAIN:  Are you having pain? Yes: NPRS scale: 3/10 Pain location: L knee  Pain description: aching throb, stiffness  Aggravating factors: stretching and bending  Relieving factors: ice   PRECAUTIONS: Fall  WEIGHT BEARING RESTRICTIONS: No  FALLS:  Has patient fallen in last 6 months? 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   PLOF: Independent, Independent with basic ADLs, Independent with gait, and Independent with transfers  PATIENT GOALS: get movement and mobility back   NEXT MD VISIT: Referring in 4 weeks   OBJECTIVE:   DIAGNOSTIC  FINDINGS:   PATIENT SURVEYS:  FOTO 78  COGNITION: Overall cognitive status: Within functional limits for tasks assessed     SENSATION: Not tested  EDEMA:   Appropriate for post-op state   MUSCLE LENGTH:    POSTURE:   PALPATION:   LOWER EXTREMITY ROM:  Active ROM = A Passive ROM = P Right eval Left eval Left 01/30/23 Left 02/03/23 Left 02/06/23 Left 02/14/23 Left 02/17/23 Left 02/20/23  Hip flexion          Hip extension          Hip abduction          Hip adduction          Hip internal rotation          Hip external rotation          Knee flexion  73*  A: 80 P: 85 Active 92 A: 90 P: 95 AAROM: 90*, A: 80* AAROM 90*, AROM 80* AROM 94 PROM 99  Knee extension  15* A: 12 P: 10 -12 A: 12 P: 10 A 5* A supine 12*   Ankle dorsiflexion          Ankle plantarflexion          Ankle inversion          Ankle eversion           (Blank rows = not tested)  LOWER EXTREMITY MMT:  MMT Right eval Left eval R  02/17/23 L 02/17/23  Hip flexion 4+ 3+ pain limited  4+ 4+  Hip extension      Hip abduction      Hip adduction      Hip internal rotation      Hip external rotation      Knee flexion 4- 3+ 4+ 4-  Knee extension 5 4- 5 4  Ankle dorsiflexion 5 5    Ankle plantarflexion      Ankle inversion      Ankle eversion       (Blank rows = not tested)  LOWER EXTREMITY SPECIAL TESTS:    FUNCTIONAL TESTS:  Eval:  Timed up and go (TUG): 23 seconds RW  3 minute walk test: 312ft with RW    02/17/23- TUG 16.5 seconds, hurry-cane; 427ft hurry-cane  GAIT: Distance walked: 34ft  Assistive device utilized: Environmental consultant - 2 wheeled Level of assistance: Modified independence Comments: flexed at hips, antalgic with less wt shift to left, limited ROM in L knee in stance and swing phases    TODAY'S TREATMENT:                                                                                                                              DATE:  02/20/23  TherEx  Scifit bike seat 7 progressing to seat 6 x 10 minutes total Step ups on 4 inch step leading with left and down in front leading with Rt X 10 reps using  UE support Standing knee flexion lunge stretch with left foot on treadmill 5 sec X 10 Shuttle LE press L LE 50# x10 with 5  second flexion stretch, DL press 40#81# X 10 5 sec flexion stretch Sit to stand with knee flexion stretch using wall to block feet X 10 seconds using UE support Seated knee flexion AAROM 5 sec X 10  Manual therapy: Lt knee PROM with overpressure to tolerance and knee flexion mobs    02/17/23   FOTO 54  Objective  measures/goal review for progress note + appropriate education  TherEx  Scifit bike seat 7 progressing to seat 6 full rotations x6 minutes LAQs 7# x12 with 3 second holds  SLRs + quad set 2# x15       Manual  Tib on femur grade III mobs for flexion 5 rounds Knee flexion AAROM x10 edge of mat table    02/14/23  TherEx  SciFit bike seat 7 progressing to seat 6,  full rotations x6 minutes Self knee flexion stretch 15 x5 second holds  Discussed floor bikes on [employer]/use at home to keep working on ROM, also discussed use of tennis ball massage to help with quad spasms  Knee flexion self stretch 10x10 second holds 8 inch box  Quad stretch supine at edge of table 4x30 seconds Single leg LE press 50# + 5 second flexion stretch for ROM LLE 5x10       PATIENT EDUCATION:  Education details: POC, HEP, exam findings, edema management, general progression through PT after TKR, elevation strategies at home Person educated: Patient and significant other  Education method: Explanation, Demonstration, and Handouts Education comprehension: verbalized understanding, returned demonstration, and needs further education  HOME EXERCISE PROGRAM: Access Code: [email or web address] URL: [email or web address] Date: 02/03/2023 Prepared by: Pauletta Brownsobert Lovell  Exercises - Seated Long Arc Quad  - 4-5 x daily - 7 x weekly - 2 sets - 10 reps - 3-5 sec hold - Seated Small Alternating Straight Leg Lifts with Heel Touch  - 4-5 x daily - 7 x weekly - 2 sets - 10 reps - Supine Heel Slide with Strap  - 4-5 x daily - 7 x weekly - 10 reps - 5 seconds hold - Sit to Stand with Counter Support  - 4-5 x daily - 7 x weekly - 2 sets - 10 reps - Heel Raises with Counter Support  - 4-5 x daily - 7 x weekly - 2 sets - 10 reps - Supine Quadricep Sets  - 5 x daily - 7 x weekly - 2 sets - 10 reps - 5 second hold - Seated Passive Knee Extension  - 3 x daily - 7 x weekly - 1 sets - 1 reps - 3-5 minutes  hold  ASSESSMENT:  CLINICAL IMPRESSION:  He feels like he has been stuck with knee flexion ROM, I showed him some additional stretches for this and after manual therapy for aggressive ROM I did measure and he showed some progress. Hopefully he will continue to gain more ROM with more PT and work at home as well.    OBJECTIVE IMPAIRMENTS: Abnormal gait, decreased activity tolerance, decreased balance, decreased knowledge of use of DME, decreased mobility, difficulty walking, decreased ROM, decreased strength, hypomobility, increased edema, increased fascial restrictions, impaired flexibility, and pain.   ACTIVITY LIMITATIONS: standing, squatting, stairs, transfers, and locomotion level  PARTICIPATION LIMITATIONS: driving, shopping, community activity, occupation, and yard work  PERSONAL FACTORS: Age, Behavior pattern, Education, Fitness, Past/current experiences, and Time since onset of injury/illness/exacerbation  are also affecting patient's functional outcome.   REHAB POTENTIAL: Good  CLINICAL DECISION MAKING: Stable/uncomplicated  EVALUATION COMPLEXITY: Low   GOALS: Goals reviewed with patient? Yes  SHORT TERM GOALS: Target date: 02/21/2023   Will be compliant with progressive appropriate HEP  Baseline: Goal status: MET 02/06/23  2.  L knee extension AROM to be no more than 5* and flexion AROM to be at least 90* Baseline:  Goal status: Partially Met 02/17/23  3.  Will be able to ambulate in home distances with LRAD without significant gait deviation Baseline:  Goal status: PARTIALLY MET 02/17/23- mild gait impairment   4.  Will demonstrate equal weight bearing BLEs with functional transfers and no offshift from L LE  Baseline:  Goal status: MET 02/17/23    LONG TERM GOALS: Target date: 03/21/2023    MMT to have improved by at least 1 grade in all weak groups  Baseline:  Goal status: IN PROGRESS 02/17/23  2.  L knee flexion AROM to be at least 105*  Baseline:  Goal status:  IN PROGRESS 02/17/23  3.  Will be able to ambulate at least 459ft with LRAD in to show improved functional mobility  Baseline:  Goal status: MET 02/17/23  4.  Will be able to complete TUG in 13 seconds or less with LRAD in order to show improved functional mobility and balance  Baseline:  Goal status: IN PROGRESS 02/17/23  5.  Will be able to reciprocal ascend/descend 7 steps with no increase in pain and U rail to improve home and community access  Baseline:  Goal status: IN PROGRESS4/5/24    PLAN:  PT FREQUENCY:  3x/week for first 4 weeks, then 2x/week for next 4 weeks   PT DURATION: 8 weeks  PLANNED INTERVENTIONS: Therapeutic exercises, Therapeutic activity, Neuromuscular re-education, Balance training, Gait training, Patient/Family education, Self Care, Joint mobilization, Stair training, DME instructions, Aquatic Therapy, Dry Needling, Electrical stimulation, Cryotherapy, Moist heat, Taping, Vasopneumatic device, Ultrasound, Ionotophoresis 4mg /ml Dexamethasone, Manual therapy, and Re-evaluation  PLAN FOR NEXT SESSION: Knee flexion ROM focus and overall knee strength and gait as tolerated. What did MD say?  Ivery Quale, PT, DPT 02/20/23 4:10 PM

## 2023-02-22 ENCOUNTER — Ambulatory Visit: Payer: Managed Care, Other (non HMO) | Admitting: Physician Assistant

## 2023-02-22 ENCOUNTER — Encounter: Payer: Self-pay | Admitting: Physical Therapy

## 2023-02-22 ENCOUNTER — Ambulatory Visit: Payer: Managed Care, Other (non HMO) | Admitting: Physical Therapy

## 2023-02-22 ENCOUNTER — Ambulatory Visit (INDEPENDENT_AMBULATORY_CARE_PROVIDER_SITE_OTHER): Payer: Managed Care, Other (non HMO)

## 2023-02-22 ENCOUNTER — Encounter: Payer: Self-pay | Admitting: Orthopaedic Surgery

## 2023-02-22 DIAGNOSIS — M25562 Pain in left knee: Secondary | ICD-10-CM | POA: Diagnosis not present

## 2023-02-22 DIAGNOSIS — Z96652 Presence of left artificial knee joint: Secondary | ICD-10-CM

## 2023-02-22 DIAGNOSIS — M25662 Stiffness of left knee, not elsewhere classified: Secondary | ICD-10-CM | POA: Diagnosis not present

## 2023-02-22 DIAGNOSIS — R600 Localized edema: Secondary | ICD-10-CM | POA: Diagnosis not present

## 2023-02-22 DIAGNOSIS — R262 Difficulty in walking, not elsewhere classified: Secondary | ICD-10-CM

## 2023-02-22 MED ORDER — TRAMADOL HCL 50 MG PO TABS: 50.0000 mg | ORAL_TABLET | Freq: Three times a day (TID) | ORAL | 2 refills | Status: DC | PRN

## 2023-02-22 NOTE — Progress Notes (Signed)
Post-Op Visit Note   Patient: Craig House           Date of Birth: 08-Dec-1960           MRN: [id number] Visit Date: 02/22/2023 PCP: Esperanza Richters, PA-C   Assessment & Plan:  Chief Complaint:  Chief Complaint  Patient presents with   Left Knee - Follow-up    Left total knee arthroplasty 01/09/2023   Visit Diagnoses:  1. Status post total left knee replacement     Plan: Patient is a pleasant 63 year old gentleman who comes in today 6 weeks status post left total knee replacement 01/09/2023.  He has been doing relatively well.  He is taking Tylenol for pain which is not helping facilitate range of motion and physical therapy.  Oxycodone is not helping with pain.  He has been compliant taking a baby aspirin twice daily for DVT prophylaxis.  Examination of the left knee reveals a fully healed surgical scar without complication.  Range of motion 0 to 95 degrees.  He is stable to valgus and varus stress.  Calf is soft nontender.  He is neurovascular intact distally.  At this point, we discussed needing to continue to progress with physical therapy specifically range of motion.  Talked about starting tramadol to help with this for which she is agreeable to.  He may discontinue her baby aspirin twice daily for which he was taking for DVT prophylaxis.  Dental prophylaxis reinforced.  Follow-up in 6 weeks for recheck.  Call with concerns or questions.  Follow-Up Instructions: Return in about 6 weeks (around 04/05/2023).   Orders:  Orders Placed This Encounter  Procedures   XR Knee 1-2 Views Left   Meds ordered this encounter  Medications   traMADol (ULTRAM) 50 MG tablet    Sig: Take 1 tablet (50 mg total) by mouth 3 (three) times daily as needed.    Dispense:  60 tablet    Refill:  2    Imaging: XR Knee 1-2 Views Left  Result Date: 02/22/2023 Well-seated prosthesis without complication   PMFS History: Patient Active Problem List   Diagnosis Date Noted   Primary  osteoarthritis of left knee 01/09/2023   Status post total left knee replacement 01/09/2023   Bilateral primary osteoarthritis of knee 06/19/2018   Mild intermittent asthma, uncomplicated 01/18/2016   HTN (hypertension) 12/31/2011   Past Medical History:  Diagnosis Date   Allergy    spring   Anemia    Asthma    Diabetes mellitus without complication    type II   Hyperplastic colon polyp    Hypertension    Obesity     Family History  Problem Relation Age of Onset   Heart attack Mother    Cancer Father    Heart disease Father    Colon cancer Neg Hx     Past Surgical History:  Procedure Laterality Date   KNEE ARTHROSCOPY Bilateral    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   Procedure: LEFT TOTAL KNEE ARTHROPLASTY;  Surgeon: Tarry Kos, MD;  Location: [facility] OR;  Service: Orthopedics;  Laterality: Left;   Social History   Occupational History   Not on file  Tobacco Use   Smoking status: Former   Smokeless tobacco: Never  Vaping Use   Vaping Use: Never used  Substance and Sexual Activity   Alcohol use: Yes    Alcohol/week: 1.0 standard drink of alcohol    Types: 1 Standard drinks or equivalent per week  Comment: 1 glass of red wine at night   Drug use: No   Sexual activity: Yes

## 2023-02-22 NOTE — Therapy (Signed)
OUTPATIENT PHYSICAL THERAPY LOWER EXTREMITY  TREATMENT NOTE   Patient Name: Craig House Craig Lusby MRN: [id number] DOB:[birth year], [age] y.o., male Today's Date: 02/22/2023  END OF SESSION:  PT End of Session - 02/22/23 1352     Visit Number 12    Number of Visits 21    Date for PT Re-Evaluation 03/21/23    Authorization Type [employer]    Authorization - Number of Visits 90    PT Start Time 1345    PT Stop Time 1426    PT Time Calculation (min) 41 min    Activity Tolerance Patient tolerated treatment well    Behavior During [facility] for tasks assessed/performed                      Past Medical History:  Diagnosis Date   Allergy    spring   Anemia    Asthma    Diabetes mellitus without complication    type II   Hyperplastic colon polyp    Hypertension    Obesity    Past Surgical History:  Procedure Laterality Date   KNEE ARTHROSCOPY Bilateral    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   Procedure: LEFT TOTAL KNEE ARTHROPLASTY;  Surgeon: Tarry KosXu, Naiping M, MD;  Location: [facility] OR;  Service: Orthopedics;  Laterality: Left;   Patient Active Problem List   Diagnosis Date Noted   Primary osteoarthritis of left knee 01/09/2023   Status post total left knee replacement 01/09/2023   Bilateral primary osteoarthritis of knee 06/19/2018   Mild intermittent asthma, uncomplicated 01/18/2016   HTN (hypertension) 12/31/2011    PCP: Craig House, Edward PA-C   REFERRING PROVIDER: Tarry KosXu, Naiping M, MD  REFERRING DIAG: [phone] (ICD-10-CM) - Primary osteoarthritis of left knee  THERAPY DIAG:  Acute pain of left knee  Stiffness of left knee, not elsewhere classified  Difficulty in walking, not elsewhere classified  Localized edema  Rationale for Evaluation and Treatment: Rehabilitation  ONSET DATE: 01/05/2023  SUBJECTIVE:   SUBJECTIVE STATEMENT:  MD said knee looks good and told me to continue therapy, told me a bit about manipulation if its not a certain number by a  certain date. Cleared to drive now.   PERTINENT HISTORY: Hospital Course: Craig House Craig House is an 62 y.o. male who was admitted 01/09/2023 for operative treatment ofPrimary osteoarthritis of left knee. Patient has severe unremitting pain that affects sleep, daily activities, and work/hobbies. After pre-op clearance the patient was taken to the operating room on 01/09/2023 and underwent  Procedure(s): LEFT TOTAL KNEE ARTHROPLASTY.   PAIN:  Are you having pain? Yes: NPRS scale: 2/10 Pain location: L knee  Pain description: aching throb, stiffness  Aggravating factors: stretching and bending  Relieving factors: ice   PRECAUTIONS: Fall  WEIGHT BEARING RESTRICTIONS: No  FALLS:  Has patient fallen in last 6 months? 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/moving around   PLOF: Independent, Independent with basic ADLs, Independent with gait, and Independent with transfers  PATIENT GOALS: get movement and mobility back   NEXT MD VISIT: Referring in 4 weeks   OBJECTIVE:   DIAGNOSTIC FINDINGS:   PATIENT SURVEYS:  FOTO 1443  COGNITION: Overall cognitive  status: Within functional limits for tasks assessed     SENSATION: Not tested  EDEMA:   Appropriate for post-op state   MUSCLE LENGTH:    POSTURE:   PALPATION:   LOWER EXTREMITY ROM:  Active ROM = A Passive ROM = P Right eval Left eval Left 01/30/23 Left 02/03/23 Left 02/06/23 Left 02/14/23 Left 02/17/23 Left 02/20/23  Hip flexion          Hip extension          Hip abduction          Hip adduction          Hip internal rotation          Hip external rotation          Knee flexion  73* A: 80 P: 85 Active 92 A: 90 P: 95 AAROM:  90*, A: 80* AAROM 90*, AROM 80* AROM 94 PROM 99  Knee extension  15* A: 12 P: 10 -12 A: 12 P: 10 A 5* A supine 12*   Ankle dorsiflexion          Ankle plantarflexion          Ankle inversion          Ankle eversion           (Blank rows = not tested)  LOWER EXTREMITY MMT:  MMT Right eval Left eval R  02/17/23 L 02/17/23  Hip flexion 4+ 3+ pain limited  4+ 4+  Hip extension      Hip abduction      Hip adduction      Hip internal rotation      Hip external rotation      Knee flexion 4- 3+ 4+ 4-  Knee extension 5 4- 5 4  Ankle dorsiflexion 5 5    Ankle plantarflexion      Ankle inversion      Ankle eversion       (Blank rows = not tested)  LOWER EXTREMITY SPECIAL TESTS:    FUNCTIONAL TESTS:  Eval:  Timed up and go (TUG): 23 seconds RW  3 minute walk test: 367ft with RW    02/17/23- TUG 16.5 seconds, hurry-cane; 454ft hurry-cane  GAIT: Distance walked: 337ft  Assistive device utilized: Environmental consultant - 2 wheeled Level of assistance: Modified independence Comments: flexed at hips, antalgic with less wt shift to left, limited ROM in L knee in stance and swing phases    TODAY'S TREATMENT:                                                                                                                              DATE:   02/22/23  TherEx  Scifit bike seat 6 --> seat 5 x8 minutes full rotations for ROM Seated on high mat table- knee self flexion stretch x15 five second holds Shuttle LE press 50# x12 with 5 second flexion stretch at bottom  Shuttle DL press 78#  up, L LE lower down with 5 second x10 flexion stretch at bottom  Forward step downs 6 inch step BUE support on // bars L LE only x10  Standing knee flexion lunge stretch 6 inch box x10 5 second holds    Manual  Knee AAROM with OP + some joint distraction to tolerance        02/20/23  TherEx  Scifit bike seat 7 progressing to seat 6 x 10 minutes total Step ups on 4 inch step leading with left and down in  front leading with Rt X 10 reps using UE support Standing knee flexion lunge stretch with left foot on treadmill 5 sec X 10 Shuttle LE press L LE 50# x10 with 5  second flexion stretch, DL press 62# X 10 5 sec flexion stretch Sit to stand with knee flexion stretch using wall to block feet X 10 seconds using UE support Seated knee flexion AAROM 5 sec X 10  Manual therapy: Lt knee PROM with overpressure to tolerance and knee flexion mobs    02/17/23   FOTO 54  Objective measures/goal review for progress note + appropriate education  TherEx  Scifit bike seat 7 progressing to seat 6 full rotations x6 minutes LAQs 7# x12 with 3 second holds  SLRs + quad set 2# x15       Manual  Tib on femur grade III mobs for flexion 5 rounds Knee flexion AAROM x10 edge of mat table    02/14/23  TherEx  SciFit bike seat 7 progressing to seat 6,  full rotations x6 minutes Self knee flexion stretch 15 x5 second holds  Discussed floor bikes on [employer]/use at home to keep working on ROM, also discussed use of tennis ball massage to help with quad spasms  Knee flexion self stretch 10x10 second holds 8 inch box  Quad stretch supine at edge of table 4x30 seconds Single leg LE press 50# + 5 second flexion stretch for ROM LLE 5x10       PATIENT EDUCATION:  Education details: POC, HEP, exam findings, edema management, general progression through PT after TKR, elevation strategies at home Person educated: Patient and significant other  Education method: Explanation, Demonstration, and Handouts Education comprehension: verbalized understanding, returned demonstration, and needs further education  HOME EXERCISE PROGRAM: Access Code: [email or web address] URL: [email or web address] Date: 02/03/2023 Prepared by: Pauletta Browns  Exercises - Seated Long Arc Quad  - 4-5 x daily - 7 x weekly - 2 sets - 10 reps - 3-5 sec hold - Seated Small Alternating Straight Leg Lifts with Heel Touch  - 4-5 x  daily - 7 x weekly - 2 sets - 10 reps - Supine Heel Slide with Strap  - 4-5 x daily - 7 x weekly - 10 reps - 5 seconds hold - Sit to Stand with Counter Support  - 4-5 x daily - 7 x weekly - 2 sets - 10 reps - Heel Raises with Counter Support  - 4-5 x daily - 7 x weekly - 2 sets - 10 reps - Supine Quadricep Sets  - 5 x daily - 7 x weekly - 2 sets - 10 reps - 5 second hold - Seated Passive Knee Extension  - 3 x daily - 7 x weekly - 1 sets - 1 reps - 3-5 minutes hold  ASSESSMENT:  CLINICAL IMPRESSION:   [name] arrives today doing OK, knee is still stiff but slowly coming along. Continued with focus on knee flexion ROM and quad strength. Will  continue with aggressive efforts for ROM.     OBJECTIVE IMPAIRMENTS: Abnormal gait, decreased activity tolerance, decreased balance, decreased knowledge of use of DME, decreased mobility, difficulty walking, decreased ROM, decreased strength, hypomobility, increased edema, increased fascial restrictions, impaired flexibility, and pain.   ACTIVITY LIMITATIONS: standing, squatting, stairs, transfers, and locomotion level  PARTICIPATION LIMITATIONS: driving, shopping, community activity, occupation, and yard work  PERSONAL FACTORS: Age, Behavior pattern, Education, Fitness, Past/current experiences, and Time since onset of injury/illness/exacerbation are also affecting patient's functional outcome.   REHAB POTENTIAL: Good  CLINICAL DECISION MAKING: Stable/uncomplicated  EVALUATION COMPLEXITY: Low   GOALS: Goals reviewed with patient? Yes  SHORT TERM GOALS: Target date: 02/21/2023   Will be compliant with progressive appropriate HEP  Baseline: Goal status: MET 02/06/23  2.  L knee extension AROM to be no more than 5* and flexion AROM to be at least 90* Baseline:  Goal status: Partially Met 02/17/23  3.  Will be able to ambulate in home distances with LRAD without significant gait deviation Baseline:  Goal status: PARTIALLY MET 02/17/23- mild gait  impairment   4.  Will demonstrate equal weight bearing BLEs with functional transfers and no offshift from L LE  Baseline:  Goal status: MET 02/17/23    LONG TERM GOALS: Target date: 03/21/2023    MMT to have improved by at least 1 grade in all weak groups  Baseline:  Goal status: IN PROGRESS 02/17/23  2.  L knee flexion AROM to be at least 105*  Baseline:  Goal status: IN PROGRESS 02/17/23  3.  Will be able to ambulate at least 439ft with LRAD in to show improved functional mobility  Baseline:  Goal status: MET 02/17/23  4.  Will be able to complete TUG in 13 seconds or less with LRAD in order to show improved functional mobility and balance  Baseline:  Goal status: IN PROGRESS 02/17/23  5.  Will be able to reciprocal ascend/descend 7 steps with no increase in pain and U rail to improve home and community access  Baseline:  Goal status: IN PROGRESS4/5/24    PLAN:  PT FREQUENCY:  3x/week for first 4 weeks, then 2x/week for next 4 weeks   PT DURATION: 8 weeks  PLANNED INTERVENTIONS: Therapeutic exercises, Therapeutic activity, Neuromuscular re-education, Balance training, Gait training, Patient/Family education, Self Care, Joint mobilization, Stair training, DME instructions, Aquatic Therapy, Dry Needling, Electrical stimulation, Cryotherapy, Moist heat, Taping, Vasopneumatic device, Ultrasound, Ionotophoresis 4mg /ml Dexamethasone, Manual therapy, and Re-evaluation  PLAN FOR NEXT SESSION: Knee flexion ROM focus and overall knee strength and gait as tolerated.   Nedra Hai PT DPT PN2

## 2023-02-24 ENCOUNTER — Ambulatory Visit (INDEPENDENT_AMBULATORY_CARE_PROVIDER_SITE_OTHER): Payer: Managed Care, Other (non HMO) | Admitting: Physical Therapy

## 2023-02-24 ENCOUNTER — Encounter: Payer: Self-pay | Admitting: Physical Therapy

## 2023-02-24 DIAGNOSIS — R262 Difficulty in walking, not elsewhere classified: Secondary | ICD-10-CM | POA: Diagnosis not present

## 2023-02-24 DIAGNOSIS — R600 Localized edema: Secondary | ICD-10-CM | POA: Diagnosis not present

## 2023-02-24 DIAGNOSIS — M25562 Pain in left knee: Secondary | ICD-10-CM

## 2023-02-24 DIAGNOSIS — M25662 Stiffness of left knee, not elsewhere classified: Secondary | ICD-10-CM

## 2023-02-24 NOTE — Therapy (Signed)
OUTPATIENT PHYSICAL THERAPY LOWER EXTREMITY  TREATMENT NOTE   Patient Name: Elman Dettman MRN: [id number] DOB:[birth year], 62 y.o., male Today's Date: 02/24/2023  END OF SESSION:  PT End of Session - 02/24/23 1351     Visit Number 13    Number of Visits 21    Date for PT Re-Evaluation 03/21/23    Authorization Type [employer]    Authorization - Number of Visits 90    PT Start Time 1345    PT Stop Time 1426    PT Time Calculation (min) 41 min    Activity Tolerance Patient tolerated treatment well    Behavior During Therapy [facility] for tasks assessed/performed                       Past Medical History:  Diagnosis Date   Allergy    spring   Anemia    Asthma    Diabetes mellitus without complication    type II   Hyperplastic colon polyp    Hypertension    Obesity    Past Surgical History:  Procedure Laterality Date   KNEE ARTHROSCOPY Bilateral    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   Procedure: LEFT TOTAL KNEE ARTHROPLASTY;  Surgeon: Tarry Kos, MD;  Location: [facility] OR;  Service: Orthopedics;  Laterality: Left;   Patient Active Problem List   Diagnosis Date Noted   Primary osteoarthritis of left knee 01/09/2023   Status post total left knee replacement 01/09/2023   Bilateral primary osteoarthritis of knee 06/19/2018   Mild intermittent asthma, uncomplicated 01/18/2016   HTN (hypertension) 12/31/2011    PCP: Marisue Brooklyn   REFERRING PROVIDER: Tarry Kos, MD  REFERRING DIAG: [phone] (ICD-10-CM) - Primary osteoarthritis of left knee  THERAPY DIAG:  Stiffness of left knee, not elsewhere classified  Acute pain of left knee  Difficulty in walking, not elsewhere classified  Localized edema  Rationale for Evaluation and Treatment: Rehabilitation  ONSET DATE: 01/05/2023  SUBJECTIVE:   SUBJECTIVE STATEMENT:  Having a hard time due to allergies/pollen, was a bit stiff this morning but I got on the bike and worked it out a bit.  Everything else is feeling pretty good.   PERTINENT HISTORY: Hospital Course: Eva Vallee is an 63 y.o. male who was admitted 01/09/2023 for operative treatment ofPrimary osteoarthritis of left knee. Patient has severe unremitting pain that affects sleep, daily activities, and work/hobbies. After pre-op clearance the patient was taken to the operating room on 01/09/2023 and underwent  Procedure(s): LEFT TOTAL KNEE ARTHROPLASTY.   PAIN:  Are you having pain? Yes: NPRS scale: 2/10 Pain location: L knee  Pain description: stiffness/tightness especially medical knee Aggravating factors: stretching and bending  Relieving factors: ice   PRECAUTIONS: Fall  WEIGHT BEARING RESTRICTIONS: No  FALLS:  Has patient fallen in last 6 months? 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   PLOF: Independent, Independent with basic ADLs, Independent with gait, and Independent with transfers  PATIENT GOALS: get movement and mobility back   NEXT MD VISIT: Referring in 4 weeks   OBJECTIVE:   DIAGNOSTIC FINDINGS:   PATIENT SURVEYS:  FOTO 24  COGNITION: Overall  cognitive status: Within functional limits for tasks assessed     SENSATION: Not tested  EDEMA:   Appropriate for post-op state   MUSCLE LENGTH:    POSTURE:   PALPATION:   LOWER EXTREMITY ROM:  Active ROM = A Passive ROM = P Right eval Left eval Left 01/30/23 Left 02/03/23 Left 02/06/23 Left 02/14/23 Left 02/17/23 Left 02/20/23 Left 02/24/23  Hip flexion           Hip extension           Hip abduction           Hip adduction           Hip internal rotation           Hip external rotation           Knee flexion  73*  A: 80 P: 85 Active 92 A: 90 P: 95 AAROM: 90*, A: 80* AAROM 90*, AROM 80* AROM 94 PROM 99 88* AROM, AAROM 99*  Knee extension  15* A: 12 P: 10 -12 A: 12 P: 10 A 5* A supine 12*    Ankle dorsiflexion           Ankle plantarflexion           Ankle inversion           Ankle eversion            (Blank rows = not tested)  LOWER EXTREMITY MMT:  MMT Right eval Left eval R  02/17/23 L 02/17/23  Hip flexion 4+ 3+ pain limited  4+ 4+  Hip extension      Hip abduction      Hip adduction      Hip internal rotation      Hip external rotation      Knee flexion 4- 3+ 4+ 4-  Knee extension 5 4- 5 4  Ankle dorsiflexion 5 5    Ankle plantarflexion      Ankle inversion      Ankle eversion       (Blank rows = not tested)  LOWER EXTREMITY SPECIAL TESTS:    FUNCTIONAL TESTS:  Eval:  Timed up and go (TUG): 23 seconds RW  3 minute walk test: 323ft with RW    02/17/23- TUG 16.5 seconds, hurry-cane; 462ft hurry-cane  GAIT: Distance walked: 317ft  Assistive device utilized: Environmental consultant - 2 wheeled Level of assistance: Modified independence Comments: flexed at hips, antalgic with less wt shift to left, limited ROM in L knee in stance and swing phases    TODAY'S TREATMENT:                                                                                                                              DATE:    02/24/23  TherEx  Scifit bike seat 6 progressing to seat 5 full rotations Knee flexion stretch 10x10 second holds 8 inch box Forward step downs 6 inch  box BUE support on // bars L LE only x15  Squats with sink into knee flexion stretch x10 with 5 second holds Quad stretch with bent knee on table 3x30 seconds  Shuttle DL press 42# up, L LE lower down with 5 second holds into flexion stretch x12- to 98* flexion   Manual   Knee AAROM for flexion on high mat table with overpressure from PT    02/22/23  TherEx  Scifit bike seat 6 --> seat 5 x8 minutes full rotations for ROM Seated on  high mat table- knee self flexion stretch x15 five second holds Shuttle LE press 50# x12 with 5 second flexion stretch at bottom  Shuttle DL press 68# up, L LE lower down with 5 second x10 flexion stretch at bottom  Forward step downs 6 inch step BUE support on // bars L LE only x10  Standing knee flexion lunge stretch 6 inch box x10 5 second holds    Manual  Knee AAROM with OP + some joint distraction to tolerance        02/20/23  TherEx  Scifit bike seat 7 progressing to seat 6 x 10 minutes total Step ups on 4 inch step leading with left and down in front leading with Rt X 10 reps using UE support Standing knee flexion lunge stretch with left foot on treadmill 5 sec X 10 Shuttle LE press L LE 50# x10 with 5  second flexion stretch, DL press 34# X 10 5 sec flexion stretch Sit to stand with knee flexion stretch using wall to block feet X 10 seconds using UE support Seated knee flexion AAROM 5 sec X 10  Manual therapy: Lt knee PROM with overpressure to tolerance and knee flexion mobs    02/17/23   FOTO 54  Objective measures/goal review for progress note + appropriate education  TherEx  Scifit bike seat 7 progressing to seat 6 full rotations x6 minutes LAQs 7# x12 with 3 second holds  SLRs + quad set 2# x15       Manual  Tib on femur grade III mobs for flexion 5 rounds Knee flexion AAROM x10 edge of mat table    02/14/23  TherEx  SciFit bike seat 7 progressing to seat 6,  full rotations x6 minutes Self knee flexion stretch 15 x5 second holds  Discussed floor bikes on [employer]/use at home to keep working on ROM, also discussed use of tennis ball massage to help with quad spasms  Knee flexion self stretch 10x10 second holds 8 inch box  Quad stretch supine at edge of table 4x30 seconds Single leg LE press 50# + 5 second flexion stretch for ROM LLE 5x10       PATIENT EDUCATION:  Education details: POC, HEP, exam findings, edema management, general  progression through PT after TKR, elevation strategies at home Person educated: Patient and significant other  Education method: Explanation, Demonstration, and Handouts Education comprehension: verbalized understanding, returned demonstration, and needs further education  HOME EXERCISE PROGRAM: Access Code: [email or web address] URL: [email or web address] Date: 02/03/2023 Prepared by: Pauletta Browns  Exercises - Seated Long Arc Quad  - 4-5 x daily - 7 x weekly - 2 sets - 10 reps - 3-5 sec hold - Seated Small Alternating Straight Leg Lifts with Heel Touch  - 4-5 x daily - 7 x weekly - 2 sets - 10 reps - Supine Heel Slide with Strap  - 4-5 x daily - 7 x weekly - 10 reps - 5 seconds hold -  Sit to Stand with Counter Support  - 4-5 x daily - 7 x weekly - 2 sets - 10 reps - Heel Raises with Counter Support  - 4-5 x daily - 7 x weekly - 2 sets - 10 reps - Supine Quadricep Sets  - 5 x daily - 7 x weekly - 2 sets - 10 reps - 5 second hold - Seated Passive Knee Extension  - 3 x daily - 7 x weekly - 1 sets - 1 reps - 3-5 minutes hold  ASSESSMENT:  CLINICAL IMPRESSION:   [name] arrives today doing OK, not feeling so great from allergies today. Continued with POC as tolerated, knee ROM still limited but slowly coming along. Will continue as tolerated.     OBJECTIVE IMPAIRMENTS: Abnormal gait, decreased activity tolerance, decreased balance, decreased knowledge of use of DME, decreased mobility, difficulty walking, decreased ROM, decreased strength, hypomobility, increased edema, increased fascial restrictions, impaired flexibility, and pain.   ACTIVITY LIMITATIONS: standing, squatting, stairs, transfers, and locomotion level  PARTICIPATION LIMITATIONS: driving, shopping, community activity, occupation, and yard work  PERSONAL FACTORS: Age, Behavior pattern, Education, Fitness, Past/current experiences, and Time since onset of injury/illness/exacerbation are also affecting patient's functional  outcome.   REHAB POTENTIAL: Good  CLINICAL DECISION MAKING: Stable/uncomplicated  EVALUATION COMPLEXITY: Low   GOALS: Goals reviewed with patient? Yes  SHORT TERM GOALS: Target date: 02/21/2023   Will be compliant with progressive appropriate HEP  Baseline: Goal status: MET 02/06/23  2.  L knee extension AROM to be no more than 5* and flexion AROM to be at least 90* Baseline:  Goal status: Partially Met 02/17/23  3.  Will be able to ambulate in home distances with LRAD without significant gait deviation Baseline:  Goal status: PARTIALLY MET 02/17/23- mild gait impairment   4.  Will demonstrate equal weight bearing BLEs with functional transfers and no offshift from L LE  Baseline:  Goal status: MET 02/17/23    LONG TERM GOALS: Target date: 03/21/2023    MMT to have improved by at least 1 grade in all weak groups  Baseline:  Goal status: IN PROGRESS 02/17/23  2.  L knee flexion AROM to be at least 105*  Baseline:  Goal status: IN PROGRESS 02/17/23  3.  Will be able to ambulate at least 478ft with LRAD in to show improved functional mobility  Baseline:  Goal status: MET 02/17/23  4.  Will be able to complete TUG in 13 seconds or less with LRAD in order to show improved functional mobility and balance  Baseline:  Goal status: IN PROGRESS 02/17/23  5.  Will be able to reciprocal ascend/descend 7 steps with no increase in pain and U rail to improve home and community access  Baseline:  Goal status: IN PROGRESS4/5/24    PLAN:  PT FREQUENCY:  3x/week for first 4 weeks, then 2x/week for next 4 weeks   PT DURATION: 8 weeks  PLANNED INTERVENTIONS: Therapeutic exercises, Therapeutic activity, Neuromuscular re-education, Balance training, Gait training, Patient/Family education, Self Care, Joint mobilization, Stair training, DME instructions, Aquatic Therapy, Dry Needling, Electrical stimulation, Cryotherapy, Moist heat, Taping, Vasopneumatic device, Ultrasound, Ionotophoresis  /ml Dexamethasone, Manual therapy, and Re-evaluation  PLAN FOR NEXT SESSION: Knee flexion ROM focus and overall knee strength and gait as tolerated. Try precor bike??   Nedra Hai PT DPT PN2

## 2023-03-01 ENCOUNTER — Encounter: Payer: Self-pay | Admitting: Physical Therapy

## 2023-03-01 ENCOUNTER — Ambulatory Visit: Payer: Managed Care, Other (non HMO) | Admitting: Physical Therapy

## 2023-03-01 DIAGNOSIS — M25662 Stiffness of left knee, not elsewhere classified: Secondary | ICD-10-CM

## 2023-03-01 DIAGNOSIS — M25562 Pain in left knee: Secondary | ICD-10-CM | POA: Diagnosis not present

## 2023-03-01 DIAGNOSIS — R262 Difficulty in walking, not elsewhere classified: Secondary | ICD-10-CM

## 2023-03-01 DIAGNOSIS — R600 Localized edema: Secondary | ICD-10-CM | POA: Diagnosis not present

## 2023-03-01 NOTE — Therapy (Signed)
OUTPATIENT PHYSICAL THERAPY LOWER EXTREMITY  TREATMENT NOTE   Patient Name: Craig House MRN: [id number] DOB:1960/12/15, 62 y.o., male Today's Date: 03/01/2023  END OF SESSION:  PT End of Session - 03/01/23 1438     Visit Number 14    Number of Visits 21    Date for PT Re-Evaluation 03/21/23    Authorization Type [employer]    Authorization - Number of Visits 90    PT Start Time 1431    PT Stop Time 1511    PT Time Calculation (min) 40 min    Activity Tolerance Patient tolerated treatment well    Behavior During Therapy [facility] for tasks assessed/performed                        Past Medical History:  Diagnosis Date   Allergy    spring   Anemia    Asthma    Diabetes mellitus without complication    type II   Hyperplastic colon polyp    Hypertension    Obesity    Past Surgical History:  Procedure Laterality Date   KNEE ARTHROSCOPY Bilateral    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   Procedure: LEFT TOTAL KNEE ARTHROPLASTY;  Surgeon: Tarry Kos, MD;  Location: [facility] OR;  Service: Orthopedics;  Laterality: Left;   Patient Active Problem List   Diagnosis Date Noted   Primary osteoarthritis of left knee 01/09/2023   Status post total left knee replacement 01/09/2023   Bilateral primary osteoarthritis of knee 06/19/2018   Mild intermittent asthma, uncomplicated 01/18/2016   HTN (hypertension) 12/31/2011    PCP: Craig House   REFERRING PROVIDER: Tarry Kos, MD  REFERRING DIAG: [phone] (ICD-10-CM) - Primary osteoarthritis of left knee  THERAPY DIAG:  Stiffness of left knee, not elsewhere classified  Acute pain of left knee  Difficulty in walking, not elsewhere classified  Localized edema  Rationale for Evaluation and Treatment: Rehabilitation  ONSET DATE: 01/05/2023  SUBJECTIVE:   SUBJECTIVE STATEMENT:  I feel like I might be able to make it to 100* today. Still working hard at home on bending the knee, it feels like its  loosening up. Sleeping better.  PERTINENT HISTORY: Hospital Course: Craig House is an 62 y.o. male who was admitted 01/09/2023 for operative treatment ofPrimary osteoarthritis of left knee. Patient has severe unremitting pain that affects sleep, daily activities, and work/hobbies. After pre-op clearance the patient was taken to the operating room on 01/09/2023 and underwent  Procedure(s): LEFT TOTAL KNEE ARTHROPLASTY.   PAIN:  Are you having pain? Yes: NPRS scale: 2/10 Pain location: L knee  Pain description: stiffness/tightness especially medial knee Aggravating factors: stretching and bending  Relieving factors: ice   PRECAUTIONS: Fall  WEIGHT BEARING RESTRICTIONS: No  FALLS:  Has patient fallen in last 6 months? 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   PLOF: Independent, Independent with basic ADLs, Independent with gait, and Independent with transfers  PATIENT GOALS: get movement and mobility back   NEXT MD VISIT: Referring in 4 weeks   OBJECTIVE:   DIAGNOSTIC FINDINGS:   PATIENT SURVEYS:  FOTO 22  COGNITION: Overall cognitive  status: Within functional limits for tasks assessed     SENSATION: Not tested  EDEMA:   Appropriate for post-op state   MUSCLE LENGTH:    POSTURE:   PALPATION:   LOWER EXTREMITY ROM:  Active ROM = A Passive ROM = P Right eval Left eval Left 01/30/23 Left 02/03/23 Left 02/06/23 Left 02/14/23 Left 02/17/23 Left 02/20/23 Left 02/24/23 Left 03/01/23  Hip flexion            Hip extension            Hip abduction            Hip adduction            Hip internal rotation            Hip external rotation            Knee  flexion  73* A: 80 P: 85 Active 92 A: 90 P: 95 AAROM: 90*, A: 80* AAROM 90*, AROM 80* AROM 94 PROM 99 88* AROM, AAROM 99* 93* AROM, AAROM 99*  Knee extension  15* A: 12 P: 10 -12 A: 12 P: 10 A 5* A supine 12*   A seated at edge of mat table 10*  Ankle dorsiflexion            Ankle plantarflexion            Ankle inversion            Ankle eversion             (Blank rows = not tested)  LOWER EXTREMITY MMT:  MMT Right eval Left eval R  02/17/23 L 02/17/23  Hip flexion 4+ 3+ pain limited  4+ 4+  Hip extension      Hip abduction      Hip adduction      Hip internal rotation      Hip external rotation      Knee flexion 4- 3+ 4+ 4-  Knee extension 5 4- 5 4  Ankle dorsiflexion 5 5    Ankle plantarflexion      Ankle inversion      Ankle eversion       (Blank rows = not tested)  LOWER EXTREMITY SPECIAL TESTS:    FUNCTIONAL TESTS:  Eval:  Timed up and go (TUG): 23 seconds RW  3 minute walk test: 344ft with RW    02/17/23- TUG 16.5 seconds, hurry-cane; 489ft hurry-cane  GAIT: Distance walked: 381ft  Assistive device utilized: Environmental consultant - 2 wheeled Level of assistance: Modified independence Comments: flexed at hips, antalgic with less wt shift to left, limited ROM in L knee in stance and swing phases    TODAY'S TREATMENT:                                                                                                                              DATE:   03/01/23  TherEx  Precor seat  5 full rotation x6 minutes cues to avoid hip compensations Knee flexion stretch on platform 10x10 second holds Shuttle LE press 75#  B up, L down eccentric lower + 5 second holds for flexion- to 98*  Forward step ups 6 inch box x15 L LE Lateral step ups 6 inch box x15 L LE  Forward step downs 6 inch box x10 L LE with 3 second holds for flexion (able to flex knee to 100* during exercise) HS stretches standing 4x30 seconds     02/24/23  TherEx  Scifit bike seat 6 progressing to seat 5  full rotations Knee flexion stretch 10x10 second holds 8 inch box Forward step downs 6 inch box BUE support on // bars L LE only x15  Squats with sink into knee flexion stretch x10 with 5 second holds Quad stretch with bent knee on table 3x30 seconds  Shuttle DL press 40# up, L LE lower down with 5 second holds into flexion stretch x12- to 98* flexion   Manual   Knee AAROM for flexion on high mat table with overpressure from PT    02/22/23  TherEx  Scifit bike seat 6 --> seat 5 x8 minutes full rotations for ROM Seated on high mat table- knee self flexion stretch x15 five second holds Shuttle LE press 50# x12 with 5 second flexion stretch at bottom  Shuttle DL press 98# up, L LE lower down with 5 second x10 flexion stretch at bottom  Forward step downs 6 inch step BUE support on // bars L LE only x10  Standing knee flexion lunge stretch 6 inch box x10 5 second holds    Manual  Knee AAROM with OP + some joint distraction to tolerance        02/20/23  TherEx  Scifit bike seat 7 progressing to seat 6 x 10 minutes total Step ups on 4 inch step leading with left and down in front leading with Rt X 10 reps using UE support Standing knee flexion lunge stretch with left foot on treadmill 5 sec X 10 Shuttle LE press L LE 50# x10 with 5  second flexion stretch, DL press 11# X 10 5 sec flexion stretch Sit to stand with knee flexion stretch using wall to block feet X 10 seconds using UE support Seated knee flexion AAROM 5 sec X 10  Manual therapy: Lt knee PROM with overpressure to tolerance and knee flexion mobs    02/17/23   FOTO 54  Objective measures/goal review for progress note + appropriate education  TherEx  Scifit bike seat 7 progressing to seat 6 full rotations x6 minutes LAQs 7# x12 with 3 second holds  SLRs + quad set 2# x15       Manual  Tib on femur grade III mobs for flexion 5 rounds Knee flexion AAROM x10 edge of mat table     02/14/23  TherEx  SciFit bike seat 7 progressing to seat 6,  full rotations x6 minutes Self knee flexion stretch 15 x5 second holds  Discussed floor bikes on [employer]/use at home to keep working on ROM, also discussed use of tennis ball massage to help with quad spasms  Knee flexion self stretch 10x10 second holds 8 inch box  Quad stretch supine at edge of table 4x30 seconds Single leg LE press 50# + 5 second flexion stretch for ROM LLE 5x10       PATIENT EDUCATION:  Education details: POC, HEP, exam findings, edema management, general progression through PT after  TKR, elevation strategies at home Person educated: Patient and significant other  Education method: Explanation, Demonstration, and Handouts Education comprehension: verbalized understanding, returned demonstration, and needs further education  HOME EXERCISE PROGRAM: Access Code: [email or web address] URL: [email or web address] Date: 02/03/2023 Prepared by: Pauletta Browns  Exercises - Seated Long Arc Quad  - 4-5 x daily - 7 x weekly - 2 sets - 10 reps - 3-5 sec hold - Seated Small Alternating Straight Leg Lifts with Heel Touch  - 4-5 x daily - 7 x weekly - 2 sets - 10 reps - Supine Heel Slide with Strap  - 4-5 x daily - 7 x weekly - 10 reps - 5 seconds hold - Sit to Stand with Counter Support  - 4-5 x daily - 7 x weekly - 2 sets - 10 reps - Heel Raises with Counter Support  - 4-5 x daily - 7 x weekly - 2 sets - 10 reps - Supine Quadricep Sets  - 5 x daily - 7 x weekly - 2 sets - 10 reps - 5 second hold - Seated Passive Knee Extension  - 3 x daily - 7 x weekly - 1 sets - 1 reps - 3-5 minutes hold  ASSESSMENT:  CLINICAL IMPRESSION:   [name] arrives today doing OK, has been working hard at home. Discussed scar massage today, otherwise continued to progress flexion and overall strength as able and tolerated this session. ROM coming along slow but well and consistently.     OBJECTIVE IMPAIRMENTS: Abnormal gait,  decreased activity tolerance, decreased balance, decreased knowledge of use of DME, decreased mobility, difficulty walking, decreased ROM, decreased strength, hypomobility, increased edema, increased fascial restrictions, impaired flexibility, and pain.   ACTIVITY LIMITATIONS: standing, squatting, stairs, transfers, and locomotion level  PARTICIPATION LIMITATIONS: driving, shopping, community activity, occupation, and yard work  PERSONAL FACTORS: Age, Behavior pattern, Education, Fitness, Past/current experiences, and Time since onset of injury/illness/exacerbation are also affecting patient's functional outcome.   REHAB POTENTIAL: Good  CLINICAL DECISION MAKING: Stable/uncomplicated  EVALUATION COMPLEXITY: Low   GOALS: Goals reviewed with patient? Yes  SHORT TERM GOALS: Target date: 02/21/2023   Will be compliant with progressive appropriate HEP  Baseline: Goal status: MET 02/06/23  2.  L knee extension AROM to be no more than 5* and flexion AROM to be at least 90* Baseline:  Goal status: Partially Met 02/17/23  3.  Will be able to ambulate in home distances with LRAD without significant gait deviation Baseline:  Goal status: PARTIALLY MET 02/17/23- mild gait impairment   4.  Will demonstrate equal weight bearing BLEs with functional transfers and no offshift from L LE  Baseline:  Goal status: MET 02/17/23    LONG TERM GOALS: Target date: 03/21/2023    MMT to have improved by at least 1 grade in all weak groups  Baseline:  Goal status: IN PROGRESS 02/17/23  2.  L knee flexion AROM to be at least 105*  Baseline:  Goal status: IN PROGRESS 02/17/23  3.  Will be able to ambulate at least 432ft with LRAD in to show improved functional mobility  Baseline:  Goal status: MET 02/17/23  4.  Will be able to complete TUG in 13 seconds or less with LRAD in order to show improved functional mobility and balance  Baseline:  Goal status: IN PROGRESS 02/17/23  5.  Will be able to  reciprocal ascend/descend 7 steps with no increase in pain and U rail to improve home and community access  Baseline:  Goal  status: IN PROGRESS4/5/24    PLAN:  PT FREQUENCY:  3x/week for first 4 weeks, then 2x/week for next 4 weeks   PT DURATION: 8 weeks  PLANNED INTERVENTIONS: Therapeutic exercises, Therapeutic activity, Neuromuscular re-education, Balance training, Gait training, Patient/Family education, Self Care, Joint mobilization, Stair training, DME instructions, Aquatic Therapy, Dry Needling, Electrical stimulation, Cryotherapy, Moist heat, Taping, Vasopneumatic device, Ultrasound, Ionotophoresis 4mg /ml Dexamethasone, Manual therapy, and Re-evaluation  PLAN FOR NEXT SESSION: Knee flexion ROM focus and overall knee strength and gait as tolerated. Continue Precor instead of Scifit. More extension work, still lacking   Nedra Hai PT DPT PN2

## 2023-03-03 ENCOUNTER — Ambulatory Visit (INDEPENDENT_AMBULATORY_CARE_PROVIDER_SITE_OTHER): Payer: Managed Care, Other (non HMO) | Admitting: Physical Therapy

## 2023-03-03 ENCOUNTER — Encounter: Payer: Self-pay | Admitting: Physical Therapy

## 2023-03-03 DIAGNOSIS — R600 Localized edema: Secondary | ICD-10-CM

## 2023-03-03 DIAGNOSIS — R262 Difficulty in walking, not elsewhere classified: Secondary | ICD-10-CM

## 2023-03-03 DIAGNOSIS — M25662 Stiffness of left knee, not elsewhere classified: Secondary | ICD-10-CM | POA: Diagnosis not present

## 2023-03-03 DIAGNOSIS — M25562 Pain in left knee: Secondary | ICD-10-CM

## 2023-03-03 NOTE — Therapy (Signed)
OUTPATIENT PHYSICAL THERAPY LOWER EXTREMITY  TREATMENT NOTE   Patient Name: Craig House MRN: [id number] DOB:Aug 21, 1961, 62 y.o., male Today's Date: 03/03/2023  END OF SESSION:  PT End of Session - 03/03/23 1523     Visit Number 15    Number of Visits 21    Date for PT Re-Evaluation 03/21/23    Authorization Type [employer]    Authorization - Number of Visits 90    PT Start Time 1517    PT Stop Time 1557    PT Time Calculation (min) 40 min    Activity Tolerance Patient tolerated treatment well    Behavior During Therapy [facility] for tasks assessed/performed                         Past Medical History:  Diagnosis Date   Allergy    spring   Anemia    Asthma    Diabetes mellitus without complication    type II   Hyperplastic colon polyp    Hypertension    Obesity    Past Surgical History:  Procedure Laterality Date   KNEE ARTHROSCOPY Bilateral    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   Procedure: LEFT TOTAL KNEE ARTHROPLASTY;  Surgeon: Tarry Kos, MD;  Location: [facility] OR;  Service: Orthopedics;  Laterality: Left;   Patient Active Problem List   Diagnosis Date Noted   Primary osteoarthritis of left knee 01/09/2023   Status post total left knee replacement 01/09/2023   Bilateral primary osteoarthritis of knee 06/19/2018   Mild intermittent asthma, uncomplicated 01/18/2016   HTN (hypertension) 12/31/2011    PCP: Marisue Brooklyn   REFERRING PROVIDER: Tarry Kos, MD  REFERRING DIAG: [phone] (ICD-10-CM) - Primary osteoarthritis of left knee  THERAPY DIAG:  Stiffness of left knee, not elsewhere classified  Difficulty in walking, not elsewhere classified  Acute pain of left knee  Localized edema  Rationale for Evaluation and Treatment: Rehabilitation  ONSET DATE: 01/05/2023  SUBJECTIVE:   SUBJECTIVE STATEMENT:  I feel like the knee is a little more tight today, otherwise all is good.   PERTINENT HISTORY: Hospital Course: Craig House is an 62 y.o. male who was admitted 01/09/2023 for operative treatment ofPrimary osteoarthritis of left knee. Patient has severe unremitting pain that affects sleep, daily activities, and work/hobbies. After pre-op clearance the patient was taken to the operating room on 01/09/2023 and underwent  Procedure(s): LEFT TOTAL KNEE ARTHROPLASTY.   PAIN:  Are you having pain? Yes: NPRS scale: 2/10 Pain location: L knee  Pain description: stiffness/tightness especially medial knee Aggravating factors: stretching and bending  Relieving factors: ice   PRECAUTIONS: Fall  WEIGHT BEARING RESTRICTIONS: No  FALLS:  Has patient fallen in last 6 months? 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   PLOF: Independent, Independent with basic ADLs, Independent with gait, and Independent with transfers  PATIENT GOALS: get movement and mobility back   NEXT MD VISIT: Referring in 4 weeks   OBJECTIVE:   DIAGNOSTIC FINDINGS:   PATIENT SURVEYS:  FOTO 43  COGNITION: Overall cognitive status: Within functional limits for tasks assessed     SENSATION: Not  tested  EDEMA:   Appropriate for post-op state   MUSCLE LENGTH:    POSTURE:   PALPATION:   LOWER EXTREMITY ROM:  Active ROM = A Passive ROM = P Right eval Left eval Left 01/30/23 Left 02/03/23 Left 02/06/23 Left 02/14/23 Left 02/17/23 Left 02/20/23 Left 02/24/23 Left 03/01/23  Hip flexion            Hip extension            Hip abduction            Hip adduction            Hip internal rotation            Hip external rotation            Knee flexion  73* A: 80 P: 85 Active 92 A: 90 P: 95 AAROM: 90*, A: 80* AAROM 90*, AROM  80* AROM 94 PROM 99 88* AROM, AAROM 99* 93* AROM, AAROM 99*  Knee extension  15* A: 12 P: 10 -12 A: 12 P: 10 A 5* A supine 12*   A seated at edge of mat table 10*  Ankle dorsiflexion            Ankle plantarflexion            Ankle inversion            Ankle eversion             (Blank rows = not tested)  LOWER EXTREMITY MMT:  MMT Right eval Left eval R  02/17/23 L 02/17/23  Hip flexion 4+ 3+ pain limited  4+ 4+  Hip extension      Hip abduction      Hip adduction      Hip internal rotation      Hip external rotation      Knee flexion 4- 3+ 4+ 4-  Knee extension 5 4- 5 4  Ankle dorsiflexion 5 5    Ankle plantarflexion      Ankle inversion      Ankle eversion       (Blank rows = not tested)  LOWER EXTREMITY SPECIAL TESTS:    FUNCTIONAL TESTS:  Eval:  Timed up and go (TUG): 23 seconds RW  3 minute walk test: 313ft with RW    02/17/23- TUG 16.5 seconds, hurry-cane; 47ft hurry-cane  GAIT: Distance walked: 352ft  Assistive device utilized: Environmental consultant - 2 wheeled Level of assistance: Modified independence Comments: flexed at hips, antalgic with less wt shift to left, limited ROM in L knee in stance and swing phases    TODAY'S TREATMENT:                                                                                                                              DATE:   03/03/23  TherEx  Precor bike seat 5 full rotations x6 minutes cues to avoid hip compensations  Knee  flexion stretch on 8 inch box 10x10 second holds- 102* today HS stretches standing 8 inch box 3x30 seconds TKEs into blue TB standing x15 with 3 second holds  Knee extension stretch propped up on chair x4 minutes with 4#  Forward step downs 6 inch box x15 with 5 second holds  HS curls with tap on 6 inch box x15   03/01/23  TherEx  Precor seat 5 full rotation x6 minutes cues to avoid hip compensations Knee flexion stretch on platform 10x10 second holds Shuttle LE press 75#  B up, L down eccentric  lower + 5 second holds for flexion- to 98*  Forward step ups 6 inch box x15 L LE Lateral step ups 6 inch box x15 L LE  Forward step downs 6 inch box x10 L LE with 3 second holds for flexion (able to flex knee to 100* during exercise) HS stretches standing 4x30 seconds     02/24/23  TherEx  Scifit bike seat 6 progressing to seat 5 full rotations Knee flexion stretch 10x10 second holds 8 inch box Forward step downs 6 inch box BUE support on // bars L LE only x15  Squats with sink into knee flexion stretch x10 with 5 second holds Quad stretch with bent knee on table 3x30 seconds  Shuttle DL press 40# up, L LE lower down with 5 second holds into flexion stretch x12- to 98* flexion   Manual   Knee AAROM for flexion on high mat table with overpressure from PT    02/22/23  TherEx  Scifit bike seat 6 --> seat 5 x8 minutes full rotations for ROM Seated on high mat table- knee self flexion stretch x15 five second holds Shuttle LE press 50# x12 with 5 second flexion stretch at bottom  Shuttle DL press 98# up, L LE lower down with 5 second x10 flexion stretch at bottom  Forward step downs 6 inch step BUE support on // bars L LE only x10  Standing knee flexion lunge stretch 6 inch box x10 5 second holds    Manual  Knee AAROM with OP + some joint distraction to tolerance        02/20/23  TherEx  Scifit bike seat 7 progressing to seat 6 x 10 minutes total Step ups on 4 inch step leading with left and down in front leading with Rt X 10 reps using UE support Standing knee flexion lunge stretch with left foot on treadmill 5 sec X 10 Shuttle LE press L LE 50# x10 with 5  second flexion stretch, DL press 11# X 10 5 sec flexion stretch Sit to stand with knee flexion stretch using wall to block feet X 10 seconds using UE support Seated knee flexion AAROM 5 sec X 10  Manual therapy: Lt knee PROM with overpressure to tolerance and knee flexion mobs    02/17/23   FOTO  54  Objective measures/goal review for progress note + appropriate education  TherEx  Scifit bike seat 7 progressing to seat 6 full rotations x6 minutes LAQs 7# x12 with 3 second holds  SLRs + quad set 2# x15       Manual  Tib on femur grade III mobs for flexion 5 rounds Knee flexion AAROM x10 edge of mat table    02/14/23  TherEx  SciFit bike seat 7 progressing to seat 6,  full rotations x6 minutes Self knee flexion stretch 15 x5 second holds  Discussed floor bikes on [employer]/use at home to keep working  on ROM, also discussed use of tennis ball massage to help with quad spasms  Knee flexion self stretch 10x10 second holds 8 inch box  Quad stretch supine at edge of table 4x30 seconds Single leg LE press 50# + 5 second flexion stretch for ROM LLE 5x10       PATIENT EDUCATION:  Education details: POC, HEP, exam findings, edema management, general progression through PT after TKR, elevation strategies at home Person educated: Patient and significant other  Education method: Explanation, Demonstration, and Handouts Education comprehension: verbalized understanding, returned demonstration, and needs further education  HOME EXERCISE PROGRAM:  Access Code: [email or web address] URL: [email or web address] Date: 03/03/2023 Prepared by: Nedra Hai  Exercises - Seated Long Arc Quad  - 4-5 x daily - 7 x weekly - 2 sets - 10 reps - 3-5 sec hold - Seated Small Alternating Straight Leg Lifts with Heel Touch  - 4-5 x daily - 7 x weekly - 2 sets - 10 reps - Supine Heel Slide with Strap  - 4-5 x daily - 7 x weekly - 10 reps - 5 seconds hold - Sit to Stand with Counter Support  - 4-5 x daily - 7 x weekly - 2 sets - 10 reps - Heel Raises with Counter Support  - 4-5 x daily - 7 x weekly - 2 sets - 10 reps - Supine Quadricep Sets  - 5 x daily - 7 x weekly - 2 sets - 10 reps - 5 second hold - Seated Passive Knee Extension  - 3 x daily - 7 x weekly - 1 sets - 1 reps - 3-5 minutes  hold - Hooklying Hamstring Stretch with Strap  - 2-3 x daily - 7 x weekly - 1 sets - 3 reps - 30 hold - Forward Step Down with Heel Tap and Counter Support  - 2-3 x daily - 7 x weekly - 1 sets - 10 reps - Standing Terminal Knee Extension with Resistance  - 2-3 x daily - 7 x weekly - 1 sets - 10 reps - 5 hold  ASSESSMENT:  CLINICAL IMPRESSION:   [name] arrives today doing OK, knee is a bit tighter today. Continued working on flexion and extension ROM as well as general functional strengthening  as per POC. Will continue efforts.    OBJECTIVE IMPAIRMENTS: Abnormal gait, decreased activity tolerance, decreased balance, decreased knowledge of use of DME, decreased mobility, difficulty walking, decreased ROM, decreased strength, hypomobility, increased edema, increased fascial restrictions, impaired flexibility, and pain.   ACTIVITY LIMITATIONS: standing, squatting, stairs, transfers, and locomotion level  PARTICIPATION LIMITATIONS: driving, shopping, community activity, occupation, and yard work  PERSONAL FACTORS: Age, Behavior pattern, Education, Fitness, Past/current experiences, and Time since onset of injury/illness/exacerbation are also affecting patient's functional outcome.   REHAB POTENTIAL: Good  CLINICAL DECISION MAKING: Stable/uncomplicated  EVALUATION COMPLEXITY: Low   GOALS: Goals reviewed with patient? Yes  SHORT TERM GOALS: Target date: 02/21/2023   Will be compliant with progressive appropriate HEP  Baseline: Goal status: MET 02/06/23  2.  L knee extension AROM to be no more than 5* and flexion AROM to be at least 90* Baseline:  Goal status: Partially Met 02/17/23  3.  Will be able to ambulate in home distances with LRAD without significant gait deviation Baseline:  Goal status: PARTIALLY MET 02/17/23- mild gait impairment   4.  Will demonstrate equal weight bearing BLEs with functional transfers and no offshift from L LE  Baseline:  Goal status: MET  02/17/23  LONG TERM GOALS: Target date: 03/21/2023    MMT to have improved by at least 1 grade in all weak groups  Baseline:  Goal status: IN PROGRESS 02/17/23  2.  L knee flexion AROM to be at least 105*  Baseline:  Goal status: IN PROGRESS 02/17/23  3.  Will be able to ambulate at least 496ft with LRAD in to show improved functional mobility  Baseline:  Goal status: MET 02/17/23  4.  Will be able to complete TUG in 13 seconds or less with LRAD in order to show improved functional mobility and balance  Baseline:  Goal status: IN PROGRESS 02/17/23  5.  Will be able to reciprocal ascend/descend 7 steps with no increase in pain and U rail to improve home and community access  Baseline:  Goal status: IN PROGRESS4/5/24    PLAN:  PT FREQUENCY:  3x/week for first 4 weeks, then 2x/week for next 4 weeks   PT DURATION: 8 weeks  PLANNED INTERVENTIONS: Therapeutic exercises, Therapeutic activity, Neuromuscular re-education, Balance training, Gait training, Patient/Family education, Self Care, Joint mobilization, Stair training, DME instructions, Aquatic Therapy, Dry Needling, Electrical stimulation, Cryotherapy, Moist heat, Taping, Vasopneumatic device, Ultrasound, Ionotophoresis 4mg /ml Dexamethasone, Manual therapy, and Re-evaluation  PLAN FOR NEXT SESSION: Knee flexion ROM focus and overall knee strength and gait as tolerated. Continue Precor bike. More extension work, still lacking   Nedra Hai PT DPT PN2

## 2023-03-07 ENCOUNTER — Encounter: Payer: Self-pay | Admitting: Physical Therapy

## 2023-03-07 ENCOUNTER — Ambulatory Visit: Payer: Managed Care, Other (non HMO) | Admitting: Physical Therapy

## 2023-03-07 DIAGNOSIS — R600 Localized edema: Secondary | ICD-10-CM

## 2023-03-07 DIAGNOSIS — R262 Difficulty in walking, not elsewhere classified: Secondary | ICD-10-CM | POA: Diagnosis not present

## 2023-03-07 DIAGNOSIS — M25562 Pain in left knee: Secondary | ICD-10-CM | POA: Diagnosis not present

## 2023-03-07 DIAGNOSIS — M25662 Stiffness of left knee, not elsewhere classified: Secondary | ICD-10-CM | POA: Diagnosis not present

## 2023-03-07 NOTE — Therapy (Signed)
OUTPATIENT PHYSICAL THERAPY LOWER EXTREMITY  TREATMENT NOTE   Patient Name: Craig House MRN: [id number] DOB:[birth year], 62 y.o., male Today's Date: 03/07/2023  END OF SESSION:  PT End of Session - 03/07/23 1348     Visit Number 16    Number of Visits 21    Date for PT Re-Evaluation 03/21/23    Authorization Type [employer]    Authorization - Number of Visits 90    PT Start Time 1343    PT Stop Time 1425    PT Time Calculation (min) 42 min    Activity Tolerance Patient tolerated treatment well    Behavior During Therapy [facility] for tasks assessed/performed                         Past Medical History:  Diagnosis Date   Allergy    spring   Anemia    Asthma    Diabetes mellitus without complication    type II   Hyperplastic colon polyp    Hypertension    Obesity    Past Surgical History:  Procedure Laterality Date   KNEE ARTHROSCOPY Bilateral    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   Procedure: LEFT TOTAL KNEE ARTHROPLASTY;  Surgeon: Tarry Kos, MD;  Location: [facility] OR;  Service: Orthopedics;  Laterality: Left;   Patient Active Problem List   Diagnosis Date Noted   Primary osteoarthritis of left knee 01/09/2023   Status post total left knee replacement 01/09/2023   Bilateral primary osteoarthritis of knee 06/19/2018   Mild intermittent asthma, uncomplicated 01/18/2016   HTN (hypertension) 12/31/2011    PCP: Marisue Brooklyn   REFERRING PROVIDER: Tarry Kos, MD  REFERRING DIAG: [phone] (ICD-10-CM) - Primary osteoarthritis of left knee  THERAPY DIAG:  Stiffness of left knee, not elsewhere classified  Difficulty in walking, not elsewhere classified  Acute pain of left knee  Localized edema  Rationale for Evaluation and Treatment: Rehabilitation  ONSET DATE: 01/05/2023  SUBJECTIVE:   SUBJECTIVE STATEMENT:  Denies pain but does still report stiffness today  PERTINENT HISTORY: Hospital Course: Craig House is an 62 y.o.  male who was admitted 01/09/2023 for operative treatment ofPrimary osteoarthritis of left knee. Patient has severe unremitting pain that affects sleep, daily activities, and work/hobbies. After pre-op clearance the patient was taken to the operating room on 01/09/2023 and underwent  Procedure(s): LEFT TOTAL KNEE ARTHROPLASTY.   PAIN:  Are you having pain? Yes: NPRS scale: 2/10 Pain location: L knee  Pain description: stiffness/tightness especially medial knee Aggravating factors: stretching and bending  Relieving factors: ice   PRECAUTIONS: Fall  WEIGHT BEARING RESTRICTIONS: No  FALLS:  Has patient fallen in last 6 months? 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   PLOF: Independent, Independent with basic ADLs, Independent with gait, and Independent with transfers  PATIENT GOALS: get movement and mobility back   NEXT MD VISIT: Referring in 4 weeks   OBJECTIVE:   DIAGNOSTIC FINDINGS:   PATIENT SURVEYS:  FOTO 28  COGNITION: Overall cognitive status: Within functional limits for tasks assessed     SENSATION: Not tested  EDEMA:   Appropriate for post-op  state   MUSCLE LENGTH:    POSTURE:   PALPATION:   LOWER EXTREMITY ROM:  Active ROM = A Passive ROM = P Right eval Left eval Left 01/30/23 Left 02/03/23 Left 02/06/23 Left 02/14/23 Left 02/17/23 Left 02/20/23 Left 02/24/23 Left 03/01/23  Hip flexion            Hip extension            Hip abduction            Hip adduction            Hip internal rotation            Hip external rotation            Knee flexion  73* A: 80 P: 85 Active 92 A: 90 P: 95 AAROM: 90*, A: 80* AAROM 90*, AROM 80* AROM 94 PROM 99 88*  AROM, AAROM 99* 93* AROM, AAROM 99*  Knee extension  15* A: 12 P: 10 -12 A: 12 P: 10 A 5* A supine 12*   A seated at edge of mat table 10*  Ankle dorsiflexion            Ankle plantarflexion            Ankle inversion            Ankle eversion             (Blank rows = not tested)  LOWER EXTREMITY MMT:  MMT Right eval Left eval R  02/17/23 L 02/17/23  Hip flexion 4+ 3+ pain limited  4+ 4+  Hip extension      Hip abduction      Hip adduction      Hip internal rotation      Hip external rotation      Knee flexion 4- 3+ 4+ 4-  Knee extension 5 4- 5 4  Ankle dorsiflexion 5 5    Ankle plantarflexion      Ankle inversion      Ankle eversion       (Blank rows = not tested)  LOWER EXTREMITY SPECIAL TESTS:    FUNCTIONAL TESTS:  Eval:  Timed up and go (TUG): 23 seconds RW  3 minute walk test: 369ft with RW    02/17/23- TUG 16.5 seconds, hurry-cane; 478ft hurry-cane  GAIT: Distance walked: 321ft  Assistive device utilized: Environmental consultant - 2 wheeled Level of assistance: Modified independence Comments: flexed at hips, antalgic with less wt shift to left, limited ROM in L knee in stance and swing phases    TODAY'S TREATMENT:                                                                                                                              DATE:   03/07/23  TherEx  Precor bike seat 5 full rotations x8 minutes  Knee flexion stretch on 8 inch box 10x10 second holds TRX squats with 5  sec hold at max knee flexion 5 sec 10 reps, 5 reps TRX lunges X 5 bilat with 5 sec hold HS stretches standing 8 inch box 3x30 seconds Knee extension stretch propped up on chair x4 minutes with 4#  Forward step ups with left leg and downs with Rt leg on 6 inch box x15 with one UE support Manual Seated knee flexion PROM with overpressure to tolerance 03/03/23   TherEx   Precor bike seat 5 full rotations x6 minutes cues to avoid hip compensations  Knee flexion stretch on 8 inch box 10x10  second holds- 102* today HS stretches standing 8 inch box 3x30 seconds TKEs into blue TB standing x15 with 3 second holds  Knee extension stretch propped up on chair x4 minutes with 4#  Forward step downs 6 inch box x15 with 5 second holds  HS curls with tap on 6 inch box x15   03/01/23  TherEx  Precor seat 5 full rotation x6 minutes cues to avoid hip compensations Knee flexion stretch on platform 10x10 second holds Shuttle LE press 75#  B up, L down eccentric lower + 5 second holds for flexion- to 98*  Forward step ups 6 inch box x15 L LE Lateral step ups 6 inch box x15 L LE  Forward step downs 6 inch box x10 L LE with 3 second holds for flexion (able to flex knee to 100* during exercise) HS stretches standing 4x30 seconds     02/24/23  TherEx  Scifit bike seat 6 progressing to seat 5 full rotations Knee flexion stretch 10x10 second holds 8 inch box Forward step downs 6 inch box BUE support on // bars L LE only x15  Squats with sink into knee flexion stretch x10 with 5 second holds Quad stretch with bent knee on table 3x30 seconds  Shuttle DL press 46# up, L LE lower down with 5 second holds into flexion stretch x12- to 98* flexion   Manual   Knee AAROM for flexion on high mat table with overpressure from PT     PATIENT EDUCATION:  Education details: POC, HEP, exam findings, edema management, general progression through PT after TKR, elevation strategies at home Person educated: Patient and significant other  Education method: Explanation, Demonstration, and Handouts Education comprehension: verbalized understanding, returned demonstration, and needs further education  HOME EXERCISE PROGRAM:  Access Code: [email or web address] URL: [email or web address] Date: 03/03/2023 Prepared by: Nedra Hai  Exercises - Seated Long Arc Quad  - 4-5 x daily - 7 x weekly - 2 sets - 10 reps - 3-5 sec hold - Seated Small Alternating Straight Leg Lifts with Heel Touch  - 4-5 x  daily - 7 x weekly - 2 sets - 10 reps - Supine Heel Slide with Strap  - 4-5 x daily - 7 x weekly - 10 reps - 5 seconds hold - Sit to Stand with Counter Support  - 4-5 x daily - 7 x weekly - 2 sets - 10 reps - Heel Raises with Counter Support  - 4-5 x daily - 7 x weekly - 2 sets - 10 reps - Supine Quadricep Sets  - 5 x daily - 7 x weekly - 2 sets - 10 reps - 5 second hold - Seated Passive Knee Extension  - 3 x daily - 7 x weekly - 1 sets - 1 reps - 3-5 minutes hold - Hooklying Hamstring Stretch with Strap  - 2-3 x daily - 7 x weekly - 1 sets - 3 reps -  30 hold - Forward Step Down with Heel Tap and Counter Support  - 2-3 x daily - 7 x weekly - 1 sets - 10 reps - Standing Terminal Knee Extension with Resistance  - 2-3 x daily - 7 x weekly - 1 sets - 10 reps - 5 hold  ASSESSMENT:  CLINICAL IMPRESSION:  We continued to work to maximize his knee ROM and strength within his tolerance. I did show him so TRX exercises that would be beneficial for him and he is interested in getting one of these at home so I instructed him on where he can find them.     OBJECTIVE IMPAIRMENTS: Abnormal gait, decreased activity tolerance, decreased balance, decreased knowledge of use of DME, decreased mobility, difficulty walking, decreased ROM, decreased strength, hypomobility, increased edema, increased fascial restrictions, impaired flexibility, and pain.   ACTIVITY LIMITATIONS: standing, squatting, stairs, transfers, and locomotion level  PARTICIPATION LIMITATIONS: driving, shopping, community activity, occupation, and yard work  PERSONAL FACTORS: Age, Behavior pattern, Education, Fitness, Past/current experiences, and Time since onset of injury/illness/exacerbation are also affecting patient's functional outcome.   REHAB POTENTIAL: Good  CLINICAL DECISION MAKING: Stable/uncomplicated  EVALUATION COMPLEXITY: Low   GOALS: Goals reviewed with patient? Yes  SHORT TERM GOALS: Target date: 02/21/2023   Will be  compliant with progressive appropriate HEP  Baseline: Goal status: MET 02/06/23  2.  L knee extension AROM to be no more than 5* and flexion AROM to be at least 90* Baseline:  Goal status: Partially Met 02/17/23  3.  Will be able to ambulate in home distances with LRAD without significant gait deviation Baseline:  Goal status: PARTIALLY MET 02/17/23- mild gait impairment   4.  Will demonstrate equal weight bearing BLEs with functional transfers and no offshift from L LE  Baseline:  Goal status: MET 02/17/23    LONG TERM GOALS: Target date: 03/21/2023    MMT to have improved by at least 1 grade in all weak groups  Baseline:  Goal status: IN PROGRESS 02/17/23  2.  L knee flexion AROM to be at least 105*  Baseline:  Goal status: IN PROGRESS 02/17/23  3.  Will be able to ambulate at least 42ft with LRAD in to show improved functional mobility  Baseline:  Goal status: MET 02/17/23  4.  Will be able to complete TUG in 13 seconds or less with LRAD in order to show improved functional mobility and balance  Baseline:  Goal status: IN PROGRESS 02/17/23  5.  Will be able to reciprocal ascend/descend 7 steps with no increase in pain and U rail to improve home and community access  Baseline:  Goal status: IN PROGRESS4/5/24    PLAN:  PT FREQUENCY:  3x/week for first 4 weeks, then 2x/week for next 4 weeks   PT DURATION: 8 weeks  PLANNED INTERVENTIONS: Therapeutic exercises, Therapeutic activity, Neuromuscular re-education, Balance training, Gait training, Patient/Family education, Self Care, Joint mobilization, Stair training, DME instructions, Aquatic Therapy, Dry Needling, Electrical stimulation, Cryotherapy, Moist heat, Taping, Vasopneumatic device, Ultrasound, Ionotophoresis 4mg /ml Dexamethasone, Manual therapy, and Re-evaluation  PLAN FOR NEXT SESSION: Knee ROM focus and overall knee strength and gait as tolerated. Continue Precor bike. More extension work, still lacking   Ivery Quale, PT, DPT 03/07/23 2:37 PM

## 2023-03-10 ENCOUNTER — Encounter: Payer: Self-pay | Admitting: Physical Therapy

## 2023-03-10 ENCOUNTER — Ambulatory Visit (INDEPENDENT_AMBULATORY_CARE_PROVIDER_SITE_OTHER): Payer: Managed Care, Other (non HMO) | Admitting: Physical Therapy

## 2023-03-10 DIAGNOSIS — M25662 Stiffness of left knee, not elsewhere classified: Secondary | ICD-10-CM

## 2023-03-10 DIAGNOSIS — R262 Difficulty in walking, not elsewhere classified: Secondary | ICD-10-CM | POA: Diagnosis not present

## 2023-03-10 DIAGNOSIS — R600 Localized edema: Secondary | ICD-10-CM | POA: Diagnosis not present

## 2023-03-10 DIAGNOSIS — M25562 Pain in left knee: Secondary | ICD-10-CM | POA: Diagnosis not present

## 2023-03-10 NOTE — Therapy (Signed)
OUTPATIENT PHYSICAL THERAPY LOWER EXTREMITY  TREATMENT NOTE   Patient Name: Craig House MRN: [id number] DOB:08-11-1961, 62 y.o., male Today's Date: 03/10/2023  END OF SESSION:  PT End of Session - 03/10/23 1446     Visit Number 17    Number of Visits 21    Date for PT Re-Evaluation 03/21/23    Authorization Type [employer]    Authorization - Number of Visits 90    PT Start Time 1433    PT Stop Time 1514    PT Time Calculation (min) 41 min    Activity Tolerance Patient tolerated treatment well    Behavior During Therapy [facility] for tasks assessed/performed                          Past Medical History:  Diagnosis Date   Allergy    spring   Anemia    Asthma    Diabetes mellitus without complication (HCC)    type II   Hyperplastic colon polyp    Hypertension    Obesity    Past Surgical History:  Procedure Laterality Date   KNEE ARTHROSCOPY Bilateral    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   Procedure: LEFT TOTAL KNEE ARTHROPLASTY;  Surgeon: Tarry Kos, MD;  Location: [facility] OR;  Service: Orthopedics;  Laterality: Left;   Patient Active Problem List   Diagnosis Date Noted   Primary osteoarthritis of left knee 01/09/2023   Status post total left knee replacement 01/09/2023   Bilateral primary osteoarthritis of knee 06/19/2018   Mild intermittent asthma, uncomplicated 01/18/2016   HTN (hypertension) 12/31/2011    PCP: Marisue Brooklyn   REFERRING PROVIDER: Tarry Kos, MD  REFERRING DIAG: [phone] (ICD-10-CM) - Primary osteoarthritis of left knee  THERAPY DIAG:  Stiffness of left knee, not elsewhere classified  Difficulty in walking, not elsewhere classified  Acute pain of left knee  Localized edema  Rationale for Evaluation and Treatment: Rehabilitation  ONSET DATE: 01/05/2023  SUBJECTIVE:   SUBJECTIVE STATEMENT:  Things are feeling pretty good, feeling stiff today. Nothing else really new. Kind of getting used to idea that  this will take awhile, still trying to push past comfort zone at home. Ordered TRX for at home.   PERTINENT HISTORY: Hospital Course: Fontaine Hehl is an 62 y.o. male who was admitted 01/09/2023 for operative treatment ofPrimary osteoarthritis of left knee. Patient has severe unremitting pain that affects sleep, daily activities, and work/hobbies. After pre-op clearance the patient was taken to the operating room on 01/09/2023 and underwent  Procedure(s): LEFT TOTAL KNEE ARTHROPLASTY.   PAIN:  Are you having pain? Yes: NPRS scale: 2/10 Pain location: L knee  Pain description: stiffness/tightness especially medial knee Aggravating factors: stretching and bending  Relieving factors: ice   PRECAUTIONS: Fall  WEIGHT BEARING RESTRICTIONS: No  FALLS:  Has patient fallen in last 6 months? 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   PLOF: Independent, Independent with basic ADLs, Independent with gait, and Independent with transfers  PATIENT GOALS: get movement and mobility back   NEXT MD VISIT: Referring in 4 weeks   OBJECTIVE:   DIAGNOSTIC FINDINGS:  PATIENT SURVEYS:  FOTO 43  COGNITION: Overall cognitive status: Within functional limits for tasks assessed     SENSATION: Not tested  EDEMA:   Appropriate for post-op state   MUSCLE LENGTH:    POSTURE:   PALPATION:   LOWER EXTREMITY ROM:  Active ROM = A Passive ROM = P Right eval Left eval Left 01/30/23 Left 02/03/23 Left 02/06/23 Left 02/14/23 Left 02/17/23 Left 02/20/23 Left 02/24/23 Left 03/01/23 Left 03/10/23  Hip flexion             Hip extension             Hip abduction             Hip adduction              Hip internal rotation             Hip external rotation             Knee flexion  73* A: 80 P: 85 Active 92 A: 90 P: 95 AAROM: 90*, A: 80* AAROM 90*, AROM 80* AROM 94 PROM 99 88* AROM, AAROM 99* 93* AROM, AAROM 99* 94* AROM, AAROM 102*   Knee extension  15* A: 12 P: 10 -12 A: 12 P: 10 A 5* A supine 12*   A seated at edge of mat table 10* AROM with LAQ 4-5*   Ankle dorsiflexion             Ankle plantarflexion             Ankle inversion             Ankle eversion              (Blank rows = not tested)  LOWER EXTREMITY MMT:  MMT Right eval Left eval R  02/17/23 L 02/17/23  Hip flexion 4+ 3+ pain limited  4+ 4+  Hip extension      Hip abduction      Hip adduction      Hip internal rotation      Hip external rotation      Knee flexion 4- 3+ 4+ 4-  Knee extension 5 4- 5 4  Ankle dorsiflexion 5 5    Ankle plantarflexion      Ankle inversion      Ankle eversion       (Blank rows = not tested)  LOWER EXTREMITY SPECIAL TESTS:    FUNCTIONAL TESTS:  Eval:  Timed up and go (TUG): 23 seconds RW  3 minute walk test: 354ft with RW    02/17/23- TUG 16.5 seconds, hurry-cane; 41ft hurry-cane  GAIT: Distance walked: 330ft  Assistive device utilized: Environmental consultant - 2 wheeled Level of assistance: Modified independence Comments: flexed at hips, antalgic with less wt shift to left, limited ROM in L knee in stance and swing phases    TODAY'S TREATMENT:  DATE:   03/10/23  TherEX  PreCor bike seat 4 x8 minutes full rotations  Knee flexion stretch on 8 inch box 10x10 second holds HS stretches 8 inch box standing 3x30 seconds L LE  Forward step ups 8 inch box x12 Lateral step ups 8 inch box x12 ROM check for flexion/extension  Hamstring curls green TB x15 with 3 seconds      03/07/23  TherEx  Precor bike seat 5 full rotations x8 minutes  Knee flexion  stretch on 8 inch box 10x10 second holds TRX squats with 5 sec hold at max knee flexion 5 sec 10 reps, 5 reps TRX lunges X 5 bilat with 5 sec hold HS stretches standing 8 inch box 3x30 seconds Knee extension stretch propped up on chair x4 minutes with 4#  Forward step ups with left leg and downs with Rt leg on 6 inch box x15 with one UE support Manual Seated knee flexion PROM with overpressure to tolerance 03/03/23   TherEx   Precor bike seat 5 full rotations x6 minutes cues to avoid hip compensations  Knee flexion stretch on 8 inch box 10x10 second holds- 102* today HS stretches standing 8 inch box 3x30 seconds TKEs into blue TB standing x15 with 3 second holds  Knee extension stretch propped up on chair x4 minutes with 4#  Forward step downs 6 inch box x15 with 5 second holds  HS curls with tap on 6 inch box x15   03/01/23  TherEx  Precor seat 5 full rotation x6 minutes cues to avoid hip compensations Knee flexion stretch on platform 10x10 second holds Shuttle LE press 75#  B up, L down eccentric lower + 5 second holds for flexion- to 98*  Forward step ups 6 inch box x15 L LE Lateral step ups 6 inch box x15 L LE  Forward step downs 6 inch box x10 L LE with 3 second holds for flexion (able to flex knee to 100* during exercise) HS stretches standing 4x30 seconds     02/24/23  TherEx  Scifit bike seat 6 progressing to seat 5 full rotations Knee flexion stretch 10x10 second holds 8 inch box Forward step downs 6 inch box BUE support on // bars L LE only x15  Squats with sink into knee flexion stretch x10 with 5 second holds Quad stretch with bent knee on table 3x30 seconds  Shuttle DL press 09# up, L LE lower down with 5 second holds into flexion stretch x12- to 98* flexion   Manual   Knee AAROM for flexion on high mat table with overpressure from PT     PATIENT EDUCATION:  Education details: POC, HEP, exam findings, edema management, general progression through PT  after TKR, elevation strategies at home Person educated: Patient and significant other  Education method: Explanation, Demonstration, and Handouts Education comprehension: verbalized understanding, returned demonstration, and needs further education  HOME EXERCISE PROGRAM:  Access Code: [email or web address] URL: [email or web address] Date: 03/03/2023 Prepared by: Nedra Hai  Exercises - Seated Long Arc Quad  - 4-5 x daily - 7 x weekly - 2 sets - 10 reps - 3-5 sec hold - Seated Small Alternating Straight Leg Lifts with Heel Touch  - 4-5 x daily - 7 x weekly - 2 sets - 10 reps - Supine Heel Slide with Strap  - 4-5 x daily - 7 x weekly - 10 reps - 5 seconds hold - Sit to Stand with Counter Support  - 4-5 x daily - 7 x weekly -  2 sets - 10 reps - Heel Raises with Counter Support  - 4-5 x daily - 7 x weekly - 2 sets - 10 reps - Supine Quadricep Sets  - 5 x daily - 7 x weekly - 2 sets - 10 reps - 5 second hold - Seated Passive Knee Extension  - 3 x daily - 7 x weekly - 1 sets - 1 reps - 3-5 minutes hold - Hooklying Hamstring Stretch with Strap  - 2-3 x daily - 7 x weekly - 1 sets - 3 reps - 30 hold - Forward Step Down with Heel Tap and Counter Support  - 2-3 x daily - 7 x weekly - 1 sets - 10 reps - Standing Terminal Knee Extension with Resistance  - 2-3 x daily - 7 x weekly - 1 sets - 10 reps - 5 hold  ASSESSMENT:  CLINICAL IMPRESSION:    [name] arrives doing well, remains motivated to work hard with HEP at home. Took knee ROM measures today, continues to make slow but steady progress. Will plan on re-assessment in 2 sessions.    OBJECTIVE IMPAIRMENTS: Abnormal gait, decreased activity tolerance, decreased balance, decreased knowledge of use of DME, decreased mobility, difficulty walking, decreased ROM, decreased strength, hypomobility, increased edema, increased fascial restrictions, impaired flexibility, and pain.   ACTIVITY LIMITATIONS: standing, squatting, stairs, transfers, and  locomotion level  PARTICIPATION LIMITATIONS: driving, shopping, community activity, occupation, and yard work  PERSONAL FACTORS: Age, Behavior pattern, Education, Fitness, Past/current experiences, and Time since onset of injury/illness/exacerbation are also affecting patient's functional outcome.   REHAB POTENTIAL: Good  CLINICAL DECISION MAKING: Stable/uncomplicated  EVALUATION COMPLEXITY: Low   GOALS: Goals reviewed with patient? Yes  SHORT TERM GOALS: Target date: 02/21/2023   Will be compliant with progressive appropriate HEP  Baseline: Goal status: MET 02/06/23  2.  L knee extension AROM to be no more than 5* and flexion AROM to be at least 90* Baseline:  Goal status: Partially Met 02/17/23  3.  Will be able to ambulate in home distances with LRAD without significant gait deviation Baseline:  Goal status: PARTIALLY MET 02/17/23- mild gait impairment   4.  Will demonstrate equal weight bearing BLEs with functional transfers and no offshift from L LE  Baseline:  Goal status: MET 02/17/23    LONG TERM GOALS: Target date: 03/21/2023    MMT to have improved by at least 1 grade in all weak groups  Baseline:  Goal status: IN PROGRESS 02/17/23  2.  L knee flexion AROM to be at least 105*  Baseline:  Goal status: IN PROGRESS 02/17/23  3.  Will be able to ambulate at least 420ft with LRAD in to show improved functional mobility  Baseline:  Goal status: MET 02/17/23  4.  Will be able to complete TUG in 13 seconds or less with LRAD in order to show improved functional mobility and balance  Baseline:  Goal status: IN PROGRESS 02/17/23  5.  Will be able to reciprocal ascend/descend 7 steps with no increase in pain and U rail to improve home and community access  Baseline:  Goal status: IN PROGRESS4/5/24    PLAN:  PT FREQUENCY:  3x/week for first 4 weeks, then 2x/week for next 4 weeks   PT DURATION: 8 weeks  PLANNED INTERVENTIONS: Therapeutic exercises, Therapeutic  activity, Neuromuscular re-education, Balance training, Gait training, Patient/Family education, Self Care, Joint mobilization, Stair training, DME instructions, Aquatic Therapy, Dry Needling, Electrical stimulation, Cryotherapy, Moist heat, Taping, Vasopneumatic device, Ultrasound,  Ionotophoresis 4mg /ml Dexamethasone, Manual therapy, and Re-evaluation  PLAN FOR NEXT SESSION: Knee ROM focus and overall knee strength and gait as tolerated. Continue Precor bike. More extension work, still lacking. Reassess/recert if needed in 2 visits  Nedra Hai PT DPT PN2

## 2023-03-15 ENCOUNTER — Ambulatory Visit: Payer: Managed Care, Other (non HMO) | Admitting: Physical Therapy

## 2023-03-15 ENCOUNTER — Encounter: Payer: Self-pay | Admitting: Physical Therapy

## 2023-03-15 DIAGNOSIS — R262 Difficulty in walking, not elsewhere classified: Secondary | ICD-10-CM | POA: Diagnosis not present

## 2023-03-15 DIAGNOSIS — R600 Localized edema: Secondary | ICD-10-CM | POA: Diagnosis not present

## 2023-03-15 DIAGNOSIS — M25662 Stiffness of left knee, not elsewhere classified: Secondary | ICD-10-CM

## 2023-03-15 DIAGNOSIS — M25562 Pain in left knee: Secondary | ICD-10-CM | POA: Diagnosis not present

## 2023-03-15 NOTE — Therapy (Signed)
OUTPATIENT PHYSICAL THERAPY LOWER EXTREMITY  TREATMENT NOTE   Patient Name: Craig House MRN: [id number] DOB:[birth year], 62 y.o., male Today's Date: 03/15/2023  END OF SESSION:  PT End of Session - 03/15/23 1352     Visit Number 18    Number of Visits 21    Date for PT Re-Evaluation 03/21/23    Authorization Type [employer]    Authorization - Number of Visits 90    PT Start Time 1348    PT Stop Time 1427    PT Time Calculation (min) 39 min    Activity Tolerance Patient tolerated treatment well    Behavior During Therapy [facility] for tasks assessed/performed                           Past Medical History:  Diagnosis Date   Allergy    spring   Anemia    Asthma    Diabetes mellitus without complication (HCC)    type II   Hyperplastic colon polyp    Hypertension    Obesity    Past Surgical History:  Procedure Laterality Date   KNEE ARTHROSCOPY Bilateral    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   Procedure: LEFT TOTAL KNEE ARTHROPLASTY;  Surgeon: Tarry Kos, MD;  Location: [facility] OR;  Service: Orthopedics;  Laterality: Left;   Patient Active Problem List   Diagnosis Date Noted   Primary osteoarthritis of left knee 01/09/2023   Status post total left knee replacement 01/09/2023   Bilateral primary osteoarthritis of knee 06/19/2018   Mild intermittent asthma, uncomplicated 01/18/2016   HTN (hypertension) 12/31/2011    PCP: Marisue Brooklyn   REFERRING PROVIDER: Tarry Kos, MD  REFERRING DIAG: [phone] (ICD-10-CM) - Primary osteoarthritis of left knee  THERAPY DIAG:  Stiffness of left knee, not elsewhere classified  Difficulty in walking, not elsewhere classified  Acute pain of left knee  Localized edema  Rationale for Evaluation and Treatment: Rehabilitation  ONSET DATE: 01/05/2023  SUBJECTIVE:   SUBJECTIVE STATEMENT:  Brother didn't press on my knee this weekend. Nothing new, still just working on my bending at home. Walked around  the track 2-3 times and knee did OK.   PERTINENT HISTORY: Hospital Course: Craig House is an 62 y.o. male who was admitted 01/09/2023 for operative treatment ofPrimary osteoarthritis of left knee. Patient has severe unremitting pain that affects sleep, daily activities, and work/hobbies. After pre-op clearance the patient was taken to the operating room on 01/09/2023 and underwent  Procedure(s): LEFT TOTAL KNEE ARTHROPLASTY.   PAIN:  Are you having pain? Yes: NPRS scale: 2/10 Pain location: L knee  Pain description: stiffness/tightness especially medial knee Aggravating factors: stretching and bending  Relieving factors: ice   PRECAUTIONS: Fall  WEIGHT BEARING RESTRICTIONS: No  FALLS:  Has patient fallen in last 6 months? 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   PLOF: Independent, Independent with basic ADLs, Independent with gait, and Independent with transfers  PATIENT GOALS: get movement and mobility back   NEXT MD VISIT: Referring in 4 weeks   OBJECTIVE:   DIAGNOSTIC FINDINGS:   PATIENT SURVEYS:  FOTO 53  COGNITION: Overall cognitive status: Within functional limits for tasks assessed     SENSATION: Not tested  EDEMA:   Appropriate for post-op state   MUSCLE LENGTH:    POSTURE:   PALPATION:   LOWER EXTREMITY ROM:  Active ROM = A Passive ROM = P Right eval Left eval Left 01/30/23 Left 02/03/23 Left 02/06/23 Left 02/14/23 Left 02/17/23 Left 02/20/23 Left 02/24/23 Left 03/01/23 Left 03/10/23  Hip flexion             Hip extension             Hip abduction             Hip adduction             Hip internal rotation             Hip external  rotation             Knee flexion  73* A: 80 P: 85 Active 92 A: 90 P: 95 AAROM: 90*, A: 80* AAROM 90*, AROM 80* AROM 94 PROM 99 88* AROM, AAROM 99* 93* AROM, AAROM 99* 94* AROM, AAROM 102*   Knee extension  15* A: 12 P: 10 -12 A: 12 P: 10 A 5* A supine 12*   A seated at edge of mat table 10* AROM with LAQ 4-5*   Ankle dorsiflexion             Ankle plantarflexion             Ankle inversion             Ankle eversion              (Blank rows = not tested)  LOWER EXTREMITY MMT:  MMT Right eval Left eval R  02/17/23 L 02/17/23  Hip flexion 4+ 3+ pain limited  4+ 4+  Hip extension      Hip abduction      Hip adduction      Hip internal rotation      Hip external rotation      Knee flexion 4- 3+ 4+ 4-  Knee extension 5 4- 5 4  Ankle dorsiflexion 5 5    Ankle plantarflexion      Ankle inversion      Ankle eversion       (Blank rows = not tested)  LOWER EXTREMITY SPECIAL TESTS:    FUNCTIONAL TESTS:  Eval:  Timed up and go (TUG): 23 seconds RW  3 minute walk test: 337ft with RW    02/17/23- TUG 16.5 seconds, hurry-cane; 448ft hurry-cane  GAIT: Distance walked: 362ft  Assistive device utilized: Environmental consultant - 2 wheeled Level of assistance: Modified independence Comments: flexed at hips, antalgic with less wt shift to left, limited ROM in L knee in stance and swing phases    TODAY'S TREATMENT:  DATE:   03/15/23  TherEx  Precor bike seat 4 --> seat 3 x8 minutes for ROM  Knee flexion stretches on chair 10x10 second holds HS stretches 3x30 seconds LLE  Hip flexor/quad stretch 4x30 seconds L LE Standing quad stretch on mat table 3x30 seconds Forward and lateral step ups 8 inch box x15 each L LE  End range flexion strengthening- butt kicks from foot resting on top of 8 inch box x15 with 2-3 second holds Forward step downs from 8 inch step x15 U UE  support Forward lunges 8 inch box x15 U UE support  Knee flexion AAROM with PT assist x10    03/10/23  TherEX  PreCor bike seat 4 x8 minutes full rotations  Knee flexion stretch on 8 inch box 10x10 second holds HS stretches 8 inch box standing 3x30 seconds L LE  Forward step ups 8 inch box x12 Lateral step ups 8 inch box x12 ROM check for flexion/extension  Hamstring curls green TB x15 with 3 seconds      03/07/23  TherEx  Precor bike seat 5 full rotations x8 minutes  Knee flexion stretch on 8 inch box 10x10 second holds TRX squats with 5 sec hold at max knee flexion 5 sec 10 reps, 5 reps TRX lunges X 5 bilat with 5 sec hold HS stretches standing 8 inch box 3x30 seconds Knee extension stretch propped up on chair x4 minutes with 4#  Forward step ups with left leg and downs with Rt leg on 6 inch box x15 with one UE support Manual Seated knee flexion PROM with overpressure to tolerance 03/03/23   TherEx   Precor bike seat 5 full rotations x6 minutes cues to avoid hip compensations  Knee flexion stretch on 8 inch box 10x10 second holds- 102* today HS stretches standing 8 inch box 3x30 seconds TKEs into blue TB standing x15 with 3 second holds  Knee extension stretch propped up on chair x4 minutes with 4#  Forward step downs 6 inch box x15 with 5 second holds  HS curls with tap on 6 inch box x15   03/01/23  TherEx  Precor seat 5 full rotation x6 minutes cues to avoid hip compensations Knee flexion stretch on platform 10x10 second holds Shuttle LE press 75#  B up, L down eccentric lower + 5 second holds for flexion- to 98*  Forward step ups 6 inch box x15 L LE Lateral step ups 6 inch box x15 L LE  Forward step downs 6 inch box x10 L LE with 3 second holds for flexion (able to flex knee to 100* during exercise) HS stretches standing 4x30 seconds     02/24/23  TherEx  Scifit bike seat 6 progressing to seat 5 full rotations Knee flexion stretch 10x10 second  holds 8 inch box Forward step downs 6 inch box BUE support on // bars L LE only x15  Squats with sink into knee flexion stretch x10 with 5 second holds Quad stretch with bent knee on table 3x30 seconds  Shuttle DL press 29# up, L LE lower down with 5 second holds into flexion stretch x12- to 98* flexion   Manual   Knee AAROM for flexion on high mat table with overpressure from PT     PATIENT EDUCATION:  Education details: POC, HEP, exam findings, edema management, general progression through PT after TKR, elevation strategies at home Person educated: Patient and significant other  Education method: Explanation, Demonstration, and Handouts Education comprehension: verbalized understanding, returned demonstration,  and needs further education  HOME EXERCISE PROGRAM:  Access Code: [email or web address] URL: [email or web address] Date: 03/03/2023 Prepared by: Nedra Hai  Exercises - Seated Long Arc Quad  - 4-5 x daily - 7 x weekly - 2 sets - 10 reps - 3-5 sec hold - Seated Small Alternating Straight Leg Lifts with Heel Touch  - 4-5 x daily - 7 x weekly - 2 sets - 10 reps - Supine Heel Slide with Strap  - 4-5 x daily - 7 x weekly - 10 reps - 5 seconds hold - Sit to Stand with Counter Support  - 4-5 x daily - 7 x weekly - 2 sets - 10 reps - Heel Raises with Counter Support  - 4-5 x daily - 7 x weekly - 2 sets - 10 reps - Supine Quadricep Sets  - 5 x daily - 7 x weekly - 2 sets - 10 reps - 5 second hold - Seated Passive Knee Extension  - 3 x daily - 7 x weekly - 1 sets - 1 reps - 3-5 minutes hold - Hooklying Hamstring Stretch with Strap  - 2-3 x daily - 7 x weekly - 1 sets - 3 reps - 30 hold - Forward Step Down with Heel Tap and Counter Support  - 2-3 x daily - 7 x weekly - 1 sets - 10 reps - Standing Terminal Knee Extension with Resistance  - 2-3 x daily - 7 x weekly - 1 sets - 10 reps - 5 hold  ASSESSMENT:  CLINICAL IMPRESSION:    [name] arrives doing well, still working hard at  home. Continued working on ROM and strength today. We will plan on re-assessment next session so we can narrow in on what we need to focus on for last couple of sessions moving forward vs potentially extending care.   OBJECTIVE IMPAIRMENTS: Abnormal gait, decreased activity tolerance, decreased balance, decreased knowledge of use of DME, decreased mobility, difficulty walking, decreased ROM, decreased strength, hypomobility, increased edema, increased fascial restrictions, impaired flexibility, and pain.   ACTIVITY LIMITATIONS: standing, squatting, stairs, transfers, and locomotion level  PARTICIPATION LIMITATIONS: driving, shopping, community activity, occupation, and yard work  PERSONAL FACTORS: Age, Behavior pattern, Education, Fitness, Past/current experiences, and Time since onset of injury/illness/exacerbation are also affecting patient's functional outcome.   REHAB POTENTIAL: Good  CLINICAL DECISION MAKING: Stable/uncomplicated  EVALUATION COMPLEXITY: Low   GOALS: Goals reviewed with patient? Yes  SHORT TERM GOALS: Target date: 02/21/2023   Will be compliant with progressive appropriate HEP  Baseline: Goal status: MET 02/06/23  2.  L knee extension AROM to be no more than 5* and flexion AROM to be at least 90* Baseline:  Goal status: Partially Met 02/17/23  3.  Will be able to ambulate in home distances with LRAD without significant gait deviation Baseline:  Goal status: PARTIALLY MET 02/17/23- mild gait impairment   4.  Will demonstrate equal weight bearing BLEs with functional transfers and no offshift from L LE  Baseline:  Goal status: MET 02/17/23    LONG TERM GOALS: Target date: 03/21/2023    MMT to have improved by at least 1 grade in all weak groups  Baseline:  Goal status: IN PROGRESS 02/17/23  2.  L knee flexion AROM to be at least 105*  Baseline:  Goal status: IN PROGRESS 02/17/23  3.  Will be able to ambulate at least 472ft with LRAD in to show improved  functional mobility  Baseline:  Goal status: MET 02/17/23  4.  Will be able to complete TUG in 13 seconds or less with LRAD in order to show improved functional mobility and balance  Baseline:  Goal status: IN PROGRESS 02/17/23  5.  Will be able to reciprocal ascend/descend 7 steps with no increase in pain and U rail to improve home and community access  Baseline:  Goal status: IN PROGRESS4/5/24    PLAN:  PT FREQUENCY:  3x/week for first 4 weeks, then 2x/week for next 4 weeks   PT DURATION: 8 weeks  PLANNED INTERVENTIONS: Therapeutic exercises, Therapeutic activity, Neuromuscular re-education, Balance training, Gait training, Patient/Family education, Self Care, Joint mobilization, Stair training, DME instructions, Aquatic Therapy, Dry Needling, Electrical stimulation, Cryotherapy, Moist heat, Taping, Vasopneumatic device, Ultrasound, Ionotophoresis 4mg /ml Dexamethasone, Manual therapy, and Re-evaluation  PLAN FOR NEXT SESSION: . Reassess/recert if needed next visit   Nedra Hai PT DPT PN2

## 2023-03-17 ENCOUNTER — Encounter: Payer: Self-pay | Admitting: Physical Therapy

## 2023-03-17 ENCOUNTER — Ambulatory Visit (INDEPENDENT_AMBULATORY_CARE_PROVIDER_SITE_OTHER): Payer: Managed Care, Other (non HMO) | Admitting: Physical Therapy

## 2023-03-17 DIAGNOSIS — M25562 Pain in left knee: Secondary | ICD-10-CM

## 2023-03-17 DIAGNOSIS — R262 Difficulty in walking, not elsewhere classified: Secondary | ICD-10-CM | POA: Diagnosis not present

## 2023-03-17 DIAGNOSIS — M25662 Stiffness of left knee, not elsewhere classified: Secondary | ICD-10-CM | POA: Diagnosis not present

## 2023-03-17 DIAGNOSIS — R600 Localized edema: Secondary | ICD-10-CM

## 2023-03-17 NOTE — Therapy (Signed)
OUTPATIENT PHYSICAL THERAPY LOWER EXTREMITY  TREATMENT NOTE   Patient Name: Craig House MRN: [id number] DOB:04-06-1961, 62 y.o., male Today's Date: 03/17/2023  END OF SESSION:  PT End of Session - 03/17/23 1455     Visit Number 19    Number of Visits 21    Date for PT Re-Evaluation 03/21/23    Authorization Type [employer]    Authorization - Number of Visits 90    PT Start Time 1430    PT Stop Time 1510    PT Time Calculation (min) 40 min    Activity Tolerance Patient tolerated treatment well    Behavior During Therapy [facility] for tasks assessed/performed                            Past Medical History:  Diagnosis Date   Allergy    spring   Anemia    Asthma    Diabetes mellitus without complication (HCC)    type II   Hyperplastic colon polyp    Hypertension    Obesity    Past Surgical History:  Procedure Laterality Date   KNEE ARTHROSCOPY Bilateral    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   Procedure: LEFT TOTAL KNEE ARTHROPLASTY;  Surgeon: Tarry Kos, MD;  Location: [facility] OR;  Service: Orthopedics;  Laterality: Left;   Patient Active Problem List   Diagnosis Date Noted   Primary osteoarthritis of left knee 01/09/2023   Status post total left knee replacement 01/09/2023   Bilateral primary osteoarthritis of knee 06/19/2018   Mild intermittent asthma, uncomplicated 01/18/2016   HTN (hypertension) 12/31/2011    PCP: Marisue Brooklyn   REFERRING PROVIDER: Tarry Kos, MD  REFERRING DIAG: [phone] (ICD-10-CM) - Primary osteoarthritis of left knee  THERAPY DIAG:  Stiffness of left knee, not elsewhere classified  Difficulty in walking, not elsewhere classified  Acute pain of left knee  Localized edema  Rationale for Evaluation and Treatment: Rehabilitation  ONSET DATE: 01/05/2023  SUBJECTIVE:   SUBJECTIVE STATEMENT:  I exercised a lot all day trying to get knee loose, did the bike, stretched, worked on bending it. Nothing else  new going on.   PERTINENT HISTORY: Hospital Course: [name] is an 62 y.o. male who was admitted 01/09/2023 for operative treatment ofPrimary osteoarthritis of left knee. Patient has severe unremitting pain that affects sleep, daily activities, and work/hobbies. After pre-op clearance the patient was taken to the operating room on 01/09/2023 and underwent  Procedure(s): LEFT TOTAL KNEE ARTHROPLASTY.   PAIN:  Are you having pain? Yes: NPRS scale: 2/10 Pain location: L knee  Pain description: stiffness/tightness especially medial knee Aggravating factors: stretching and bending  Relieving factors: ice   PRECAUTIONS: Fall  WEIGHT BEARING RESTRICTIONS: No  FALLS:  Has patient fallen in last 6 months? 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   PLOF: Independent, Independent with basic ADLs, Independent with gait, and Independent with transfers  PATIENT GOALS: get movement and mobility back   NEXT MD VISIT: Referring in 4 weeks   OBJECTIVE:   DIAGNOSTIC FINDINGS:   PATIENT SURVEYS:  FOTO 13  COGNITION: Overall cognitive  status: Within functional limits for tasks assessed     SENSATION: Not tested  EDEMA:   Appropriate for post-op state   MUSCLE LENGTH:    POSTURE:   PALPATION:   LOWER EXTREMITY ROM:  Active ROM = A Passive ROM = P Right eval Left eval Left 01/30/23 Left 02/03/23 Left 02/06/23 Left 02/14/23 Left 02/17/23 Left 02/20/23 Left 02/24/23 Left 03/01/23 Left 03/10/23 Left 03/17/23  Hip flexion              Hip extension              Hip abduction              Hip adduction              Hip internal rotation              Hip external rotation               Knee flexion  73* A: 80 P: 85 Active 92 A: 90 P: 95 AAROM: 90*, A: 80* AAROM 90*, AROM 80* AROM 94 PROM 99 88* AROM, AAROM 99* 93* AROM, AAROM 99* 94* AROM, AAROM 102*  101* AROM, 103* AAROM  Knee extension  15* A: 12 P: 10 -12 A: 12 P: 10 A 5* A supine 12*   A seated at edge of mat table 10* AROM with LAQ 4-5*  AROM with LAQ  7*  Ankle dorsiflexion              Ankle plantarflexion              Ankle inversion              Ankle eversion               (Blank rows = not tested)  LOWER EXTREMITY MMT:  MMT Right eval Left eval R  02/17/23 L 02/17/23 R 03/17/23 L 03/17/23  Hip flexion 4+ 3+ pain limited  4+ 4+ 5 5  Hip extension     5 5  Hip abduction     5 4+  Hip adduction        Hip internal rotation        Hip external rotation        Knee flexion 4- 3+ 4+ 4- 5 4+  Knee extension 5 4- 5 4 5 5   Ankle dorsiflexion 5 5      Ankle plantarflexion        Ankle inversion        Ankle eversion         (Blank rows = not tested)  LOWER EXTREMITY SPECIAL TESTS:    FUNCTIONAL TESTS:  Eval:  Timed up and go (TUG): 23 seconds RW; 03/17/23- 10.4 seconds no device    3 minute walk test: 346ft with RW; 03/17/23- 663ft no device      02/17/23- TUG 16.5 seconds, hurry-cane; 434ft hurry-cane  GAIT: Distance walked: 361ft  Assistive device utilized: Environmental consultant - 2 wheeled Level of assistance: Modified independence Comments: flexed at hips, antalgic with less wt shift to left, limited ROM in L knee in stance and swing phases    TODAY'S TREATMENT:  DATE:   03/17/23  FOTO 57  Objective measures/appropriate education on goals/POC moving forward  TherEX  Precor bike seat x8 minutes (4 minutes on seat 4, then 4 minutes on seat 3); ongoing cues to reduce hip compensations and ensure pure knee flexion  Selfcare  Lots of education on progress towards goals/POC  moving forward- knee ROM is last remaining goal but he is working hard and it will likely come along with time as long as he continues to work hard, would still avoid having friends bend or try to straighten his knee to avoid injury      03/15/23  TherEx  Precor bike seat 4 --> seat 3 x8 minutes for ROM  Knee flexion stretches on chair 10x10 second holds HS stretches 3x30 seconds LLE  Hip flexor/quad stretch 4x30 seconds L LE Standing quad stretch on mat table 3x30 seconds Forward and lateral step ups 8 inch box x15 each L LE  End range flexion strengthening- butt kicks from foot resting on top of 8 inch box x15 with 2-3 second holds Forward step downs from 8 inch step x15 U UE support Forward lunges 8 inch box x15 U UE support  Knee flexion AAROM with PT assist x10    03/10/23  TherEX  PreCor bike seat 4 x8 minutes full rotations  Knee flexion stretch on 8 inch box 10x10 second holds HS stretches 8 inch box standing 3x30 seconds L LE  Forward step ups 8 inch box x12 Lateral step ups 8 inch box x12 ROM check for flexion/extension  Hamstring curls green TB x15 with 3 seconds      03/07/23  TherEx  Precor bike seat 5 full rotations x8 minutes  Knee flexion stretch on 8 inch box 10x10 second holds TRX squats with 5 sec hold at max knee flexion 5 sec 10 reps, 5 reps TRX lunges X 5 bilat with 5 sec hold HS stretches standing 8 inch box 3x30 seconds Knee extension stretch propped up on chair x4 minutes with 4#  Forward step ups with left leg and downs with Rt leg on 6 inch box x15 with one UE support Manual Seated knee flexion PROM with overpressure to tolerance 03/03/23   TherEx   Precor bike seat 5 full rotations x6 minutes cues to avoid hip compensations  Knee flexion stretch on 8 inch box 10x10 second holds- 102* today HS stretches standing 8 inch box 3x30 seconds TKEs into blue TB standing x15 with 3 second holds  Knee extension stretch propped up on chair x4  minutes with 4#  Forward step downs 6 inch box x15 with 5 second holds  HS curls with tap on 6 inch box x15   03/01/23  TherEx  Precor seat 5 full rotation x6 minutes cues to avoid hip compensations Knee flexion stretch on platform 10x10 second holds Shuttle LE press 75#  B up, L down eccentric lower + 5 second holds for flexion- to 98*  Forward step ups 6 inch box x15 L LE Lateral step ups 6 inch box x15 L LE  Forward step downs 6 inch box x10 L LE with 3 second holds for flexion (able to flex knee to 100* during exercise) HS stretches standing 4x30 seconds     02/24/23  TherEx  Scifit bike seat 6 progressing to seat 5 full rotations Knee flexion stretch 10x10 second holds 8 inch box Forward step downs 6 inch box BUE support on // bars L LE only x15  Squats with  sink into knee flexion stretch x10 with 5 second holds Quad stretch with bent knee on table 3x30 seconds  Shuttle DL press 16# up, L LE lower down with 5 second holds into flexion stretch x12- to 98* flexion   Manual   Knee AAROM for flexion on high mat table with overpressure from PT     PATIENT EDUCATION:  Education details: POC, HEP, exam findings, edema management, general progression through PT after TKR, elevation strategies at home Person educated: Patient and significant other  Education method: Explanation, Demonstration, and Handouts Education comprehension: verbalized understanding, returned demonstration, and needs further education  HOME EXERCISE PROGRAM:  Access Code: [email or web address] URL: [email or web address] Date: 03/03/2023 Prepared by: Nedra Hai  Exercises - Seated Long Arc Quad  - 4-5 x daily - 7 x weekly - 2 sets - 10 reps - 3-5 sec hold - Seated Small Alternating Straight Leg Lifts with Heel Touch  - 4-5 x daily - 7 x weekly - 2 sets - 10 reps - Supine Heel Slide with Strap  - 4-5 x daily - 7 x weekly - 10 reps - 5 seconds hold - Sit to Stand with Counter Support  - 4-5 x  daily - 7 x weekly - 2 sets - 10 reps - Heel Raises with Counter Support  - 4-5 x daily - 7 x weekly - 2 sets - 10 reps - Supine Quadricep Sets  - 5 x daily - 7 x weekly - 2 sets - 10 reps - 5 second hold - Seated Passive Knee Extension  - 3 x daily - 7 x weekly - 1 sets - 1 reps - 3-5 minutes hold - Hooklying Hamstring Stretch with Strap  - 2-3 x daily - 7 x weekly - 1 sets - 3 reps - 30 hold - Forward Step Down with Heel Tap and Counter Support  - 2-3 x daily - 7 x weekly - 1 sets - 10 reps - Standing Terminal Knee Extension with Resistance  - 2-3 x daily - 7 x weekly - 1 sets - 10 reps - 5 hold  ASSESSMENT:  CLINICAL IMPRESSION:    [name] arrives doing well, we opted to recheck FOTO and all objective measures. He has met the majority of his goals with remaining goal being that he is still a few degrees short of flexion and extension AROM goals. We will plan to continue for 2 more skilled PT sessions with heavy focus on ROM and then will likely DC to advanced HEP at that point. I think he is very close to having had maximized benefit from skilled PT at this point, but has become very functional with the exception of some stubborn ongoing knee stiffness.   OBJECTIVE IMPAIRMENTS: Abnormal gait, decreased activity tolerance, decreased balance, decreased knowledge of use of DME, decreased mobility, difficulty walking, decreased ROM, decreased strength, hypomobility, increased edema, increased fascial restrictions, impaired flexibility, and pain.   ACTIVITY LIMITATIONS: standing, squatting, stairs, transfers, and locomotion level  PARTICIPATION LIMITATIONS: driving, shopping, community activity, occupation, and yard work  PERSONAL FACTORS: Age, Behavior pattern, Education, Fitness, Past/current experiences, and Time since onset of injury/illness/exacerbation are also affecting patient's functional outcome.   REHAB POTENTIAL: Good  CLINICAL DECISION MAKING: Stable/uncomplicated  EVALUATION  COMPLEXITY: Low   GOALS: Goals reviewed with patient? Yes  SHORT TERM GOALS: Target date: 02/21/2023   Will be compliant with progressive appropriate HEP  Baseline: Goal status: MET 02/06/23  2.  L knee extension AROM to be no more than  5* and flexion AROM to be at least 90* Baseline:  Goal status: PARTIALLY MET 03/17/23- extension ROM variable   3.  Will be able to ambulate in home distances with LRAD without significant gait deviation Baseline:  Goal status: MET 03/17/23  4.  Will demonstrate equal weight bearing BLEs with functional transfers and no offshift from L LE  Baseline:  Goal status: MET 02/17/23    LONG TERM GOALS: Target date: 03/21/2023    MMT to have improved by at least 1 grade in all weak groups  Baseline:  Goal status: MET 03/17/23  2.  L knee flexion AROM to be at least 105*  Baseline:  Goal status: IN PROGRESS 03/17/23  3.  Will be able to ambulate at least 464ft with LRAD in to show improved functional mobility  Baseline:  Goal status: MET 03/17/23  4.  Will be able to complete TUG in 13 seconds or less with LRAD in order to show improved functional mobility and balance  Baseline:  Goal status: MET 03/17/23  5.  Will be able to reciprocal ascend/descend 7 steps with no increase in pain and U rail to improve home and community access  Baseline:  Goal status: MET 03/17/23    PLAN:  PT FREQUENCY:  3x/week for first 4 weeks, then 2x/week for next 4 weeks   PT DURATION: 8 weeks  PLANNED INTERVENTIONS: Therapeutic exercises, Therapeutic activity, Neuromuscular re-education, Balance training, Gait training, Patient/Family education, Self Care, Joint mobilization, Stair training, DME instructions, Aquatic Therapy, Dry Needling, Electrical stimulation, Cryotherapy, Moist heat, Taping, Vasopneumatic device, Ultrasound, Ionotophoresis 4mg /ml Dexamethasone, Manual therapy, and Re-evaluation  PLAN FOR NEXT SESSION:  Heavy manual focus to try to get last few degrees  of extension and flexion to goal level. Finalized advanced HEP. DC in 2 sessions   Nedra Hai PT DPT PN2

## 2023-03-21 ENCOUNTER — Encounter: Payer: Self-pay | Admitting: Physical Therapy

## 2023-03-21 ENCOUNTER — Ambulatory Visit (INDEPENDENT_AMBULATORY_CARE_PROVIDER_SITE_OTHER): Payer: Managed Care, Other (non HMO) | Admitting: Physical Therapy

## 2023-03-21 DIAGNOSIS — M25562 Pain in left knee: Secondary | ICD-10-CM

## 2023-03-21 DIAGNOSIS — R600 Localized edema: Secondary | ICD-10-CM | POA: Diagnosis not present

## 2023-03-21 DIAGNOSIS — M25662 Stiffness of left knee, not elsewhere classified: Secondary | ICD-10-CM | POA: Diagnosis not present

## 2023-03-21 DIAGNOSIS — R262 Difficulty in walking, not elsewhere classified: Secondary | ICD-10-CM

## 2023-03-21 NOTE — Therapy (Signed)
OUTPATIENT PHYSICAL THERAPY LOWER EXTREMITY  TREATMENT NOTE   Patient Name: Craig House MRN: [id number] DOB:[birth year], 62 y.o., male Today's Date: 03/21/2023  END OF SESSION:  PT End of Session - 03/21/23 1342     Visit Number 20    Number of Visits 21    Date for PT Re-Evaluation 03/21/23    Authorization Type [employer]    Authorization - Number of Visits 90    PT Start Time 0142    PT Stop Time 0225    PT Time Calculation (min) 43 min    Activity Tolerance Patient tolerated treatment well    Behavior During Therapy [facility] for tasks assessed/performed                            Past Medical History:  Diagnosis Date   Allergy    spring   Anemia    Asthma    Diabetes mellitus without complication (HCC)    type II   Hyperplastic colon polyp    Hypertension    Obesity    Past Surgical History:  Procedure Laterality Date   KNEE ARTHROSCOPY Bilateral    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   Procedure: LEFT TOTAL KNEE ARTHROPLASTY;  Surgeon: Tarry Kos, MD;  Location: [facility] OR;  Service: Orthopedics;  Laterality: Left;   Patient Active Problem List   Diagnosis Date Noted   Primary osteoarthritis of left knee 01/09/2023   Status post total left knee replacement 01/09/2023   Bilateral primary osteoarthritis of knee 06/19/2018   Mild intermittent asthma, uncomplicated 01/18/2016   HTN (hypertension) 12/31/2011    PCP: Marisue Brooklyn   REFERRING PROVIDER: Tarry Kos, MD  REFERRING DIAG: [phone] (ICD-10-CM) - Primary osteoarthritis of left knee  THERAPY DIAG:  Stiffness of left knee, not elsewhere classified  Difficulty in walking, not elsewhere classified  Acute pain of left knee  Localized edema  Rationale for Evaluation and Treatment: Rehabilitation  ONSET DATE: 01/05/2023  SUBJECTIVE:   SUBJECTIVE STATEMENT: He relays knee feels pretty good today, he did do some exercises to try to loosen up the knee before arriving  today  PERTINENT HISTORY: Hospital Course: Rodney Shamburg is an 62 y.o. male who was admitted 01/09/2023 for operative treatment ofPrimary osteoarthritis of left knee. Patient has severe unremitting pain that affects sleep, daily activities, and work/hobbies. After pre-op clearance the patient was taken to the operating room on 01/09/2023 and underwent  Procedure(s): LEFT TOTAL KNEE ARTHROPLASTY.   PAIN:  Are you having pain? Yes: NPRS scale: 2/10 Pain location: L knee  Pain description: stiffness/tightness especially medial knee Aggravating factors: stretching and bending  Relieving factors: ice   PRECAUTIONS: Fall  WEIGHT BEARING RESTRICTIONS: No  FALLS:  Has patient fallen in last 6 months? 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   PLOF: Independent, Independent with basic ADLs, Independent with gait, and Independent with transfers  PATIENT GOALS: get movement and mobility back   NEXT MD VISIT: Referring in 4 weeks   OBJECTIVE:   DIAGNOSTIC FINDINGS:   PATIENT SURVEYS:  FOTO 47  COGNITION: Overall cognitive status: Within functional limits  for tasks assessed     SENSATION: Not tested  EDEMA:   Appropriate for post-op state   MUSCLE LENGTH:    POSTURE:   PALPATION:   LOWER EXTREMITY ROM:  Active ROM = A Passive ROM = P Right eval Left eval Left 01/30/23 Left 02/03/23 Left 02/06/23 Left 02/14/23 Left 02/17/23 Left 02/20/23 Left 02/24/23 Left 03/01/23 Left 03/10/23 Left 03/17/23  Hip flexion              Hip extension              Hip abduction              Hip adduction              Hip internal rotation              Hip external rotation               Knee flexion  73* A: 80 P: 85 Active 92 A: 90 P: 95 AAROM: 90*, A: 80* AAROM 90*, AROM 80* AROM 94 PROM 99 88* AROM, AAROM 99* 93* AROM, AAROM 99* 94* AROM, AAROM 102*  101* AROM, 103* AAROM  Knee extension  15* A: 12 P: 10 -12 A: 12 P: 10 A 5* A supine 12*   A seated at edge of mat table 10* AROM with LAQ 4-5*  AROM with LAQ  7*  Ankle dorsiflexion              Ankle plantarflexion              Ankle inversion              Ankle eversion               (Blank rows = not tested)  LOWER EXTREMITY MMT:  MMT Right eval Left eval R  02/17/23 L 02/17/23 R 03/17/23 L 03/17/23  Hip flexion 4+ 3+ pain limited  4+ 4+ 5 5  Hip extension     5 5  Hip abduction     5 4+  Hip adduction        Hip internal rotation        Hip external rotation        Knee flexion 4- 3+ 4+ 4- 5 4+  Knee extension 5 4- 5 4 5 5   Ankle dorsiflexion 5 5      Ankle plantarflexion        Ankle inversion        Ankle eversion         (Blank rows = not tested)  LOWER EXTREMITY SPECIAL TESTS:    FUNCTIONAL TESTS:  Eval:  Timed up and go (TUG): 23 seconds RW; 03/17/23- 10.4 seconds no device    3 minute walk test: 341ft with RW; 03/17/23- 630ft no device      02/17/23- TUG 16.5 seconds, hurry-cane; 410ft hurry-cane  GAIT: Distance walked: 381ft  Assistive device utilized: Environmental consultant - 2 wheeled Level of assistance: Modified independence Comments: flexed at hips, antalgic with less wt shift to left, limited ROM in L knee in stance and swing phases    TODAY'S TREATMENT:  DATE:  03/21/23  TherEx  Precor bike seat 4 x8 minutes for ROM  Knee flexion stretches on chair 10x5 second holds Step ups onto 6 inch step leading up with left leg and down in front with Rt leg X 10 reps Leg press left leg only 75# 2X10, then DL 161# [id number] HS stretches 3x30 seconds LLE  Prone quad stretch 30 sec X  3 Seated Knee flexion AAROM 5 sec X 10  Supine hamstring stretch 3X30 sec Supine heelslides 5 sec  X10  Manual therapy for left knee PROM with overpressure to tolerance  03/17/23  FOTO 57  Objective measures/appropriate education on goals/POC moving forward  TherEX  Precor bike seat x8 minutes (4 minutes on seat 4, then 4 minutes on seat 3); ongoing cues to reduce hip compensations and ensure pure knee flexion  Selfcare  Lots of education on progress towards goals/POC moving forward- knee ROM is last remaining goal but he is working hard and it will likely come along with time as long as he continues to work hard, would still avoid having friends bend or try to straighten his knee to avoid injury     PATIENT EDUCATION:  Education details: POC, HEP, exam findings, edema management, general progression through PT after TKR, elevation strategies at home Person educated: Patient and significant other  Education method: Explanation, Demonstration, and Handouts Education comprehension: verbalized understanding, returned demonstration, and needs further education  HOME EXERCISE PROGRAM:  Access Code: [email or web address] URL: [email or web address] Date: 03/21/2023 Prepared by: Ivery Quale  Exercises - Hooklying Hamstring Stretch with Strap  - 2-3 x daily - 7 x weekly - 1 sets - 3 reps - 30 hold - Supine Heel Slide with Strap  - 4-5 x daily - 7 x weekly - 10 reps - 5 seconds hold - Prone Quad Stretch with Towel Roll and Strap  - 2 x daily - 6 x weekly - 1 sets - 3 reps - 30- hold - Seated Knee Flexion Stretch  - 2 x daily - 6 x weekly - 1-2 sets - 10 reps - 5 sec hold - standing knee flexion stretch on chair or step  - 2 x daily - 6 x weekly - 1 sets - 10 reps - 10 hold - Seated Small Alternating Straight Leg Lifts with Heel Touch  - 4-5 x daily - 7 x weekly - 2 sets - 10 reps - Seated Passive Knee Extension  - 3 x daily - 7 x weekly - 1 sets - 1 reps - 3-5 minutes  hold  ASSESSMENT:  CLINICAL IMPRESSION:  He is doing well just limited by knee stiffness still. I did revise his HEP to focus on stretching and ROM moving forward as she finishes up with PT next visit. He has met all other PT goals.   OBJECTIVE IMPAIRMENTS: Abnormal gait, decreased activity tolerance, decreased balance, decreased knowledge of use of DME, decreased mobility, difficulty walking, decreased ROM, decreased strength, hypomobility, increased edema, increased fascial restrictions, impaired flexibility, and pain.   ACTIVITY LIMITATIONS: standing, squatting, stairs, transfers, and locomotion level  PARTICIPATION LIMITATIONS: driving, shopping, community activity, occupation, and yard work  PERSONAL FACTORS: Age, Behavior pattern, Education, Fitness, Past/current experiences, and Time since onset of injury/illness/exacerbation are also affecting patient's functional outcome.   REHAB POTENTIAL: Good  CLINICAL DECISION MAKING: Stable/uncomplicated  EVALUATION COMPLEXITY: Low   GOALS: Goals reviewed with patient? Yes  SHORT TERM GOALS: Target date: 02/21/2023   Will be compliant with progressive appropriate HEP  Baseline:  Goal status: MET 02/06/23  2.  L knee extension AROM to be no more than 5* and flexion AROM to be at least 90* Baseline:  Goal status: PARTIALLY MET 03/17/23- extension ROM variable   3.  Will be able to ambulate in home distances with LRAD without significant gait deviation Baseline:  Goal status: MET 03/17/23  4.  Will demonstrate equal weight bearing BLEs with functional transfers and no offshift from L LE  Baseline:  Goal status: MET 02/17/23    LONG TERM GOALS: Target date: 03/21/2023    MMT to have improved by at least 1 grade in all weak groups  Baseline:  Goal status: MET 03/17/23  2.  L knee flexion AROM to be at least 105*  Baseline:  Goal status: IN PROGRESS 03/17/23  3.  Will be able to ambulate at least 479ft with LRAD in to show  improved functional mobility  Baseline:  Goal status: MET 03/17/23  4.  Will be able to complete TUG in 13 seconds or less with LRAD in order to show improved functional mobility and balance  Baseline:  Goal status: MET 03/17/23  5.  Will be able to reciprocal ascend/descend 7 steps with no increase in pain and U rail to improve home and community access  Baseline:  Goal status: MET 03/17/23    PLAN:  PT FREQUENCY:  3x/week for first 4 weeks, then 2x/week for next 4 weeks   PT DURATION: 8 weeks  PLANNED INTERVENTIONS: Therapeutic exercises, Therapeutic activity, Neuromuscular re-education, Balance training, Gait training, Patient/Family education, Self Care, Joint mobilization, Stair training, DME instructions, Aquatic Therapy, Dry Needling, Electrical stimulation, Cryotherapy, Moist heat, Taping, Vasopneumatic device, Ultrasound, Ionotophoresis 4mg /ml Dexamethasone, Manual therapy, and Re-evaluation  PLAN FOR NEXT SESSION: DC next visit and review final HEP review.  Ivery Quale, PT, DPT 03/21/23 2:14 PM

## 2023-03-23 ENCOUNTER — Ambulatory Visit: Payer: Managed Care, Other (non HMO) | Admitting: Physical Therapy

## 2023-03-23 ENCOUNTER — Encounter: Payer: Self-pay | Admitting: Physical Therapy

## 2023-03-23 DIAGNOSIS — M25662 Stiffness of left knee, not elsewhere classified: Secondary | ICD-10-CM

## 2023-03-23 DIAGNOSIS — M25562 Pain in left knee: Secondary | ICD-10-CM | POA: Diagnosis not present

## 2023-03-23 DIAGNOSIS — R262 Difficulty in walking, not elsewhere classified: Secondary | ICD-10-CM | POA: Diagnosis not present

## 2023-03-23 DIAGNOSIS — R600 Localized edema: Secondary | ICD-10-CM

## 2023-03-23 NOTE — Therapy (Signed)
OUTPATIENT PHYSICAL THERAPY LOWER EXTREMITY  TREATMENT and Discharge PHYSICAL THERAPY DISCHARGE SUMMARY  Visits from Start of Care: 21  Current functional level related to goals / functional outcomes: See below   Remaining deficits: See below   Education / Equipment: HEP  Plan:  Patient goals were met. Patient is being discharged due to completing the PT treatment cycle and he is at a good functional level.       Patient Name: Craig House MRN: [id number] DOB:Feb 05, 1961, 63 y.o., male Today's Date: 03/23/2023  END OF SESSION:  PT End of Session - 03/23/23 1437     Visit Number 21    Number of Visits 21    Date for PT Re-Evaluation 03/21/23    Authorization Type [employer]    Authorization - Number of Visits 90    PT Start Time 1428    PT Stop Time 1506    PT Time Calculation (min) 38 min    Activity Tolerance Patient tolerated treatment well    Behavior During Therapy [facility] for tasks assessed/performed                            Past Medical History:  Diagnosis Date   Allergy    spring   Anemia    Asthma    Diabetes mellitus without complication (HCC)    type II   Hyperplastic colon polyp    Hypertension    Obesity    Past Surgical History:  Procedure Laterality Date   KNEE ARTHROSCOPY Bilateral    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   Procedure: LEFT TOTAL KNEE ARTHROPLASTY;  Surgeon: Tarry Kos, MD;  Location: [facility] OR;  Service: Orthopedics;  Laterality: Left;   Patient Active Problem List   Diagnosis Date Noted   Primary osteoarthritis of left knee 01/09/2023   Status post total left knee replacement 01/09/2023   Bilateral primary osteoarthritis of knee 06/19/2018   Mild intermittent asthma, uncomplicated 01/18/2016   HTN (hypertension) 12/31/2011    PCP: Marisue Brooklyn   REFERRING PROVIDER: Tarry Kos, MD  REFERRING DIAG: [phone] (ICD-10-CM) - Primary osteoarthritis of left knee  THERAPY DIAG:  Stiffness of  left knee, not elsewhere classified  Difficulty in walking, not elsewhere classified  Acute pain of left knee  Localized edema  Rationale for Evaluation and Treatment: Rehabilitation  ONSET DATE: 01/05/2023  SUBJECTIVE:   SUBJECTIVE STATEMENT: He relays knee feels pretty good, he feels ready to discharge   PERTINENT HISTORY: Hospital Course: Craig House is an 62 y.o. male who was admitted 01/09/2023 for operative treatment ofPrimary osteoarthritis of left knee. Patient has severe unremitting pain that affects sleep, daily activities, and work/hobbies. After pre-op clearance the patient was taken to the operating room on 01/09/2023 and underwent  Procedure(s): LEFT TOTAL KNEE ARTHROPLASTY.   PAIN:  Are you having pain? Yes: NPRS scale: 1/10 Pain location: L knee  Pain description: stiffness/tightness especially medial knee Aggravating factors: stretching and bending  Relieving factors: ice   PRECAUTIONS: Fall  WEIGHT BEARING RESTRICTIONS: No  FALLS:  Has patient fallen in last 6 months? 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 but has a lot of  movement/moving around   PLOF: Independent, Independent with basic ADLs, Independent with gait, and Independent with transfers  PATIENT GOALS: get movement and mobility back   NEXT MD VISIT: Referring in 4 weeks   OBJECTIVE:   DIAGNOSTIC FINDINGS:   PATIENT SURVEYS:  FOTO 46  COGNITION: Overall cognitive status: Within functional limits for tasks assessed     SENSATION: Not tested  EDEMA:   Appropriate for post-op state   MUSCLE LENGTH:    POSTURE:   PALPATION:   LOWER EXTREMITY ROM:  Active ROM = A Passive ROM = P Right eval  Left eval Left 01/30/23 Left 02/03/23 Left 02/06/23 Left 02/14/23 Left 02/17/23 Left 02/20/23 Left 02/24/23 Left 03/01/23 Left 03/10/23 Left 03/17/23 Left 03/23/23  Hip flexion               Hip extension               Hip abduction               Hip adduction               Hip internal rotation               Hip external rotation               Knee flexion  73* A: 80 P: 85 Active 92 A: 90 P: 95 AAROM: 90*, A: 80* AAROM 90*, AROM 80* AROM 94 PROM 99 88* AROM, AAROM 99* 93* AROM, AAROM 99* 94* AROM, AAROM 102*  101* AROM, 103* AAROM 104 AROM 107 PROM  Knee extension  15* A: 12 P: 10 -12 A: 12 P: 10 A 5* A supine 12*   A seated at edge of mat table 10* AROM with LAQ 4-5*  AROM with LAQ  7* 5 AROM 3PROM  Ankle dorsiflexion               Ankle plantarflexion               Ankle inversion               Ankle eversion                (Blank rows = not tested)  LOWER EXTREMITY MMT:  MMT Right eval Left eval R  02/17/23 L 02/17/23 R 03/17/23 L 03/17/23  Hip flexion 4+ 3+ pain limited  4+ 4+ 5 5  Hip extension     5 5  Hip abduction     5 4+  Hip adduction        Hip internal rotation        Hip external rotation        Knee flexion 4- 3+ 4+ 4- 5 4+  Knee extension 5 4- 5 4 5 5   Ankle dorsiflexion 5 5      Ankle plantarflexion        Ankle inversion        Ankle eversion         (Blank rows = not tested)  LOWER EXTREMITY SPECIAL TESTS:    FUNCTIONAL TESTS:  Eval:  Timed up and go (TUG): 23 seconds RW; 03/17/23- 10.4 seconds no device    3 minute walk test: 357ft with RW; 03/17/23- 674ft no device      02/17/23- TUG 16.5 seconds, hurry-cane; 47ft hurry-cane  GAIT: Distance walked: 348ft  Assistive device utilized: Walker - 2 wheeled Level of assistance: Modified independence Comments: flexed at hips, antalgic  with less wt shift to left, limited ROM in L knee in stance and swing phases    TODAY'S TREATMENT:                                                                                                                               DATE:  03/23/23  TherEx  Precor bike seat 4 x10 minutes for ROM  Knee flexion stretches on chair 10x10 second holds Step ups onto 6 inch step leading up with left leg and down in front with Rt leg X 10 reps Leg press left leg only 75# 2X10, then DL 161# 0R60 HS stretches 3x30 seconds LLE  Prone quad stretch 30 sec X 4 Seated Knee flexion AAROM 5 sec X 10  Supine hamstring stretch 3X30 sec Supine heelslides 5 sec  X10  Manual therapy for left knee PROM with overpressure to tolerance   PATIENT EDUCATION:  Education details: POC, HEP, exam findings, edema management, general progression through PT after TKR, elevation strategies at home Person educated: Patient and significant other  Education method: Explanation, Demonstration, and Handouts Education comprehension: verbalized understanding, returned demonstration, and needs further education  HOME EXERCISE PROGRAM:  Access Code: [email or web address] URL: [email or web address] Date: 03/21/2023 Prepared by: Ivery Quale  Exercises - Hooklying Hamstring Stretch with Strap  - 2-3 x daily - 7 x weekly - 1 sets - 3 reps - 30 hold - Supine Heel Slide with Strap  - 4-5 x daily - 7 x weekly - 10 reps - 5 seconds hold - Prone Quad Stretch with Towel Roll and Strap  - 2 x daily - 6 x weekly - 1 sets - 3 reps - 30- hold - Seated Knee Flexion Stretch  - 2 x daily - 6 x weekly - 1-2 sets - 10 reps - 5 sec hold - standing knee flexion stretch on chair or step  - 2 x daily - 6 x weekly - 1 sets - 10 reps - 10 hold - Seated Small Alternating Straight Leg Lifts with Heel Touch  - 4-5 x daily - 7 x weekly - 2 sets - 10 reps - Seated Passive Knee Extension  - 3 x daily - 7 x weekly - 1 sets - 1 reps - 3-5 minutes hold  ASSESSMENT:  CLINICAL IMPRESSION:  He has met all PT goals.He does have mild knee stiffness still. This should continue to improve some with dedicated stretching at home and continued  HEP. He will be discharged today to independent program.  OBJECTIVE IMPAIRMENTS: Abnormal gait, decreased activity tolerance, decreased balance, decreased knowledge of use of DME, decreased mobility, difficulty walking, decreased ROM, decreased strength, hypomobility, increased edema, increased fascial restrictions, impaired flexibility, and pain.   ACTIVITY LIMITATIONS: standing, squatting, stairs, transfers, and locomotion level  PARTICIPATION LIMITATIONS: driving, shopping, community activity, occupation, and yard work  PERSONAL FACTORS: Age, Behavior pattern, Education, Fitness, Past/current experiences, and Time since onset of injury/illness/exacerbation are also affecting patient's  functional outcome.   REHAB POTENTIAL: Good  CLINICAL DECISION MAKING: Stable/uncomplicated  EVALUATION COMPLEXITY: Low   GOALS: Goals reviewed with patient? Yes  SHORT TERM GOALS: Target date: 02/21/2023   Will be compliant with progressive appropriate HEP  Baseline: Goal status: MET 02/06/23  2.  L knee extension AROM to be no more than 5* and flexion AROM to be at least 90* Baseline:  Goal status: PARTIALLY MET 03/17/23- extension ROM variable   3.  Will be able to ambulate in home distances with LRAD without significant gait deviation Baseline:  Goal status: MET 03/17/23  4.  Will demonstrate equal weight bearing BLEs with functional transfers and no offshift from L LE  Baseline:  Goal status: MET 02/17/23    LONG TERM GOALS: Target date: 03/21/2023    MMT to have improved by at least 1 grade in all weak groups  Baseline:  Goal status: MET 03/17/23  2.  L knee flexion AROM to be at least 105*  Baseline:  Goal status:MET 03/23/23  3.  Will be able to ambulate at least 47ft with LRAD in to show improved functional mobility  Baseline:  Goal status: MET 03/17/23  4.  Will be able to complete TUG in 13 seconds or less with LRAD in order to show improved functional mobility and balance   Baseline:  Goal status: MET 03/17/23  5.  Will be able to reciprocal ascend/descend 7 steps with no increase in pain and U rail to improve home and community access  Baseline:  Goal status: MET 03/17/23    PLAN:  PT FREQUENCY:  3x/week for first 4 weeks, then 2x/week for next 4 weeks   PT DURATION: 8 weeks  PLANNED INTERVENTIONS: Therapeutic exercises, Therapeutic activity, Neuromuscular re-education, Balance training, Gait training, Patient/Family education, Self Care, Joint mobilization, Stair training, DME instructions, Aquatic Therapy, Dry Needling, Electrical stimulation, Cryotherapy, Moist heat, Taping, Vasopneumatic device, Ultrasound, Ionotophoresis 4mg /ml Dexamethasone, Manual therapy, and Re-evaluation  PLAN FOR NEXT SESSION: DC today Ivery Quale, PT, DPT 03/23/23 2:40 PM

## 2023-03-27 NOTE — Progress Notes (Unsigned)
   Post-Op Visit Note   Patient: Craig House           Date of Birth: 08/12/1961           MRN: [id number] Visit Date: 03/28/2023 PCP: Esperanza Richters, PA-C   Assessment & Plan:  Chief Complaint: No chief complaint on file.  Visit Diagnoses:  1. Status post total left knee replacement     Plan: ***  Follow-Up Instructions: No follow-ups on file.   Orders:  No orders of the defined types were placed in this encounter.  No orders of the defined types were placed in this encounter.   Imaging: No results found.  PMFS History: Patient Active Problem List   Diagnosis Date Noted   Primary osteoarthritis of left knee 01/09/2023   Status post total left knee replacement 01/09/2023   Bilateral primary osteoarthritis of knee 06/19/2018   Mild intermittent asthma, uncomplicated 01/18/2016   HTN (hypertension) 12/31/2011   Past Medical History:  Diagnosis Date   Allergy    spring   Anemia    Asthma    Diabetes mellitus without complication (HCC)    type II   Hyperplastic colon polyp    Hypertension    Obesity     Family History  Problem Relation Age of Onset   Heart attack Mother    Cancer Father    Heart disease Father    Colon cancer Neg Hx     Past Surgical History:  Procedure Laterality Date   KNEE ARTHROSCOPY Bilateral    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   Procedure: LEFT TOTAL KNEE ARTHROPLASTY;  Surgeon: Tarry Kos, MD;  Location: [facility] OR;  Service: Orthopedics;  Laterality: Left;   Social History   Occupational History   Not on file  Tobacco Use   Smoking status: Former   Smokeless tobacco: Never  Vaping Use   Vaping Use: Never used  Substance and Sexual Activity   Alcohol use: Yes    Alcohol/week: 1.0 standard drink of alcohol    Types: 1 Standard drinks or equivalent per week    Comment: 1 glass of red wine at night   Drug use: No   Sexual activity: Yes

## 2023-03-28 ENCOUNTER — Ambulatory Visit (INDEPENDENT_AMBULATORY_CARE_PROVIDER_SITE_OTHER): Payer: Managed Care, Other (non HMO) | Admitting: Orthopaedic Surgery

## 2023-03-28 DIAGNOSIS — Z96652 Presence of left artificial knee joint: Secondary | ICD-10-CM

## 2023-03-31 ENCOUNTER — Telehealth: Payer: Self-pay | Admitting: Orthopaedic Surgery

## 2023-03-31 NOTE — Telephone Encounter (Signed)
[employer] notify [employer] and advise patients return to work date is supposed to be May 27th, please call patient

## 2023-04-03 NOTE — Telephone Encounter (Signed)
IC,lmvm advised note is ready. Advised if he wants note to be faxed to call us with a fax number, otherwise the note is at the front desk to be picked up.

## 2023-04-04 ENCOUNTER — Telehealth: Payer: Self-pay | Admitting: Orthopaedic Surgery

## 2023-04-04 NOTE — Telephone Encounter (Signed)
He is released back to work.  No restrictions from my standpoint unless he feels like he needs specific ones.

## 2023-04-04 NOTE — Telephone Encounter (Signed)
Patient states he need his return to work date and any restrictions sent to employer. Please advise email--[email or web address]. also leave a hard copy at front desk. Please advise

## 2023-04-21 ENCOUNTER — Other Ambulatory Visit (HOSPITAL_BASED_OUTPATIENT_CLINIC_OR_DEPARTMENT_OTHER): Payer: Self-pay

## 2023-04-21 ENCOUNTER — Ambulatory Visit (INDEPENDENT_AMBULATORY_CARE_PROVIDER_SITE_OTHER): Payer: Managed Care, Other (non HMO) | Admitting: Medical

## 2023-04-21 VITALS — BP 132/86 | HR 84 | Resp 18 | Ht 67.0 in | Wt 172.6 lb

## 2023-04-21 DIAGNOSIS — T7840XA Allergy, unspecified, initial encounter: Secondary | ICD-10-CM

## 2023-04-21 DIAGNOSIS — R634 Abnormal weight loss: Secondary | ICD-10-CM | POA: Diagnosis not present

## 2023-04-21 DIAGNOSIS — I10 Essential (primary) hypertension: Secondary | ICD-10-CM

## 2023-04-21 DIAGNOSIS — E119 Type 2 diabetes mellitus without complications: Secondary | ICD-10-CM

## 2023-04-21 DIAGNOSIS — Z7984 Long term (current) use of oral hypoglycemic drugs: Secondary | ICD-10-CM

## 2023-04-21 LAB — COMPREHENSIVE METABOLIC PANEL
ALT: 19 U/L (ref 0–53)
AST: 16 U/L (ref 0–37)
Albumin: 4.4 g/dL (ref 3.5–5.2)
Alkaline Phosphatase: 53 U/L (ref 39–117)
BUN: 16 mg/dL (ref 6–23)
CO2: 29 mEq/L (ref 19–32)
Calcium: 9.8 mg/dL (ref 8.4–10.5)
Chloride: 100 mEq/L (ref 96–112)
Creatinine, Ser: 1.33 mg/dL (ref 0.40–1.50)
GFR: 56.91 mL/min — ABNORMAL LOW (ref >60.00)
Glucose, Bld: 86 mg/dL (ref 70–99)
Potassium: 4.2 mEq/L (ref 3.5–5.1)
Sodium: 137 mEq/L (ref 135–145)
Total Bilirubin: 0.7 mg/dL (ref 0.2–1.2)
Total Protein: 7.8 g/dL (ref 6.0–8.3)

## 2023-04-21 LAB — HEMOGLOBIN A1C: Hgb A1c MFr Bld: 4.9 % (ref 4.6–6.5)

## 2023-04-21 MED ORDER — METHYLPREDNISOLONE 4 MG PO TBPK
ORAL_TABLET | ORAL | 0 refills | Status: DC
  Filled 2023-04-21: qty 21, fill #0
  Filled 2023-07-06: qty 21, 6d supply, fill #0

## 2023-04-21 MED ORDER — METHYLPREDNISOLONE 4 MG PO TBPK
ORAL_TABLET | ORAL | 0 refills | Status: AC
  Filled 2023-04-21: qty 21, 6d supply, fill #0

## 2023-04-21 NOTE — Patient Instructions (Signed)
1. Allergic reaction, initial encounter With scattered distribution of itchy rash on arms, legs and right area, I do think probable allergic reaction.  Prescribing 6-day taper Medrol.  Counseled extensively to watch the right eye area.  If you were to get any of the vesicle/small blister eruption around the right I will go ahead and prescribe antiviral.  Patient expresses understanding.  2. Controlled type 2 diabetes mellitus without complication, without long-term current use of insulin (HCC) Currently on metformin 500 mg twice daily.  Also you know that you have currents Ozempic and next week will be due for additional injection.  Want to evaluate your 2-month blood sugar average before refilling medication.  If sugars are tightly controlled might recommend just continuing Ozempic and discontinuing metformin.  Decision to be determined after lab review.  I will also note we did discuss while you are on Medrol to check your blood sugars daily.  If sugars are over 200 let me know. - Hemoglobin A1c - Comp Met (CMET)  3. Hypertension, unspecified type Continue current BP medications.  4. Weight loss You had expressed some concern about your weight loss.  To me it seems normal in light of the fact that during a portion of the last 3 months you were on Ozempic and you describe decrease in appetite postsurgery related to antibiotic use and narcotic use.  Glad to hear that you did gain weight when you tried over the last month or so.  Advising to go ahead and schedule wellness exam in 1 week.  Might do additional studies in addition to standard wellness exams.  Follow-up in 7 days or sooner if needed.

## 2023-04-21 NOTE — Progress Notes (Signed)
Subjective:    Patient ID: Craig House, male    DOB: June 17, 1961, 62 y.o.   MRN: [id number]  HPI  Pt in with recent rt eye upper lid rash and some itching for about 9 days. No vision changes reported. Some watery drainage initially but not now. No vision changes. No eye pain. No blister eruption around eye.  Rt upper eye lid were both itching and mild swollen at first since May 30 th. He states had watery dc at start. Then with in 2 days other  faint red rash areas started.   But also rash on both medial thighs, antebutical fossa region and left distal thigh. These are do itch as well. Pt has done some yard work but he reports working with gloves.   No recent antibiotics in last month.  Pt is diabetic. His last A1c was 5.4. pt is on meformin 500 mg bid. Pt used to be on ozempic but stopped on feb 14 per surgeon advise. Pt states after feb 26 he lost weight post knee surgery. He states lost appetite on pain meds after surgery dropping down to 160 lb. Now gained some wt back after eating better with purposeful effort to see if could get weight 172. Former weight up to 190. He had wanted to loose weight and we discussed with ozempic could control sugar better and may get secondary benefit of wt loss. He indicated he is happy with current weight.  Pt notes ozempic has been restarted and he gained wt even while  on ozempic  Sugar this morning was 140.   Htn- bp controlled on dyazide and amldipine.   In the past eye MD gave lotemax . Pt states when he used that in the pst cost him $200. No longer on presently. Presenlty did not refil   Pt tells me he was told by GI Md to repeat colonoscopy in 10 years. He had polyps and states he checked with GI MD on interval.       Review of Systems  Constitutional:  Negative for chills, fatigue and fever.  Eyes:  Positive for discharge. Negative for itching.       See hpi.  Respiratory:  Negative for cough, choking, chest tightness and  wheezing.   Cardiovascular:  Negative for chest pain and palpitations.  Gastrointestinal:  Negative for abdominal distention, abdominal pain and blood in stool.  Genitourinary:  Negative for dysuria, flank pain and frequency.  Musculoskeletal:  Negative for back pain.  Skin:  Negative for rash.  Neurological:  Negative for facial asymmetry and headaches.  Hematological:  Negative for adenopathy. Does not bruise/bleed easily.  Psychiatric/Behavioral:  Negative for behavioral problems, confusion, dysphoric mood and suicidal ideas. The patient is not nervous/anxious and is not hyperactive.    Past Medical History:  Diagnosis Date   Allergy    spring   Anemia    Asthma    Diabetes mellitus without complication (HCC)    type II   Hyperplastic colon polyp    Hypertension    Obesity      Social History   Socioeconomic History   Marital status: Single    Spouse name: Not on file   Number of children: Not on file   Years of education: Not on file   Highest education level: Associate degree: occupational, Scientist, product/process development, or vocational program  Occupational History   Not on file  Tobacco Use   Smoking status: Former   Smokeless tobacco: Never  Advertising account planner  Vaping Use: Never used  Substance and Sexual Activity   Alcohol use: Yes    Alcohol/week: 1.0 standard drink of alcohol    Types: 1 Standard drinks or equivalent per week    Comment: 1 glass of red wine at night   Drug use: No   Sexual activity: Yes  Other Topics Concern   Not on file  Social History Narrative   Not on file   Social Determinants of Health   Financial Resource Strain: Low Risk  (04/20/2023)   Overall Financial Resource Strain (CARDIA)    Difficulty of Paying Living Expenses: Not very hard  Food Insecurity: Food Insecurity Present (04/20/2023)   Hunger Vital Sign    Worried About Running Out of Food in the Last Year: Sometimes true    Ran Out of Food in the Last Year: Sometimes true  Transportation Needs: No  Transportation Needs (04/20/2023)   PRAPARE - Administrator, Civil Service (Medical): No    Lack of Transportation (Non-Medical): No  Physical Activity: Sufficiently Active (04/20/2023)   Exercise Vital Sign    Days of Exercise per Week: 4 days    Minutes of Exercise per Session: 40 min  Stress: No Stress Concern Present (04/20/2023)   [employer] of Occupational Health - Occupational Stress Questionnaire    Feeling of Stress : Only a little  Social Connections: Moderately Integrated (04/20/2023)   Social Connection and Isolation Panel [NHANES]    Frequency of Communication with Friends and Family: More than three times a week    Frequency of Social Gatherings with Friends and Family: Once a week    Attends Religious Services: More than 4 times per year    Active Member of Clubs or Organizations: Yes    Attends Banker Meetings: More than 4 times per year    Marital Status: Divorced  Intimate Partner Violence: Not At Risk (01/09/2023)   Humiliation, Afraid, Rape, and Kick questionnaire    Fear of Current or Ex-Partner: No    Emotionally Abused: No    Physically Abused: No    Sexually Abused: No    Past Surgical History:  Procedure Laterality Date   KNEE ARTHROSCOPY Bilateral    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   Procedure: LEFT TOTAL KNEE ARTHROPLASTY;  Surgeon: Tarry Kos, MD;  Location: [facility] OR;  Service: Orthopedics;  Laterality: Left;    Family History  Problem Relation Age of Onset   Heart attack Mother    Cancer Father    Heart disease Father    Colon cancer Neg Hx     No Known Allergies  Current Outpatient Medications on File Prior to Visit  Medication Sig Dispense Refill   albuterol (VENTOLIN HFA) 108 (90 Base) MCG/ACT inhaler INHALE 2 PUFFS BY MOUTH EVERY 6 HOURS AS NEEDED FOR WHEEZING OR SHORTNESS OF BREATH 8.5 g 0   amLODipine (NORVASC) 10 MG tablet 1 tab po q day 90 tablet 1   aspirin EC 81 MG tablet Take 1 tablet (81 mg total)  by mouth in the morning and at bedtime. To be taken after surgery to prevent blood clots 84 tablet 0   atorvastatin (LIPITOR) 20 MG tablet TAKE 1 TABLET BY MOUTH EVERY DAY 90 tablet 3   Brimonidine Tartrate (LUMIFY) 0.025 % SOLN Place 1 drop into both eyes daily as needed (redness).     buPROPion (WELLBUTRIN XL) 150 MG 24 hr tablet Take 1 tablet (150 mg total) by mouth daily. 30 tablet  0   cefadroxil (DURICEF) 500 MG capsule Take 1 capsule (500 mg total) by mouth 2 (two) times daily. To be taken after surgery 20 capsule 0   Cholecalciferol (DIALYVITE VITAMIN D 5000) 125 MCG (5000 UT) capsule Take 5,000 Units by mouth every other day.     Cinnamon 500 MG TABS Take 500 mg by mouth daily.     docusate sodium (COLACE) 100 MG capsule Take 1 capsule (100 mg total) by mouth daily as needed. 30 capsule 2   insulin aspart (NOVOLOG FLEXPEN) 100 UNIT/ML FlexPen 4 units injection with  each meal tid 15 mL 11   Iron-Vitamin C 65-125 MG TABS Take by mouth.     metFORMIN (GLUCOPHAGE) 500 MG tablet Take 1 tablet (500 mg total) by mouth 2 (two) times daily with a meal. 180 tablet 3   methocarbamol (ROBAXIN-750) 750 MG tablet Take 1 tablet (750 mg total) by mouth 2 (two) times daily as needed for muscle spasms. To be taken after surgery 20 tablet 2   methocarbamol (ROBAXIN-750) 750 MG tablet Take 1 tablet (750 mg total) by mouth 4 (four) times daily. 40 tablet 2   Multiple Vitamin (MULTI-VITAMINS) TABS Take 1 tablet by mouth daily.     ondansetron (ZOFRAN) 4 MG tablet Take 1 tablet (4 mg total) by mouth every 8 (eight) hours as needed for nausea or vomiting. 40 tablet 0   oxyCODONE-acetaminophen (PERCOCET) 5-325 MG tablet Take 1-2 tablets by mouth every 6 (six) hours as needed. To be taken after surgery 40 tablet 0   oxyCODONE-acetaminophen (PERCOCET) 7.5-325 MG tablet Take 1-2 tablets by mouth every 6 (six) hours as needed for severe pain. 40 tablet 0   oxyCODONE-acetaminophen (PERCOCET) 7.5-325 MG tablet Take 1-2  tabs po every 6-8 hours prn pain 40 tablet 0   Semaglutide, 1 MG/DOSE, 4 MG/3ML SOPN Inject 1 mg as directed once a week. 3 mL 1   Semaglutide,0.25 or 0.5MG /DOS, (OZEMPIC, 0.25 OR 0.5 MG/DOSE,) 2 MG/3ML SOPN Inject 0.5mg  into the skin once weekly 3 mL 11   traMADol (ULTRAM) 50 MG tablet Take 1 tablet (50 mg total) by mouth 3 (three) times daily as needed. 60 tablet 2   triamterene-hydrochlorothiazide (DYAZIDE) 37.5-25 MG capsule TAKE 1 CAPSULE BY MOUTH EVERY MORNING 90 capsule 3   TURMERIC PO Take 1,000 mg by mouth daily.     No current facility-administered medications on file prior to visit.    BP 132/86   Pulse 84   Resp 18   Ht 5\' 7"  (1.702 m)   Wt 172 lb 9.6 oz (78.3 kg)   SpO2 99%   BMI 27.03 kg/m        Objective:   Physical Exam  General Mental Status- Alert. General Appearance- Not in acute distress.   Skin General: Color- Normal Color. Moisture- Normal Moisture.  Neck Carotid Arteries- Normal color. Moisture- Normal Moisture. No carotid bruits. No JVD.  Chest and Lung Exam Auscultation: Breath Sounds:-Normal.  Cardiovascular Auscultation:Rythm- Regular. Murmurs & Other Heart Sounds:Auscultation of the heart reveals- No Murmurs.  Abdomen Inspection:-Inspeection Normal. Palpation/Percussion:Note:No mass. Palpation and Percussion of the abdomen reveal- Non Tender, Non Distended + BS, no rebound or guarding.   Neurologic Cranial Nerve exam:- CN III-XII intact(No nystagmus), symmetric smile. Strength:- 5/5 equal and symmetric strength both upper and lower extremities.   Eyes- perrl bilateral. Eoms intact.   Skin-no rash around rt eye. No obvious puffy upper or lower lid. No vesicles. Scattered papular rash in both antecubital fossa's, upper  thighs and left distal thigh.  On inspection no back rash.    Assessment & Plan:   Patient Instructions  1. Allergic reaction, initial encounter With scattered distribution of itchy rash on arms, legs and right area,  I do think probable allergic reaction.  Prescribing 6-day taper Medrol.  Counseled extensively to watch the right eye area.  If you were to get any of the vesicle/small blister eruption around the right I will go ahead and prescribe antiviral.  Patient expresses understanding.  2. Controlled type 2 diabetes mellitus without complication, without long-term current use of insulin (HCC) Currently on metformin 500 mg twice daily.  Also you know that you have currents Ozempic and next week will be due for additional injection.  Want to evaluate your 46-month blood sugar average before refilling medication.  If sugars are tightly controlled might recommend just continuing Ozempic and discontinuing metformin.  Decision to be determined after lab review.  I will also note we did discuss while you are on Medrol to check your blood sugars daily.  If sugars are over 200 let me know. - Hemoglobin A1c - Comp Met (CMET)  3. Hypertension, unspecified type Continue current BP medications.  4. Weight loss You had expressed some concern about your weight loss.  To me it seems normal in light of the fact that during a portion of the last 3 months you were on Ozempic and you describe decrease in appetite postsurgery related to antibiotic use and narcotic use.  Glad to hear that you did gain weight when you tried over the last month or so.  Advising to go ahead and schedule wellness exam in 1 week.  Might do additional studies in addition to standard wellness exams.  Follow-up in 7 days or sooner if needed.   Esperanza Richters, PA-C   Time spent with patient today was 43  minutes which consisted of chart review, discussing diagnosis, work up treatment and documentation.   Extra time spent with patient answering all questions.  Particularly discussing timeline of his weight loss.  Discussing how we would approach that in the near future.

## 2023-04-24 ENCOUNTER — Other Ambulatory Visit: Payer: Self-pay | Admitting: Medical

## 2023-05-05 ENCOUNTER — Other Ambulatory Visit: Payer: Self-pay

## 2023-05-05 ENCOUNTER — Other Ambulatory Visit (HOSPITAL_BASED_OUTPATIENT_CLINIC_OR_DEPARTMENT_OTHER): Payer: Self-pay

## 2023-05-05 ENCOUNTER — Ambulatory Visit (INDEPENDENT_AMBULATORY_CARE_PROVIDER_SITE_OTHER): Payer: Managed Care, Other (non HMO) | Admitting: Medical

## 2023-05-05 VITALS — BP 136/82 | HR 88 | Resp 18 | Ht 67.0 in | Wt 172.0 lb

## 2023-05-05 DIAGNOSIS — R21 Rash and other nonspecific skin eruption: Secondary | ICD-10-CM

## 2023-05-05 DIAGNOSIS — Z7985 Long-term (current) use of injectable non-insulin antidiabetic drugs: Secondary | ICD-10-CM

## 2023-05-05 DIAGNOSIS — R634 Abnormal weight loss: Secondary | ICD-10-CM | POA: Diagnosis not present

## 2023-05-05 DIAGNOSIS — E119 Type 2 diabetes mellitus without complications: Secondary | ICD-10-CM | POA: Diagnosis not present

## 2023-05-05 DIAGNOSIS — Z125 Encounter for screening for malignant neoplasm of prostate: Secondary | ICD-10-CM

## 2023-05-05 DIAGNOSIS — I10 Essential (primary) hypertension: Secondary | ICD-10-CM | POA: Diagnosis not present

## 2023-05-05 LAB — PSA: PSA: 2.1 ng/mL (ref 0.10–4.00)

## 2023-05-05 MED ORDER — TRIAMCINOLONE ACETONIDE 0.1 % EX CREA
1.0000 | TOPICAL_CREAM | Freq: Two times a day (BID) | CUTANEOUS | 0 refills | Status: DC
  Filled 2023-05-05: qty 30, 15d supply, fill #0

## 2023-05-05 MED ORDER — INSULIN LISPRO (1 UNIT DIAL) 100 UNIT/ML (KWIKPEN): 4.0000 [IU] | PEN_INJECTOR | Freq: Three times a day (TID) | SUBCUTANEOUS | 3 refills | Status: AC

## 2023-05-05 NOTE — Patient Instructions (Addendum)
Diabetes well controled and now overweight. Continue ozempic 0.25 mg IM weekly. Recommend maintain weight now. Mild overweight/near healthy weight now.  Rash much improved/resolved now after medrol. Recommend moisturize elbows. Can use triamcinolone sparingly/occasionally. Can use on hands as well as you report occasional hand rash. Recommend avoid excess hand washing as discussed.   Htn bp remains well controlled on current meds  At your request ordered screening psa.  Follow up October  or sooner if needed.

## 2023-05-05 NOTE — Progress Notes (Signed)
Subjective:    Patient ID: Craig House, male    DOB: [birth year], 62 y.o.   MRN: [id number]  HPI  Pt in for follow up.  Last AVS below.  "1. Allergic reaction, initial encounter With scattered distribution of itchy rash on arms, legs and right area, I do think probable allergic reaction.  Prescribing 6-day taper Medrol.  Counseled extensively to watch the right eye area.  If you were to get any of the vesicle/small blister eruption around the right I will go ahead and prescribe antiviral.  Patient expresses understanding.   2. Controlled type 2 diabetes mellitus without complication, without long-term current use of insulin (HCC) Currently on metformin 500 mg twice daily.  Also you know that you have currents Ozempic and next week will be due for additional injection.  Want to evaluate your 13-month blood sugar average before refilling medication.  If sugars are tightly controlled might recommend just continuing Ozempic and discontinuing metformin.  Decision to be determined after lab review.  I will also note we did discuss while you are on Medrol to check your blood sugars daily.  If sugars are over 200 let me know. - Hemoglobin A1c - Comp Met (CMET)   3. Hypertension, unspecified type Continue current BP medications.   4. Weight loss You had expressed some concern about your weight loss.  To me it seems normal in light of the fact that during a portion of the last 3 months you were on Ozempic and you describe decrease in appetite postsurgery related to antibiotic use and narcotic use.  Glad to hear that you did gain weight when you tried over the last month or so.  Advising to go ahead and schedule wellness exam in 1 week.  Might do additional studies in addition to standard wellness exams."  Rash has stopped itching with medrol.    Diabetes and obesity- pt is on ozempic 0.25 mg weekly. Last A1c was 4.9.   Htn- bp controlled on current medication. Pt checked bp today 121/81 at  6 am.  Weight loss with ozempic and some post surgery. Weight stable today.    Review of Systems  Constitutional:  Negative for chills, fatigue and fever.  Eyes:  Negative for discharge and itching.       See hpi.  Respiratory:  Negative for cough, choking, chest tightness and wheezing.   Cardiovascular:  Negative for chest pain and palpitations.  Gastrointestinal:  Negative for abdominal distention, abdominal pain and blood in stool.  Genitourinary:  Negative for dysuria, flank pain and frequency.  Musculoskeletal:  Negative for back pain.  Skin:  Negative for rash.  Neurological:  Negative for facial asymmetry and headaches.  Hematological:  Negative for adenopathy. Does not bruise/bleed easily.  Psychiatric/Behavioral:  Negative for behavioral problems, confusion, dysphoric mood and suicidal ideas. The patient is not nervous/anxious and is not hyperactive.     Past Medical History:  Diagnosis Date   Allergy    spring   Anemia    Asthma    Diabetes mellitus without complication (HCC)    type II   Hyperplastic colon polyp    Hypertension    Obesity      Social History   Socioeconomic History   Marital status: Single    Spouse name: Not on file   Number of children: Not on file   Years of education: Not on file   Highest education level: Associate degree: occupational, Scientist, product/process development, or vocational program  Occupational History  Not on file  Tobacco Use   Smoking status: Former   Smokeless tobacco: Never  Vaping Use   Vaping Use: Never used  Substance and Sexual Activity   Alcohol use: Yes    Alcohol/week: 1.0 standard drink of alcohol    Types: 1 Standard drinks or equivalent per week    Comment: 1 glass of red wine at night   Drug use: No   Sexual activity: Yes  Other Topics Concern   Not on file  Social History Narrative   Not on file   Social Determinants of Health   Financial Resource Strain: Low Risk  (04/20/2023)   Overall Financial Resource Strain  (CARDIA)    Difficulty of Paying Living Expenses: Not very hard  Food Insecurity: Food Insecurity Present (04/20/2023)   Hunger Vital Sign    Worried About Running Out of Food in the Last Year: Sometimes true    Ran Out of Food in the Last Year: Sometimes true  Transportation Needs: No Transportation Needs (04/20/2023)   PRAPARE - Administrator, Civil Service (Medical): No    Lack of Transportation (Non-Medical): No  Physical Activity: Sufficiently Active (04/20/2023)   Exercise Vital Sign    Days of Exercise per Week: 4 days    Minutes of Exercise per Session: 40 min  Stress: No Stress Concern Present (04/20/2023)   [employer] of Occupational Health - Occupational Stress Questionnaire    Feeling of Stress : Only a little  Social Connections: Moderately Integrated (04/20/2023)   Social Connection and Isolation Panel [NHANES]    Frequency of Communication with Friends and Family: More than three times a week    Frequency of Social Gatherings with Friends and Family: Once a week    Attends Religious Services: More than 4 times per year    Active Member of Clubs or Organizations: Yes    Attends Banker Meetings: More than 4 times per year    Marital Status: Divorced  Intimate Partner Violence: Not At Risk (01/09/2023)   Humiliation, Afraid, Rape, and Kick questionnaire    Fear of Current or Ex-Partner: No    Emotionally Abused: No    Physically Abused: No    Sexually Abused: No    Past Surgical History:  Procedure Laterality Date   KNEE ARTHROSCOPY Bilateral    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   Procedure: LEFT TOTAL KNEE ARTHROPLASTY;  Surgeon: Tarry Kos, MD;  Location: [facility] OR;  Service: Orthopedics;  Laterality: Left;    Family History  Problem Relation Age of Onset   Heart attack Mother    Cancer Father    Heart disease Father    Colon cancer Neg Hx     No Known Allergies  Current Outpatient Medications on File Prior to Visit   Medication Sig Dispense Refill   albuterol (VENTOLIN HFA) 108 (90 Base) MCG/ACT inhaler INHALE 2 PUFFS BY MOUTH EVERY 6 HOURS AS NEEDED FOR WHEEZING OR SHORTNESS OF BREATH 8.5 g 0   amLODipine (NORVASC) 10 MG tablet 1 tab po q day 90 tablet 1   aspirin EC 81 MG tablet Take 1 tablet (81 mg total) by mouth in the morning and at bedtime. To be taken after surgery to prevent blood clots 84 tablet 0   atorvastatin (LIPITOR) 20 MG tablet TAKE 1 TABLET BY MOUTH EVERY DAY 90 tablet 3   Brimonidine Tartrate (LUMIFY) 0.025 % SOLN Place 1 drop into both eyes daily as needed (redness).  buPROPion (WELLBUTRIN XL) 150 MG 24 hr tablet Take 1 tablet (150 mg total) by mouth daily. 30 tablet 0   cefadroxil (DURICEF) 500 MG capsule Take 1 capsule (500 mg total) by mouth 2 (two) times daily. To be taken after surgery 20 capsule 0   Cholecalciferol (DIALYVITE VITAMIN D 5000) 125 MCG (5000 UT) capsule Take 5,000 Units by mouth every other day.     Cinnamon 500 MG TABS Take 500 mg by mouth daily.     docusate sodium (COLACE) 100 MG capsule Take 1 capsule (100 mg total) by mouth daily as needed. 30 capsule 2   Iron-Vitamin C 65-125 MG TABS Take by mouth.     metFORMIN (GLUCOPHAGE) 500 MG tablet TAKE 1 TABLET(500 MG) BY MOUTH TWICE DAILY WITH A MEAL 180 tablet 3   methocarbamol (ROBAXIN-750) 750 MG tablet Take 1 tablet (750 mg total) by mouth 2 (two) times daily as needed for muscle spasms. To be taken after surgery 20 tablet 2   methocarbamol (ROBAXIN-750) 750 MG tablet Take 1 tablet (750 mg total) by mouth 4 (four) times daily. 40 tablet 2   methylPREDNISolone (MEDROL) 4 MG tablet Standard 6 day taper dose. 21 tablet 0   Multiple Vitamin (MULTI-VITAMINS) TABS Take 1 tablet by mouth daily.     ondansetron (ZOFRAN) 4 MG tablet Take 1 tablet (4 mg total) by mouth every 8 (eight) hours as needed for nausea or vomiting. 40 tablet 0   oxyCODONE-acetaminophen (PERCOCET) 5-325 MG tablet Take 1-2 tablets by mouth every 6  (six) hours as needed. To be taken after surgery 40 tablet 0   oxyCODONE-acetaminophen (PERCOCET) 7.5-325 MG tablet Take 1-2 tablets by mouth every 6 (six) hours as needed for severe pain. 40 tablet 0   oxyCODONE-acetaminophen (PERCOCET) 7.5-325 MG tablet Take 1-2 tabs po every 6-8 hours prn pain 40 tablet 0   Semaglutide, 1 MG/DOSE, 4 MG/3ML SOPN Inject 1 mg as directed once a week. 3 mL 1   Semaglutide,0.25 or 0.5MG /DOS, (OZEMPIC, 0.25 OR 0.5 MG/DOSE,) 2 MG/3ML SOPN Inject 0.5mg  into the skin once weekly 3 mL 11   traMADol (ULTRAM) 50 MG tablet Take 1 tablet (50 mg total) by mouth 3 (three) times daily as needed. 60 tablet 2   triamterene-hydrochlorothiazide (DYAZIDE) 37.5-25 MG capsule TAKE 1 CAPSULE BY MOUTH EVERY MORNING 90 capsule 3   TURMERIC PO Take 1,000 mg by mouth daily.     No current facility-administered medications on file prior to visit.    BP 136/82   Pulse 88   Resp 18   Ht 5\' 7"  (1.702 m)   Wt 172 lb (78 kg)   SpO2 99%   BMI 26.94 kg/m        Objective:   Physical Exam  General Mental Status- Alert. General Appearance- Not in acute distress.   Skin Much improved rash on arms. Rt eye region rash cleared.  Neck Carotid Arteries- Normal color. Moisture- Normal Moisture. No carotid bruits. No JVD.  Chest and Lung Exam Auscultation: Breath Sounds:-Normal.  Cardiovascular Auscultation:Rythm- Regular. Murmurs & Other Heart Sounds:Auscultation of the heart reveals- No Murmurs.  Abdomen Inspection:-Inspeection Normal. Palpation/Percussion:Note:No mass. Palpation and Percussion of the abdomen reveal- Non Tender, Non Distended + BS, no rebound or guarding.   Neurologic Cranial Nerve exam:- CN III-XII intact(No nystagmus), symmetric smile. Strength:- 5/5 equal and symmetric strength both upper and lower extremities.       Assessment & Plan:   Patient Instructions  Diabetes well controled and now overweight.  Continue ozempic 0.25 mg IM weekly. Recommend  maintain weight now. Mild overweight/near healthy weight now.  Rash much improved/resolved now after medrol. Recommend moisturize elbows. Can use triamcinolone sparingly/occasionally. Can use on hands as well as you report occasional hand rash. Recommend avoid excess hand washing as discussed.   Htn bp remains well controlled on current meds  At your request ordered screening psa.  Follow up October  or sooner if needed.   Esperanza Richters, PA-C

## 2023-06-28 ENCOUNTER — Other Ambulatory Visit (INDEPENDENT_AMBULATORY_CARE_PROVIDER_SITE_OTHER): Payer: Managed Care, Other (non HMO)

## 2023-06-28 ENCOUNTER — Ambulatory Visit (INDEPENDENT_AMBULATORY_CARE_PROVIDER_SITE_OTHER): Payer: Managed Care, Other (non HMO) | Admitting: Physician Assistant

## 2023-06-28 ENCOUNTER — Encounter: Payer: Self-pay | Admitting: Physician Assistant

## 2023-06-28 DIAGNOSIS — Z96652 Presence of left artificial knee joint: Secondary | ICD-10-CM

## 2023-06-28 MED ORDER — ACETAMINOPHEN-CODEINE 300-30 MG PO TABS: 1.0000 | ORAL_TABLET | Freq: Two times a day (BID) | ORAL | 0 refills | Status: DC | PRN

## 2023-06-28 NOTE — Progress Notes (Signed)
   Post-Op Visit Note   Patient: Craig House           Date of Birth: [birth year]           MRN: [id number] Visit Date: 06/28/2023 PCP: Esperanza Richters, PA-C   Assessment & Plan:  Chief Complaint:  Chief Complaint  Patient presents with   Left Knee - Follow-up    Left total knee arthroplasty 01/09/2023   Visit Diagnoses:  1. Status post total left knee replacement     Plan: Patient is a pleasant 62 year old gentleman who comes in today 6 months status post left total knee replacement 01/09/2023.  He has been doing well.  He continues to work on a home exercise program and feels as though he could potentially regain more range of motion.  He does notice some discomfort throughout the day and night which has improved with time.  Examination of his left knee reveals range of motion 0 to 120 degrees.  He is stable to valgus and varus stress.  He is neurovascular intact distally.  At this point, patient would like to return to physical therapy.  Referral has been made.  He will continue to work on home exercise program as well.  Dental prophylaxis reinforced.  Follow-up in 6 months for repeat evaluation and 2 view x-rays of the left knee.  Call with concerns or questions.  Follow-Up Instructions: Return in about 6 months (around 12/29/2023).   Orders:  Orders Placed This Encounter  Procedures   XR Knee 1-2 Views Left   No orders of the defined types were placed in this encounter.   Imaging: XR Knee 1-2 Views Left  Result Date: 06/28/2023 X-rays demonstrate a well-seated prosthesis without complication.   PMFS History: Patient Active Problem List   Diagnosis Date Noted   Primary osteoarthritis of left knee 01/09/2023   Status post total left knee replacement 01/09/2023   Bilateral primary osteoarthritis of knee 06/19/2018   Mild intermittent asthma, uncomplicated 01/18/2016   HTN (hypertension) 12/31/2011   Past Medical History:  Diagnosis Date   Allergy    spring    Anemia    Asthma    Diabetes mellitus without complication (HCC)    type II   Hyperplastic colon polyp    Hypertension    Obesity     Family History  Problem Relation Age of Onset   Heart attack Mother    Cancer Father    Heart disease Father    Colon cancer Neg Hx     Past Surgical History:  Procedure Laterality Date   KNEE ARTHROSCOPY Bilateral    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   Procedure: LEFT TOTAL KNEE ARTHROPLASTY;  Surgeon: Tarry Kos, MD;  Location: [facility] OR;  Service: Orthopedics;  Laterality: Left;   Social History   Occupational History   Not on file  Tobacco Use   Smoking status: Former   Smokeless tobacco: Never  Vaping Use   Vaping status: Never Used  Substance and Sexual Activity   Alcohol use: Yes    Alcohol/week: 1.0 standard drink of alcohol    Types: 1 Standard drinks or equivalent per week    Comment: 1 glass of red wine at night   Drug use: No   Sexual activity: Yes

## 2023-06-30 ENCOUNTER — Other Ambulatory Visit: Payer: Self-pay | Admitting: Physician Assistant

## 2023-07-06 ENCOUNTER — Telehealth: Payer: Self-pay | Admitting: Medical

## 2023-07-06 ENCOUNTER — Other Ambulatory Visit (HOSPITAL_BASED_OUTPATIENT_CLINIC_OR_DEPARTMENT_OTHER): Payer: Self-pay

## 2023-07-06 MED ORDER — TRIAMTERENE-HCTZ 37.5-25 MG PO CAPS: 1.0000 | ORAL_CAPSULE | Freq: Every morning | ORAL | 3 refills | Status: DC

## 2023-07-06 NOTE — Addendum Note (Signed)
Addended by: Maximino Sarin on: 07/06/2023 02:56 PM   Modules accepted: Orders

## 2023-07-06 NOTE — Telephone Encounter (Signed)
Rx sent 

## 2023-07-06 NOTE — Telephone Encounter (Signed)
Prescription Request  07/06/2023  Is this a "Controlled Substance" medicine? No  LOV: 05/05/2023  What is the name of the medication or equipment?   triamterene-hydrochlorothiazide (DYAZIDE) 37.5-25 MG capsule [[id number]]  Have you contacted your pharmacy to request a refill? Yes   Which pharmacy would you like this sent to?   [facility] [address] [place] [address] Phone: [phone] Fax: [phone]    Patient notified that their request is being sent to the clinical staff for review and that they should receive a response within 2 business days.   Please advise at Mobile [phone] (mobile)

## 2023-08-25 ENCOUNTER — Ambulatory Visit (INDEPENDENT_AMBULATORY_CARE_PROVIDER_SITE_OTHER): Payer: Managed Care, Other (non HMO) | Admitting: Medical

## 2023-08-25 ENCOUNTER — Other Ambulatory Visit (HOSPITAL_BASED_OUTPATIENT_CLINIC_OR_DEPARTMENT_OTHER): Payer: Self-pay

## 2023-08-25 VITALS — BP 130/82 | HR 92 | Temp 98.0°F | Resp 16 | Ht 67.0 in | Wt 184.4 lb

## 2023-08-25 DIAGNOSIS — I10 Essential (primary) hypertension: Secondary | ICD-10-CM | POA: Diagnosis not present

## 2023-08-25 DIAGNOSIS — R21 Rash and other nonspecific skin eruption: Secondary | ICD-10-CM | POA: Diagnosis not present

## 2023-08-25 DIAGNOSIS — E785 Hyperlipidemia, unspecified: Secondary | ICD-10-CM

## 2023-08-25 DIAGNOSIS — E119 Type 2 diabetes mellitus without complications: Secondary | ICD-10-CM

## 2023-08-25 MED ORDER — TRIAMCINOLONE ACETONIDE 0.1 % EX CREA
1.0000 | TOPICAL_CREAM | Freq: Two times a day (BID) | CUTANEOUS | 1 refills | Status: DC
Start: 2023-08-25 — End: 2024-01-24
  Filled 2023-08-25: qty 30, 15d supply, fill #0
  Filled 2023-10-06: qty 30, 15d supply, fill #1

## 2023-08-25 NOTE — Addendum Note (Signed)
Addended by: Gwenevere Abbot on: 08/25/2023 02:01 PM   Modules accepted: Orders

## 2023-08-25 NOTE — Progress Notes (Signed)
Subjective:    Patient ID: Craig House, male    DOB: 1961-08-06, 62 y.o.   MRN: [id number]  HPI  Pt in for follow up.  Last AVS below in "  Diabetes well controled and now overweight. Continue ozempic 0.25 mg IM weekly. Recommend maintain weight now. Mild overweight/near healthy weight now.   Rash much improved/resolved now after medrol. Recommend moisturize elbows. Can use triamcinolone sparingly/occasionally. Can use on hands as well as you report occasional hand rash. Recommend avoid excess hand washing as discussed.    Htn bp remains well controlled on current meds   Last A1c 4.9. Still on ozempic.  Htn- pt on amlodipine 10 mg daily.  High cholesterol- on atorvastatin 20 mg daily.  Pt updates me that 2 weeks ago he had wrist, elbow creases both elbow and on side of his face. Pt had medrol  refil? On last rx I don't see that there was a refill. He uses and rash areas over past year he had intermittent rashes. Pt in past he never identified cause but does sometimes associate with yard work.  Flu vaccine declined.   Review of Systems  Constitutional:  Negative for chills, fatigue and fever.  HENT:  Positive for congestion and postnasal drip. Negative for sinus pressure, sinus pain, sneezing and sore throat.   Respiratory:  Negative for cough, chest tightness and wheezing.   Cardiovascular:  Negative for chest pain and palpitations.  Gastrointestinal:  Negative for abdominal pain and blood in stool.  Genitourinary:  Negative for dysuria and frequency.  Musculoskeletal:  Negative for back pain, joint swelling and myalgias.  Neurological:  Negative for dizziness, speech difficulty and light-headedness.  Hematological:  Negative for adenopathy. Does not bruise/bleed easily.  Psychiatric/Behavioral:  Negative for behavioral problems and decreased concentration.     Past Medical History:  Diagnosis Date   Allergy    spring   Anemia    Asthma    Diabetes mellitus  without complication (HCC)    type II   Hyperplastic colon polyp    Hypertension    Obesity      Social History   Socioeconomic History   Marital status: Single    Spouse name: Not on file   Number of children: Not on file   Years of education: Not on file   Highest education level: Associate degree: occupational, Scientist, product/process development, or vocational program  Occupational History   Not on file  Tobacco Use   Smoking status: Former   Smokeless tobacco: Never  Vaping Use   Vaping status: Never Used  Substance and Sexual Activity   Alcohol use: Yes    Alcohol/week: 1.0 standard drink of alcohol    Types: 1 Standard drinks or equivalent per week    Comment: 1 glass of red wine at night   Drug use: No   Sexual activity: Yes  Other Topics Concern   Not on file  Social History Narrative   Not on file   Social Determinants of Health   Financial Resource Strain: Low Risk  (08/25/2023)   Overall Financial Resource Strain (CARDIA)    Difficulty of Paying Living Expenses: Not hard at all  Food Insecurity: No Food Insecurity (08/25/2023)   Hunger Vital Sign    Worried About Running Out of Food in the Last Year: Never true    Ran Out of Food in the Last Year: Never true  Transportation Needs: No Transportation Needs (08/25/2023)   PRAPARE - Transportation    Lack  of Transportation (Medical): No    Lack of Transportation (Non-Medical): No  Physical Activity: Sufficiently Active (08/25/2023)   Exercise Vital Sign    Days of Exercise per Week: 4 days    Minutes of Exercise per Session: 60 min  Stress: No Stress Concern Present (08/25/2023)   [employer] of Occupational Health - Occupational Stress Questionnaire    Feeling of Stress : Not at all  Social Connections: Moderately Integrated (08/25/2023)   Social Connection and Isolation Panel [NHANES]    Frequency of Communication with Friends and Family: More than three times a week    Frequency of Social Gatherings with Friends and  Family: More than three times a week    Attends Religious Services: More than 4 times per year    Active Member of Clubs or Organizations: Yes    Attends Banker Meetings: More than 4 times per year    Marital Status: Divorced  Intimate Partner Violence: Not At Risk (01/09/2023)   Humiliation, Afraid, Rape, and Kick questionnaire    Fear of Current or Ex-Partner: No    Emotionally Abused: No    Physically Abused: No    Sexually Abused: No    Past Surgical History:  Procedure Laterality Date   KNEE ARTHROSCOPY Bilateral    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 30 tablet 0   albuterol (VENTOLIN HFA) 108 (90 Base) MCG/ACT inhaler INHALE 2 PUFFS BY MOUTH EVERY 6 HOURS AS NEEDED FOR WHEEZING OR SHORTNESS OF BREATH 8.5 g 0   amLODipine (NORVASC) 10 MG tablet 1 tab po q day 90 tablet 1   aspirin EC 81 MG tablet Take 1 tablet (81 mg total) by mouth in the morning and at bedtime. To be taken after surgery to prevent blood clots 84 tablet 0   atorvastatin (LIPITOR) 20 MG tablet TAKE 1 TABLET BY MOUTH EVERY DAY 90 tablet 3   Brimonidine Tartrate (LUMIFY) 0.025 % SOLN Place 1 drop into both eyes daily as needed (redness).     buPROPion (WELLBUTRIN XL) 150 MG 24 hr tablet Take 1 tablet (150 mg total) by mouth daily. 30 tablet 0   cefadroxil (DURICEF) 500 MG capsule Take 1 capsule (500 mg total) by mouth 2 (two) times daily. To be taken after surgery 20 capsule 0   Cholecalciferol  (DIALYVITE VITAMIN D 5000) 125 MCG (5000 UT) capsule Take 5,000 Units by mouth every other day.     Cinnamon 500 MG TABS Take 500 mg by mouth daily.     docusate sodium (COLACE) 100 MG capsule Take 1 capsule (100 mg total) by mouth daily as needed. 30 capsule 2   insulin lispro (HUMALOG KWIKPEN) 100 UNIT/ML KwikPen Inject 4 Units into the skin 3 (three) times daily. 15 mL 3   Iron-Vitamin C 65-125 MG TABS Take by mouth.     metFORMIN (GLUCOPHAGE) 500 MG tablet TAKE 1 TABLET(500 MG) BY MOUTH TWICE  DAILY WITH A MEAL 180 tablet 3   methocarbamol (ROBAXIN-750) 750 MG tablet Take 1 tablet (750 mg total) by mouth 2 (two) times daily as needed for muscle spasms. To be taken after surgery 20 tablet 2   methocarbamol (ROBAXIN-750) 750 MG tablet Take 1 tablet (750 mg total) by mouth 4 (four) times daily. 40 tablet 2   methylPREDNISolone (MEDROL) 4 MG TBPK tablet Take as directed on pack for 6 days. 21 tablet 0   Multiple Vitamin (MULTI-VITAMINS) TABS Take 1 tablet by mouth daily.     ondansetron (ZOFRAN) 4 MG tablet Take 1 tablet (4 mg total) by mouth every 8 (eight) hours as needed for nausea or vomiting. 40 tablet 0   oxyCODONE-acetaminophen (PERCOCET) 5-325 MG tablet Take 1-2 tablets by mouth every 6 (six) hours as needed. To be taken after surgery 40 tablet 0   oxyCODONE-acetaminophen (PERCOCET) 7.5-325 MG tablet Take 1-2 tablets by mouth every 6 (six) hours as needed for severe pain. 40 tablet 0   oxyCODONE-acetaminophen (PERCOCET) 7.5-325 MG tablet Take 1-2 tabs po every 6-8 hours prn pain 40 tablet 0   Semaglutide, 1 MG/DOSE, 4 MG/3ML SOPN Inject 1 mg as directed once a week. 3 mL 1   Semaglutide,0.25 or 0.5MG /DOS, (OZEMPIC, 0.25 OR 0.5 MG/DOSE,) 2 MG/3ML SOPN Inject 0.5mg  into the skin once weekly 3 mL 11   traMADol (ULTRAM) 50 MG tablet Take 1 tablet (50 mg total) by mouth 3 (three) times daily as needed. 60 tablet 2   triamcinolone cream (KENALOG) 0.1 % Apply 1 Application topically 2 (two) times  daily. 30 g 0   triamterene-hydrochlorothiazide (DYAZIDE) 37.5-25 MG capsule Take 1 each (1 capsule total) by mouth every morning. 90 capsule 3   TURMERIC PO Take 1,000 mg by mouth daily.     No current facility-administered medications on file prior to visit.    BP 130/82 (BP Location: Left Arm, Patient Position: Sitting, Cuff Size: Normal)   Pulse 92   Temp 98 F (36.7 C) (Oral)   Resp 16   Ht 5\' 7"  (1.702 m)   Wt 184 lb 6.4 oz (83.6 kg)   SpO2 97%   BMI 28.88 kg/m        Objective:   Physical Exam  General Mental Status- Alert. General Appearance- Not in acute distress.   Skin General: Color- Normal Color. Moisture- Normal Moisture.  Neck Carotid Arteries- Normal color. Moisture- Normal Moisture. No carotid bruits. No JVD.  Chest and Lung Exam Auscultation: Breath Sounds:-Normal.  Cardiovascular Auscultation:Rythm- Regular. Murmurs & Other Heart Sounds:Auscultation of the heart reveals- No Murmurs.  Abdomen Inspection:-Inspeection Normal. Palpation/Percussion:Note:No mass. Palpation and Percussion of the abdomen reveal- Non Tender, Non Distended + BS, no rebound or guarding.   Neurologic Cranial Nerve exam:- CN III-XII intact(No nystagmus), symmetric smile. Strength:- 5/5 equal and symmetric strength both upper and lower extremities.       Assessment & Plan:   Patient Instructions  1. Controlled type 2 diabetes mellitus without complication, without long-term current use of insulin (HCC) Continue ozempic. Determine dose after review of results. - Hemoglobin A1c - Comp Met (CMET)  2. Hypertension, unspecified type -controlled bp. Continue amlodipine 10 mg daily  3. Rash No rash presently but intermittent and recurrent for one year. Placed referral. Give me update on appt date. If very long delay considering allergist referral option as allergy may be playing a role. - Ambulatory referral to Dermatology  4. Hyperlipidemia, unspecified hyperlipidemia  type - continue atorvastatin and recheck  level. - Lipid panel   4 days mild allergy type syptoms. Advise flonase in morning and if later in day mild nasal congested saline nasal spray.  Follow up date to be determined after lab review.     Esperanza Richters, PA-C

## 2023-08-25 NOTE — Addendum Note (Signed)
Addended by: Maximino Sarin on: 08/25/2023 02:04 PM   Modules accepted: Orders

## 2023-08-25 NOTE — Patient Instructions (Addendum)
1. Controlled type 2 diabetes mellitus without complication, without long-term current use of insulin (HCC) Continue ozempic. Determine dose after review of results. - Hemoglobin A1c - Comp Met (CMET)  2. Hypertension, unspecified type -controlled bp. Continue amlodipine 10 mg daily  3. Rash No rash presently but intermittent and recurrent for one year. Placed referral. Give me update on appt date. If very long delay considering allergist referral option as allergy may be playing a role. - Ambulatory referral to Dermatology  4. Hyperlipidemia, unspecified hyperlipidemia type - continue atorvastatin and recheck level. - Lipid panel   4 days mild allergy type symptoms. Advise flonase in morning and if later in day mild nasal congested saline nasal spray.  Recommend getting shingrix vaccine and flu vaccine. Both defered today  Follow up date to be determined after lab review.

## 2023-08-26 LAB — LIPID PANEL
Cholesterol: 148 mg/dL (ref <200)
HDL: 47 mg/dL (ref >=40)
LDL Cholesterol (Calc): 82 mg/dL
Non-HDL Cholesterol (Calc): 101 mg/dL (ref <130)
Total CHOL/HDL Ratio: 3.1 (calc) (ref <5.0)
Triglycerides: 93 mg/dL (ref <150)

## 2023-08-26 LAB — COMPREHENSIVE METABOLIC PANEL
AG Ratio: 1.3 (calc) (ref 1.0–2.5)
ALT: 25 U/L (ref 9–46)
AST: 20 U/L (ref 10–35)
Albumin: 4.3 g/dL (ref 3.6–5.1)
Alkaline phosphatase (APISO): 55 U/L (ref 35–144)
BUN: 15 mg/dL (ref 7–25)
CO2: 27 mmol/L (ref 20–32)
Calcium: 9.8 mg/dL (ref 8.6–10.3)
Chloride: 99 mmol/L (ref 98–110)
Creat: 1.32 mg/dL (ref 0.70–1.35)
Globulin: 3.3 g/dL (ref 1.9–3.7)
Glucose, Bld: 91 mg/dL (ref 65–99)
Potassium: 4.4 mmol/L (ref 3.5–5.3)
Sodium: 136 mmol/L (ref 135–146)
Total Bilirubin: 0.9 mg/dL (ref 0.2–1.2)
Total Protein: 7.6 g/dL (ref 6.1–8.1)

## 2023-08-26 LAB — HEMOGLOBIN A1C
Hgb A1c MFr Bld: 5.4 %{Hb} (ref <5.7)
Mean Plasma Glucose: 108 mg/dL
eAG (mmol/L): 6 mmol/L

## 2023-08-26 LAB — MICROALBUMIN / CREATININE URINE RATIO
Creatinine, Urine: 218 mg/dL (ref 20–320)
Microalb Creat Ratio: 2 mg/g{creat} (ref <30)
Microalb, Ur: 0.5 mg/dL

## 2023-08-26 MED ORDER — SEMAGLUTIDE(0.25 OR 0.5MG/DOS) 2 MG/3ML ~~LOC~~ SOPN
0.2500 mg | PEN_INJECTOR | SUBCUTANEOUS | 3 refills | Status: DC
Start: 2023-08-26 — End: 2024-01-13
  Filled 2023-08-26: qty 3, 28d supply, fill #0
  Filled 2023-10-06: qty 3, 28d supply, fill #1
  Filled 2023-11-07: qty 3, 28d supply, fill #2
  Filled 2023-11-30: qty 3, 28d supply, fill #3

## 2023-08-26 NOTE — Addendum Note (Signed)
Addended by: Gwenevere Abbot on: 08/26/2023 07:10 AM   Modules accepted: Orders

## 2023-08-28 ENCOUNTER — Other Ambulatory Visit (HOSPITAL_BASED_OUTPATIENT_CLINIC_OR_DEPARTMENT_OTHER): Payer: Self-pay

## 2023-08-29 ENCOUNTER — Encounter: Payer: Self-pay | Admitting: Medical

## 2023-09-24 ENCOUNTER — Other Ambulatory Visit: Payer: Self-pay | Admitting: Medical

## 2023-10-06 ENCOUNTER — Other Ambulatory Visit: Payer: Self-pay | Admitting: Medical

## 2023-10-09 ENCOUNTER — Other Ambulatory Visit (HOSPITAL_BASED_OUTPATIENT_CLINIC_OR_DEPARTMENT_OTHER): Payer: Self-pay

## 2023-10-09 ENCOUNTER — Telehealth: Payer: Self-pay

## 2023-10-09 NOTE — Telephone Encounter (Signed)
PA initiated via Covermymeds; KEY: [id number].   PA approved.   This request has been approved using information available on the patient's profile. CaseId:[id number];Status:Approved;Review Type:Prior Auth;Coverage Start Date:09/09/2023;Coverage End Date:10/08/2024;

## 2023-10-10 ENCOUNTER — Other Ambulatory Visit (HOSPITAL_BASED_OUTPATIENT_CLINIC_OR_DEPARTMENT_OTHER): Payer: Self-pay

## 2023-12-25 NOTE — Progress Notes (Signed)
Office Visit Note   Patient: Craig House           Date of Birth: 02/07/1961           MRN: [id number] Visit Date: 12/26/2023              Requested by: Esperanza Richters, PA-C [address] [place],  [place] [address] PCP: Esperanza Richters, PA-C   Assessment & Plan: Visit Diagnoses:  1. Status post total left knee replacement   2. Primary osteoarthritis of right knee     Plan: In regards to the left total knee arthroplasty he is doing well.  His range of motion is functional and he has no problems with it.  In regards to the right knee he has bone-on-bone varus DJD.  Based on these findings he has elected to move forward with a right total knee arthroplasty as soon as possible.  Eunice Blase will reach out to confirm surgery date.  Follow-Up Instructions: No follow-ups on file.   Orders:  Orders Placed This Encounter  Procedures   XR Knee 1-2 Views Left   XR KNEE 3 VIEW RIGHT   No orders of the defined types were placed in this encounter.     Procedures: No procedures performed   Clinical Data: No additional findings.   Subjective: Chief Complaint  Patient presents with   Left Knee - Follow-up    HPI Craig House is a 63 year old gentleman here for follow-up evaluation of advanced right knee DJD and 1 year postop check from a left total knee arthroplasty.  He is very happy with the left knee.  It has been very functional.  His right knee has been more symptomatic.  He is interested in getting on the schedule for surgery. Review of Systems  Constitutional: Negative.   HENT: Negative.    Eyes: Negative.   Respiratory: Negative.    Cardiovascular: Negative.   Gastrointestinal: Negative.   Endocrine: Negative.   Genitourinary: Negative.   Skin: Negative.   Allergic/Immunologic: Negative.   Neurological: Negative.   Hematological: Negative.   Psychiatric/Behavioral: Negative.    All other systems reviewed and are negative.    Objective: Vital Signs:  There were no vitals taken for this visit.  Physical Exam Vitals and nursing note reviewed.  Constitutional:      Appearance: He is well-developed.  Pulmonary:     Effort: Pulmonary effort is normal.  Abdominal:     Palpations: Abdomen is soft.  Skin:    General: Skin is warm.  Neurological:     Mental Status: He is alert and oriented to person, place, and time.  Psychiatric:        Behavior: Behavior normal.        Thought Content: Thought content normal.        Judgment: Judgment normal.     Ortho Exam Examination of the left knee shows a fully healed surgical scar.  Range of motion is 0 to 95 degrees.  Collaterals are stable. Examination of the right knee shows medial joint line tenderness and mild varus deformity.  Range of motion is 0 to 105 degrees.  Collaterals and cruciates are stable. Specialty Comments:  No specialty comments available.  Imaging: XR Knee 1-2 Views Left Result Date: 12/26/2023 X-rays of the left knee show a stable press-fit left total knee replacement in good alignment without any complications.  XR KNEE 3 VIEW RIGHT Result Date: 12/26/2023 X-rays demonstrate severe osteoarthritis.  Bone-on-bone joint space narrowing  with varus deformity.    PMFS History: Patient Active Problem List   Diagnosis Date Noted   Primary osteoarthritis of right knee 12/26/2023   Primary osteoarthritis of left knee 01/09/2023   Status post total left knee replacement 01/09/2023   Bilateral primary osteoarthritis of knee 06/19/2018   Mild intermittent asthma, uncomplicated 01/18/2016   HTN (hypertension) 12/31/2011   Past Medical History:  Diagnosis Date   Allergy    spring   Anemia    Asthma    Diabetes mellitus without complication (HCC)    type II   Hyperplastic colon polyp    Hypertension    Obesity     Family History  Problem Relation Age of Onset   Heart attack Mother    Cancer Father    Heart disease Father    Colon cancer Neg Hx     Past  Surgical History:  Procedure Laterality Date   KNEE ARTHROSCOPY Bilateral    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   Procedure: LEFT TOTAL KNEE ARTHROPLASTY;  Surgeon: Tarry Kos, MD;  Location: [facility] OR;  Service: Orthopedics;  Laterality: Left;   Social History   Occupational History   Not on file  Tobacco Use   Smoking status: Former   Smokeless tobacco: Never  Vaping Use   Vaping status: Never Used  Substance and Sexual Activity   Alcohol use: Yes    Alcohol/week: 1.0 standard drink of alcohol    Types: 1 Standard drinks or equivalent per week    Comment: 1 glass of red wine at night   Drug use: No   Sexual activity: Yes

## 2023-12-26 ENCOUNTER — Other Ambulatory Visit (INDEPENDENT_AMBULATORY_CARE_PROVIDER_SITE_OTHER): Payer: Self-pay

## 2023-12-26 ENCOUNTER — Ambulatory Visit (INDEPENDENT_AMBULATORY_CARE_PROVIDER_SITE_OTHER): Payer: Managed Care, Other (non HMO) | Admitting: Orthopaedic Surgery

## 2023-12-26 DIAGNOSIS — Z96652 Presence of left artificial knee joint: Secondary | ICD-10-CM

## 2023-12-26 DIAGNOSIS — M1711 Unilateral primary osteoarthritis, right knee: Secondary | ICD-10-CM

## 2023-12-27 ENCOUNTER — Other Ambulatory Visit: Payer: Self-pay

## 2024-01-12 ENCOUNTER — Ambulatory Visit: Payer: Managed Care, Other (non HMO) | Admitting: Medical

## 2024-01-12 VITALS — BP 130/79 | HR 80 | Resp 18 | Ht 67.0 in | Wt 194.2 lb

## 2024-01-12 DIAGNOSIS — E119 Type 2 diabetes mellitus without complications: Secondary | ICD-10-CM | POA: Diagnosis not present

## 2024-01-12 DIAGNOSIS — Z0001 Encounter for general adult medical examination with abnormal findings: Secondary | ICD-10-CM

## 2024-01-12 DIAGNOSIS — H9210 Otorrhea, unspecified ear: Secondary | ICD-10-CM

## 2024-01-12 DIAGNOSIS — E669 Obesity, unspecified: Secondary | ICD-10-CM

## 2024-01-12 DIAGNOSIS — Z23 Encounter for immunization: Secondary | ICD-10-CM | POA: Diagnosis not present

## 2024-01-12 DIAGNOSIS — I10 Essential (primary) hypertension: Secondary | ICD-10-CM

## 2024-01-12 DIAGNOSIS — Z789 Other specified health status: Secondary | ICD-10-CM

## 2024-01-12 DIAGNOSIS — Z7984 Long term (current) use of oral hypoglycemic drugs: Secondary | ICD-10-CM

## 2024-01-12 DIAGNOSIS — Z0000 Encounter for general adult medical examination without abnormal findings: Secondary | ICD-10-CM

## 2024-01-12 DIAGNOSIS — Z125 Encounter for screening for malignant neoplasm of prostate: Secondary | ICD-10-CM

## 2024-01-12 DIAGNOSIS — Z7985 Long-term (current) use of injectable non-insulin antidiabetic drugs: Secondary | ICD-10-CM

## 2024-01-12 NOTE — Patient Instructions (Addendum)
 For you wellness exam today I have ordered cbc, cmp and  lipid panel.  Vaccine given today. Shingrix today and flu vaccine.  Call your GI MD for repeat colonoscopy that is due.  Recommend exercise and healthy diet.  We will let you know lab results as they come in.  Follow up date appointment will be determined after lab review.    Type 2 Diabetes Mellitus A1c previously 5.4 on Ozempic 0.25mg  weekly and Metformin 500mg  BID. No side effects from Ozempic except constipation. -Check A1c with labs. -Consider increasing Ozempic to 0.5mg  weekly if A1c is elevated. -Continue Metformin 500mg  BID. -Advise high fiber diet and hydration for constipation. Consider use of MiraLAX as needed.  Hypertension Home blood pressure readings of 130/79. Currently on Amlodipine 10mg  daily. -Continue Amlodipine 10mg  daily. -Continue home blood pressure monitoring.  Hyperlipidemia On Atorvastatin 20mg  daily. -Continue Atorvastatin 20mg  daily.  Ear Discharge Daily discharge from right ear since 2020. No pain or other symptoms. -Advise daily ear cleaning with caution to avoid deep insertion of Q-tip. I think likely s soft wax mixed with water when you shower. Canal and tm normal.  General Health Maintenance -Check PSA with labs. -Advise patient to schedule colonoscopy due this year. -Administer first dose of Shingrix and flu vaccine today. -Plan for pneumonia vaccine in the future.   Preventive Care 58-16 Years Old, Male Preventive care refers to lifestyle choices and visits with your health care provider that can promote health and wellness. Preventive care visits are also called wellness exams. What can I expect for my preventive care visit? Counseling During your preventive care visit, your health care provider may ask about your: Medical history, including: Past medical problems. Family medical history. Current health, including: Emotional well-being. Home life and relationship  well-being. Sexual activity. Lifestyle, including: Alcohol, nicotine or tobacco, and drug use. Access to firearms. Diet, exercise, and sleep habits. Safety issues such as seatbelt and bike helmet use. Sunscreen use. Work and work Astronomer. Physical exam Your health care provider will check your: Height and weight. These may be used to calculate your BMI (body mass index). BMI is a measurement that tells if you are at a healthy weight. Waist circumference. This measures the distance around your waistline. This measurement also tells if you are at a healthy weight and may help predict your risk of certain diseases, such as type 2 diabetes and high blood pressure. Heart rate and blood pressure. Body temperature. Skin for abnormal spots. What immunizations do I need?  Vaccines are usually given at various ages, according to a schedule. Your health care provider will recommend vaccines for you based on your age, medical history, and lifestyle or other factors, such as travel or where you work. What tests do I need? Screening Your health care provider may recommend screening tests for certain conditions. This may include: Lipid and cholesterol levels. Diabetes screening. This is done by checking your blood sugar (glucose) after you have not eaten for a while (fasting). Hepatitis B test. Hepatitis C test. HIV (human immunodeficiency virus) test. STI (sexually transmitted infection) testing, if you are at risk. Lung cancer screening. Prostate cancer screening. Colorectal cancer screening. Talk with your health care provider about your test results, treatment options, and if necessary, the need for more tests. Follow these instructions at home: Eating and drinking  Eat a diet that includes fresh fruits and vegetables, whole grains, lean protein, and low-fat dairy products. Take vitamin and mineral supplements as recommended by your health care provider.  Do not drink alcohol if your  health care provider tells you not to drink. If you drink alcohol: Limit how much you have to 0-2 drinks a day. Know how much alcohol is in your drink. In the [place] one drink equals one 12 oz bottle of beer (355 mL), one 5 oz glass of wine (148 mL), or one 1 oz glass of hard liquor (44 mL). Lifestyle Brush your teeth every morning and night with fluoride toothpaste. Floss one time each day. Exercise for at least 30 minutes 5 or more days each week. Do not use any products that contain nicotine or tobacco. These products include cigarettes, chewing tobacco, and vaping devices, such as e-cigarettes. If you need help quitting, ask your health care provider. Do not use drugs. If you are sexually active, practice safe sex. Use a condom or other form of protection to prevent STIs. Take aspirin only as told by your health care provider. Make sure that you understand how much to take and what form to take. Work with your health care provider to find out whether it is safe and beneficial for you to take aspirin daily. Find healthy ways to manage stress, such as: Meditation, yoga, or listening to music. Journaling. Talking to a trusted person. Spending time with friends and family. Minimize exposure to UV radiation to reduce your risk of skin cancer. Safety Always wear your seat belt while driving or riding in a vehicle. Do not drive: If you have been drinking alcohol. Do not ride with someone who has been drinking. When you are tired or distracted. While texting. If you have been using any mind-altering substances or drugs. Wear a helmet and other protective equipment during sports activities. If you have firearms in your house, make sure you follow all gun safety procedures. What's next? Go to your health care provider once a year for an annual wellness visit. Ask your health care provider how often you should have your eyes and teeth checked. Stay up to date on all vaccines. This information  is not intended to replace advice given to you by your health care provider. Make sure you discuss any questions you have with your health care provider. Document Revised: 04/28/2021 Document Reviewed: 04/28/2021 Elsevier Patient Education  2024 [employer].

## 2024-01-12 NOTE — Addendum Note (Signed)
 Addended by: Maximino Sarin on: 01/12/2024 02:50 PM   Modules accepted: Orders

## 2024-01-12 NOTE — Progress Notes (Signed)
 Subjective:    Patient ID: Craig House, male    DOB: May 23, 1961, 63 y.o.   MRN: [id number]  HPI  Pt in for cpe. Pt is fasting.   Pt states working on his feet all day. He states he gets about 10626 steps a day.   Occasional minimal alcohol use and nonsmoker.  Eating healthy and trying to lose wt. He start ozempic at beginning fo march.   Due for colonoscopy.  Will get flu and shingrix vaccine.   Craig Noreene Filbert "[name]" is a 63 year old male with type 2 diabetes mellitus, hypertension, and hyperlipidemia who presents for a follow-up visit.  He has been managing his type 2 diabetes with Ozempic 0.25 mg weekly since his surgery last year, resulting in an improvement in his A1c from 7.5% to 5.4%. He experiences occasional constipation as a side effect, which he manages with hydration, a high-fiber diet, and occasional laxatives. He also takes metformin 500 mg twice daily.  His blood pressure was 130/79 mmHg at home this morning before taking his medication, but it was 148/80 mmHg during today's visit. He is currently on amlodipine 10 mg daily for hypertension management.  He is on atorvastatin 20 mg daily for hyperlipidemia management.  He experiences daily discharge from his right ear, which started in 2020. The discharge is non-painful but annoying, and he attributes it to earwax accumulation. He has tried ear irrigation in the past.  He is due for a colonoscopy, as his last one was in 2015. He has not received the shingles or pneumonia vaccines yet, and he did not get a flu vaccine last fall.         Review of Systems  Constitutional:  Negative for chills, fatigue and fever.  HENT:  Negative for congestion.        Excess cerumen rt ear at times almost daily.  Respiratory:  Negative for cough, chest tightness, shortness of breath and wheezing.   Cardiovascular:  Negative for chest pain and palpitations.  Gastrointestinal:  Negative for abdominal pain, anal  bleeding, constipation and nausea.  Genitourinary:  Negative for dysuria.  Musculoskeletal:  Negative for back pain and myalgias.  Neurological:  Negative for facial asymmetry, weakness and light-headedness.  Hematological:  Negative for adenopathy. Does not bruise/bleed easily.  Psychiatric/Behavioral:  Negative for behavioral problems and decreased concentration.    Past Medical History:  Diagnosis Date   Allergy    spring   Anemia    Asthma    Diabetes mellitus without complication (HCC)    type II   Hyperplastic colon polyp    Hypertension    Obesity      Social History   Socioeconomic History   Marital status: Single    Spouse name: Not on file   Number of children: Not on file   Years of education: Not on file   Highest education level: Some college, no degree  Occupational History   Not on file  Tobacco Use   Smoking status: Former   Smokeless tobacco: Never  Vaping Use   Vaping status: Never Used  Substance and Sexual Activity   Alcohol use: Yes    Alcohol/week: 1.0 standard drink of alcohol    Types: 1 Standard drinks or equivalent per week    Comment: 1 glass of red wine at night   Drug use: No   Sexual activity: Yes  Other Topics Concern   Not on file  Social History Narrative   Not on  file   Social Drivers of Health   Financial Resource Strain: Low Risk  (01/12/2024)   Overall Financial Resource Strain (CARDIA)    Difficulty of Paying Living Expenses: Not very hard  Food Insecurity: No Food Insecurity (01/12/2024)   Hunger Vital Sign    Worried About Running Out of Food in the Last Year: Never true    Ran Out of Food in the Last Year: Never true  Transportation Needs: No Transportation Needs (01/12/2024)   PRAPARE - Administrator, Civil Service (Medical): No    Lack of Transportation (Non-Medical): No  Physical Activity: Sufficiently Active (01/12/2024)   Exercise Vital Sign    Days of Exercise per Week: 3 days    Minutes of Exercise  per Session: 60 min  Stress: No Stress Concern Present (01/12/2024)   [employer] of Occupational Health - Occupational Stress Questionnaire    Feeling of Stress : Only a little  Social Connections: Moderately Integrated (01/12/2024)   Social Connection and Isolation Panel [NHANES]    Frequency of Communication with Friends and Family: More than three times a week    Frequency of Social Gatherings with Friends and Family: Twice a week    Attends Religious Services: More than 4 times per year    Active Member of Clubs or Organizations: No    Attends Engineer, structural: More than 4 times per year    Marital Status: Divorced  Intimate Partner Violence: Not At Risk (01/09/2023)   Humiliation, Afraid, Rape, and Kick questionnaire    Fear of Current or Ex-Partner: No    Emotionally Abused: No    Physically Abused: No    Sexually Abused: No    Past Surgical History:  Procedure Laterality Date   KNEE ARTHROSCOPY Bilateral    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 30 tablet 0   albuterol (VENTOLIN HFA) 108 (90 Base) MCG/ACT inhaler INHALE 2 PUFFS BY MOUTH EVERY 6 HOURS AS NEEDED FOR WHEEZING OR SHORTNESS OF BREATH 8.5 g 0   amLODipine (NORVASC) 10 MG tablet TAKE 1 TABLET BY MOUTH EVERY DAY 90 tablet 1   atorvastatin (LIPITOR) 20 MG tablet TAKE 1 TABLET BY MOUTH EVERY DAY 90 tablet 3   Brimonidine Tartrate (LUMIFY) 0.025 % SOLN Place 1 drop into both eyes daily as needed  (redness).     buPROPion (WELLBUTRIN XL) 150 MG 24 hr tablet Take 1 tablet (150 mg total) by mouth daily. 30 tablet 0   cefadroxil (DURICEF) 500 MG capsule Take 1 capsule (500 mg total) by mouth 2 (two) times daily. To be taken after surgery 20 capsule 0   Cholecalciferol (DIALYVITE VITAMIN D 5000) 125 MCG (5000 UT) capsule Take 5,000 Units by mouth every other day.     Cinnamon 500 MG TABS Take 500 mg by mouth daily.     insulin lispro (HUMALOG KWIKPEN) 100 UNIT/ML KwikPen Inject 4 Units into the  skin 3 (three) times daily. 15 mL 3   Iron-Vitamin C 65-125 MG TABS Take by mouth.     metFORMIN (GLUCOPHAGE) 500 MG tablet TAKE 1 TABLET(500 MG) BY MOUTH TWICE DAILY WITH A MEAL 180 tablet 3   methocarbamol (ROBAXIN-750) 750 MG tablet Take 1 tablet (750 mg total) by mouth 2 (two) times daily as needed for muscle spasms. To be taken after surgery 20 tablet 2   methocarbamol (ROBAXIN-750) 750 MG tablet Take 1 tablet (750 mg total) by mouth 4 (four) times daily. 40 tablet 2   methylPREDNISolone (MEDROL) 4 MG TBPK tablet Take as directed on pack for 6 days. 21 tablet 0   Multiple Vitamin (MULTI-VITAMINS) TABS Take 1 tablet by mouth daily.     ondansetron (ZOFRAN) 4 MG tablet Take 1 tablet (4 mg total) by mouth every 8 (eight) hours as needed for nausea or vomiting. 40 tablet 0   oxyCODONE-acetaminophen (PERCOCET) 5-325 MG tablet Take 1-2 tablets by mouth every 6 (six) hours as needed. To be taken after surgery 40 tablet 0   oxyCODONE-acetaminophen (PERCOCET) 7.5-325 MG tablet Take 1-2 tablets by mouth every 6 (six) hours as needed for severe pain. 40 tablet 0   oxyCODONE-acetaminophen (PERCOCET) 7.5-325 MG tablet Take 1-2 tabs po every 6-8 hours prn pain 40 tablet 0   Semaglutide,0.25 or 0.5MG /DOS, 2 MG/3ML SOPN Inject 0.25 mg into the skin once a week. 3 mL 3   traMADol (ULTRAM) 50 MG tablet Take 1 tablet (50 mg total) by mouth 3 (three) times daily as needed. 60 tablet 2   triamcinolone cream (KENALOG) 0.1  % Apply 1 Application topically 2 (two) times daily. 30 g 0   triamcinolone cream (KENALOG) 0.1 % Apply 1 Application topically 2 (two) times daily. 30 g 1   triamterene-hydrochlorothiazide (DYAZIDE) 37.5-25 MG capsule Take 1 each (1 capsule total) by mouth every morning. 90 capsule 3   TURMERIC PO Take 1,000 mg by mouth daily.     No current facility-administered medications on file prior to visit.    BP 130/79   Pulse 80   Resp 18   Ht 5\' 7"  (1.702 m)   Wt 194 lb 3.2 oz (88.1 kg)   SpO2 100%   BMI 30.42 kg/m        Objective:   Physical Exam  General Mental Status- Alert. General Appearance- Not in acute distress.   Skin General: Color- Normal Color. Moisture- Normal Moisture.  Neck Carotid Arteries- Normal color. Moisture- Normal Moisture. No carotid bruits. No JVD.  Chest and Lung Exam Auscultation: Breath Sounds:-CTA  Cardiovascular Auscultation:Rythm- RRR Murmurs & Other Heart Sounds:Auscultation of the heart reveals- No Murmurs.  Abdomen Inspection:-Inspeection Normal. Palpation/Percussion:Note:No mass. Palpation and Percussion of the abdomen reveal- Non Tender, Non Distended + BS, no rebound or guarding.   Neurologic Cranial Nerve exam:- CN III-XII intact(No nystagmus), symmetric smile. Strength:- 5/5 equal and symmetric strength both upper and lower extremities.       Assessment & Plan:  For you wellness exam today I have ordered cbc, cmp and  lipid panel.  Vaccine given today. Shingrix today and flu vaccine.  Call your GI MD for repeat colonoscopy that is due.  Recommend exercise and healthy diet.  We will let you know lab results as they come in.  Follow up date appointment will be determined after lab review.     Type 2 Diabetes Mellitus A1c previously 5.4 on Ozempic 0.25mg  weekly and Metformin 500mg  BID. No side effects from Ozempic  except constipation. -Check A1c with labs. -Consider increasing Ozempic to 0.5mg  weekly if A1c is  elevated. -Continue Metformin 500mg  BID. -Advise high fiber diet and hydration for constipation. Consider use of MiraLAX as needed.  Hypertension Home blood pressure readings of 130/79. Currently on Amlodipine 10mg  daily. -Continue Amlodipine 10mg  daily. -Continue home blood pressure monitoring.  Hyperlipidemia On Atorvastatin 20mg  daily. -Continue Atorvastatin 20mg  daily.  Ear Discharge Daily discharge from right ear since 2020. No pain or other symptoms. -Advise daily ear cleaning with caution to avoid deep insertion of Q-tip.  General Health Maintenance -Check PSA with labs. -Advise patient to schedule colonoscopy due this year. -Administer first dose of Shingrix and flu vaccine today. -Plan for pneumonia vaccine in the future.  [address] charge as addressed chronic problems and ear complaint.

## 2024-01-12 NOTE — Addendum Note (Signed)
 Addended by: Thelma Barge D on: 01/12/2024 02:51 PM   Modules accepted: Orders

## 2024-01-13 ENCOUNTER — Encounter: Payer: Self-pay | Admitting: Medical

## 2024-01-13 LAB — CBC WITH DIFFERENTIAL/PLATELET
Absolute Lymphocytes: 2790 {cells}/uL (ref 850–3900)
Absolute Monocytes: 632 {cells}/uL (ref 200–950)
Basophils Absolute: 58 {cells}/uL (ref 0–200)
Basophils Relative: 1 %
Eosinophils Absolute: 122 {cells}/uL (ref 15–500)
Eosinophils Relative: 2.1 %
HCT: 42.6 % (ref 38.5–50.0)
Hemoglobin: 14.6 g/dL (ref 13.2–17.1)
MCH: 32.2 pg (ref 27.0–33.0)
MCHC: 34.3 g/dL (ref 32.0–36.0)
MCV: 93.8 fL (ref 80.0–100.0)
MPV: 10.1 fL (ref 7.5–12.5)
Monocytes Relative: 10.9 %
Neutro Abs: 2198 {cells}/uL (ref 1500–7800)
Neutrophils Relative %: 37.9 %
Platelets: 321 10*3/uL (ref 140–400)
RBC: 4.54 10*6/uL (ref 4.20–5.80)
RDW: 11.8 % (ref 11.0–15.0)
Total Lymphocyte: 48.1 %
WBC: 5.8 10*3/uL (ref 3.8–10.8)

## 2024-01-13 LAB — COMPREHENSIVE METABOLIC PANEL
AG Ratio: 1.4 (calc) (ref 1.0–2.5)
ALT: 39 U/L (ref 9–46)
AST: 25 U/L (ref 10–35)
Albumin: 4.7 g/dL (ref 3.6–5.1)
Alkaline phosphatase (APISO): 54 U/L (ref 35–144)
BUN/Creatinine Ratio: 17 (calc) (ref 6–22)
BUN: 24 mg/dL (ref 7–25)
CO2: 25 mmol/L (ref 20–32)
Calcium: 10.2 mg/dL (ref 8.6–10.3)
Chloride: 98 mmol/L (ref 98–110)
Creat: 1.45 mg/dL — ABNORMAL HIGH (ref 0.70–1.35)
Globulin: 3.4 g/dL (ref 1.9–3.7)
Glucose, Bld: 128 mg/dL — ABNORMAL HIGH (ref 65–99)
Potassium: 4.3 mmol/L (ref 3.5–5.3)
Sodium: 135 mmol/L (ref 135–146)
Total Bilirubin: 0.7 mg/dL (ref 0.2–1.2)
Total Protein: 8.1 g/dL (ref 6.1–8.1)

## 2024-01-13 LAB — LIPID PANEL
Cholesterol: 155 mg/dL (ref <200)
HDL: 39 mg/dL — ABNORMAL LOW (ref >=40)
LDL Cholesterol (Calc): 94 mg/dL
Non-HDL Cholesterol (Calc): 116 mg/dL (ref <130)
Total CHOL/HDL Ratio: 4 (calc) (ref <5.0)
Triglycerides: 122 mg/dL (ref <150)

## 2024-01-13 LAB — PSA: PSA: 1.54 ng/mL (ref <=4.00)

## 2024-01-13 LAB — HEMOGLOBIN A1C
Hgb A1c MFr Bld: 7.5 %{Hb} — ABNORMAL HIGH (ref <5.7)
Mean Plasma Glucose: 169 mg/dL
eAG (mmol/L): 9.3 mmol/L

## 2024-01-13 MED ORDER — OZEMPIC (0.25 OR 0.5 MG/DOSE) 2 MG/3ML ~~LOC~~ SOPN
0.5000 mg | PEN_INJECTOR | SUBCUTANEOUS | 3 refills | Status: DC
Start: 2024-01-13 — End: 2024-05-03
  Filled 2024-01-13: qty 3, 28d supply, fill #0
  Filled 2024-02-07 – 2024-02-14 (×2): qty 3, 28d supply, fill #1
  Filled 2024-03-08: qty 3, 28d supply, fill #2
  Filled 2024-04-10: qty 3, 28d supply, fill #3

## 2024-01-13 NOTE — Addendum Note (Signed)
 Addended by: Gwenevere Abbot on: 01/13/2024 10:23 AM   Modules accepted: Orders

## 2024-01-15 ENCOUNTER — Encounter: Payer: Self-pay | Admitting: Internal Medicine

## 2024-01-15 ENCOUNTER — Other Ambulatory Visit (HOSPITAL_BASED_OUTPATIENT_CLINIC_OR_DEPARTMENT_OTHER): Payer: Self-pay

## 2024-01-17 ENCOUNTER — Other Ambulatory Visit: Payer: Self-pay | Admitting: Physician Assistant

## 2024-01-17 MED ORDER — DOCUSATE SODIUM 100 MG PO CAPS
100.0000 mg | ORAL_CAPSULE | Freq: Every day | ORAL | 2 refills | Status: DC | PRN
Start: 2024-01-17 — End: 2024-02-07

## 2024-01-17 MED ORDER — ONDANSETRON HCL 4 MG PO TABS: 4.0000 mg | ORAL_TABLET | Freq: Three times a day (TID) | ORAL | 0 refills | Status: DC | PRN

## 2024-01-17 MED ORDER — DOXYCYCLINE HYCLATE 100 MG PO CAPS: 100.0000 mg | ORAL_CAPSULE | Freq: Two times a day (BID) | ORAL | 0 refills | Status: DC

## 2024-01-17 MED ORDER — OXYCODONE-ACETAMINOPHEN 5-325 MG PO TABS: 1.0000 | ORAL_TABLET | Freq: Four times a day (QID) | ORAL | 0 refills | Status: DC | PRN

## 2024-01-17 MED ORDER — METHOCARBAMOL 750 MG PO TABS: 750.0000 mg | ORAL_TABLET | Freq: Three times a day (TID) | ORAL | 0 refills | Status: DC | PRN

## 2024-01-18 ENCOUNTER — Telehealth: Payer: Self-pay | Admitting: Orthopaedic Surgery

## 2024-01-18 NOTE — Telephone Encounter (Signed)
 Patient calling wanting to know why the pain prescription that was called in for the right total knee is oxyCODONE-acetaminophen (PERCOCET) 5-325 MG tablet [[id number]] and the pain prescription for the left total knee (last year) was oxyCODONE-acetaminophen (PERCOCET) 7.5-325 MG tablet [[id number]] .  He is requesting the stronger prescription.  Please call patient if there is an issue with this request.  [phone]

## 2024-01-19 ENCOUNTER — Other Ambulatory Visit: Payer: Self-pay | Admitting: Physician Assistant

## 2024-01-19 NOTE — Telephone Encounter (Signed)
 Because we always start with oxy 5 as this is what we recommend.  He was initially prescribed oxy 5

## 2024-01-19 NOTE — Telephone Encounter (Signed)
 Called patient no answer. LMOM to return our call. Just need to advise on message below.

## 2024-01-23 NOTE — Pre-Procedure Instructions (Signed)
 Surgical Instructions   Your procedure is scheduled on Monday, March 17th. Report to [facility] Main Entrance "A" at 06:15 A.M., then check in with the Admitting office. Any questions or running late day of surgery: call [phone]  Questions prior to your surgery date: call [phone], Monday-Friday, 8am-4pm. If you experience any cold or flu symptoms such as cough, fever, chills, shortness of breath, etc. between now and your scheduled surgery, please notify us at the above number.     Remember:  Do not eat after midnight the night before your surgery  You may drink clear liquids until 05:45 AM the morning of your surgery.   Clear liquids allowed are: Water, Non-Citrus Juices (without pulp), Carbonated Beverages, Clear Tea (no milk, honey, etc.), Black Coffee Only (NO MILK, CREAM OR POWDERED CREAMER of any kind), and Gatorade.  Patient Instructions  The night before surgery:  No food after midnight. ONLY clear liquids after midnight   The day of surgery (if you have diabetes): Drink ONE (1) 12 oz G2 given to you in your pre admission testing appointment by 05:45 AM the morning of surgery. Drink in one sitting. Do not sip.  This drink was given to you during your hospital  pre-op appointment visit.  Nothing else to drink after completing the  12 oz bottle of G2.         If you have questions, please contact your surgeon's office.    Take these medicines the morning of surgery with A SIP OF WATER  amLODipine (NORVASC)  atorvastatin (LIPITOR)  buPROPion (WELLBUTRIN XL)    May take these medicines IF NEEDED: albuterol (VENTOLIN HFA)- bring inhaler with you Brimonidine Tartrate (LUMIFY) eye drops methocarbamol (ROBAXIN-750)  ondansetron (ZOFRAN)  oxyCODONE-acetaminophen (PERCOCET)  traMADol (ULTRAM)    One week prior to surgery, STOP taking any Aspirin (unless otherwise instructed by your surgeon) Aleve, Naproxen, Ibuprofen, Motrin, Advil, Goody's, BC's, all herbal  medications, fish oil, and non-prescription vitamins.  WHAT DO I DO ABOUT MY DIABETES MEDICATION?   Do not take metFORMIN (GLUCOPHAGE) the morning of surgery.  Stop taking Semaglutide 7 days prior to surgery. Last dose 3/9.      If your CBG is greater than 220 mg/dL, you may take  of your sliding scale (correction) dose of insulin lispro (HUMALOG KWIKPEN).   HOW TO MANAGE YOUR DIABETES BEFORE AND AFTER SURGERY  Why is it important to control my blood sugar before and after surgery? Improving blood sugar levels before and after surgery helps healing and can limit problems. A way of improving blood sugar control is eating a healthy diet by:  Eating less sugar and carbohydrates  Increasing activity/exercise  Talking with your doctor about reaching your blood sugar goals High blood sugars (greater than 180 mg/dL) can raise your risk of infections and slow your recovery, so you will need to focus on controlling your diabetes during the weeks before surgery. Make sure that the doctor who takes care of your diabetes knows about your planned surgery including the date and location.  How do I manage my blood sugar before surgery? Check your blood sugar at least 4 times a day, starting 2 days before surgery, to make sure that the level is not too high or low.  Check your blood sugar the morning of your surgery when you wake up and every 2 hours until you get to the Short Stay unit.  If your blood sugar is less than 70 mg/dL, you will need to treat for  low blood sugar: Do not take insulin. Treat a low blood sugar (less than 70 mg/dL) with  cup of clear juice (cranberry or apple), 4 glucose tablets, OR glucose gel. Recheck blood sugar in 15 minutes after treatment (to make sure it is greater than 70 mg/dL). If your blood sugar is not greater than 70 mg/dL on recheck, call [phone] for further instructions. Report your blood sugar to the short stay nurse when you get to Short Stay.  If  you are admitted to the hospital after surgery: Your blood sugar will be checked by the staff and you will probably be given insulin after surgery (instead of oral diabetes medicines) to make sure you have good blood sugar levels. The goal for blood sugar control after surgery is 80-180 mg/dL.                     Do NOT Smoke (Tobacco/Vaping) for 24 hours prior to your procedure.  If you use a CPAP at night, you may bring your mask/headgear for your overnight stay.   You will be asked to remove any contacts, glasses, piercing's, hearing aid's, dentures/partials prior to surgery. Please bring cases for these items if needed.    Patients discharged the day of surgery will not be allowed to drive home, and someone needs to stay with them for 24 hours.  SURGICAL WAITING ROOM VISITATION Patients may have no more than 2 support people in the waiting area - these visitors may rotate.   Pre-op nurse will coordinate an appropriate time for 1 ADULT support person, who may not rotate, to accompany patient in pre-op.  Children under the age of 33 must have an adult with them who is not the patient and must remain in the main waiting area with an adult.  If the patient needs to stay at the hospital during part of their recovery, the visitor guidelines for inpatient rooms apply.  Please refer to the [facility] website for the visitor guidelines for any additional information.   If you received a COVID test during your pre-op visit  it is requested that you wear a mask when out in public, stay away from anyone that may not be feeling well and notify your surgeon if you develop symptoms. If you have been in contact with anyone that has tested positive in the last 10 days please notify you surgeon.      Pre-operative 5 CHG Bathing Instructions   You can play a key role in reducing the risk of infection after surgery. Your skin needs to be as free of germs as possible. You can reduce the number of germs  on your skin by washing with CHG (chlorhexidine gluconate) soap before surgery. CHG is an antiseptic soap that kills germs and continues to kill germs even after washing.   DO NOT use if you have an allergy to chlorhexidine/CHG or antibacterial soaps. If your skin becomes reddened or irritated, stop using the CHG and notify one of our RNs at [phone].   Please shower with the CHG soap starting 4 days before surgery using the following schedule:     Please keep in mind the following:  DO NOT shave, including legs and underarms, starting the day of your first shower.   You may shave your face at any point before/day of surgery.  Place clean sheets on your bed the day you start using CHG soap. Use a clean washcloth (not used since being washed) for each shower. DO  NOT sleep with pets once you start using the CHG.   CHG Shower Instructions:  Wash your face and private area with normal soap. If you choose to wash your hair, wash first with your normal shampoo.  After you use shampoo/soap, rinse your hair and body thoroughly to remove shampoo/soap residue.  Turn the water OFF and apply about 3 tablespoons (45 ml) of CHG soap to a CLEAN washcloth.  Apply CHG soap ONLY FROM YOUR NECK DOWN TO YOUR TOES (washing for 3-5 minutes)  DO NOT use CHG soap on face, private areas, open wounds, or sores.  Pay special attention to the area where your surgery is being performed.  If you are having back surgery, having someone wash your back for you may be helpful. Wait 2 minutes after CHG soap is applied, then you may rinse off the CHG soap.  Pat dry with a clean towel  Put on clean clothes/pajamas   If you choose to wear lotion, please use ONLY the CHG-compatible lotions that are listed below.  Additional instructions for the day of surgery: DO NOT APPLY any lotions, deodorants, cologne, or perfumes.   Do not bring valuables to the hospital. [facility] is not responsible for any  belongings/valuables. Do not wear nail polish, gel polish, artificial nails, or any other type of covering on natural nails (fingers and toes) Do not wear jewelry or makeup Put on clean/comfortable clothes.  Please brush your teeth.  Ask your nurse before applying any prescription medications to the skin.     CHG Compatible Lotions   Aveeno Moisturizing lotion  Cetaphil Moisturizing Cream  Cetaphil Moisturizing Lotion  Clairol Herbal Essence Moisturizing Lotion, Dry Skin  Clairol Herbal Essence Moisturizing Lotion, Extra Dry Skin  Clairol Herbal Essence Moisturizing Lotion, Normal Skin  Curel Age Defying Therapeutic Moisturizing Lotion with Alpha Hydroxy  Curel Extreme Care Body Lotion  Curel Soothing Hands Moisturizing Hand Lotion  Curel Therapeutic Moisturizing Cream, Fragrance-Free  Curel Therapeutic Moisturizing Lotion, Fragrance-Free  Curel Therapeutic Moisturizing Lotion, Original Formula  Eucerin Daily Replenishing Lotion  Eucerin Dry Skin Therapy Plus Alpha Hydroxy Crme  Eucerin Dry Skin Therapy Plus Alpha Hydroxy Lotion  Eucerin Original Crme  Eucerin Original Lotion  Eucerin Plus Crme Eucerin Plus Lotion  Eucerin TriLipid Replenishing Lotion  Keri Anti-Bacterial Hand Lotion  Keri Deep Conditioning Original Lotion Dry Skin Formula Softly Scented  Keri Deep Conditioning Original Lotion, Fragrance Free Sensitive Skin Formula  Keri Lotion Fast Absorbing Fragrance Free Sensitive Skin Formula  Keri Lotion Fast Absorbing Softly Scented Dry Skin Formula  Keri Original Lotion  Keri Skin Renewal Lotion Keri Silky Smooth Lotion  Keri Silky Smooth Sensitive Skin Lotion  Nivea Body Creamy Conditioning Oil  Nivea Body Extra Enriched Lotion  Nivea Body Original Lotion  Nivea Body Sheer Moisturizing Lotion Nivea Crme  Nivea Skin Firming Lotion  NutraDerm 30 Skin Lotion  NutraDerm Skin Lotion  NutraDerm Therapeutic Skin Cream  NutraDerm Therapeutic Skin Lotion  ProShield  Protective Hand Cream  Provon moisturizing lotion  Please read over the following fact sheets that you were given.

## 2024-01-24 ENCOUNTER — Encounter (HOSPITAL_COMMUNITY)
Admission: RE | Admit: 2024-01-24 | Discharge: 2024-01-24 | Disposition: A | Discharge status: Home / Self Care | Source: Ambulatory Visit | Attending: Orthopaedic Surgery | Admitting: Orthopaedic Surgery

## 2024-01-24 ENCOUNTER — Encounter (HOSPITAL_COMMUNITY): Payer: Self-pay

## 2024-01-24 ENCOUNTER — Other Ambulatory Visit: Payer: Self-pay

## 2024-01-24 VITALS — BP 133/90 | HR 98 | Temp 97.9°F | Resp 18 | Ht 67.0 in | Wt 190.4 lb

## 2024-01-24 DIAGNOSIS — Z01818 Encounter for other preprocedural examination: Secondary | ICD-10-CM

## 2024-01-24 DIAGNOSIS — R9431 Abnormal electrocardiogram [ECG] [EKG]: Secondary | ICD-10-CM | POA: Diagnosis not present

## 2024-01-24 DIAGNOSIS — I10 Essential (primary) hypertension: Secondary | ICD-10-CM

## 2024-01-24 LAB — CBC
HCT: 42.9 % (ref 39.0–52.0)
Hemoglobin: 15.1 g/dL (ref 13.0–17.0)
MCH: 32 pg (ref 26.0–34.0)
MCHC: 35.2 g/dL (ref 30.0–36.0)
MCV: 90.9 fL (ref 80.0–100.0)
Platelets: 372 10*3/uL (ref 150–400)
RBC: 4.72 MIL/uL (ref 4.22–5.81)
RDW: 11.9 % (ref 11.5–15.5)
WBC: 6.4 10*3/uL (ref 4.0–10.5)
nRBC: 0 % (ref 0.0–0.2)

## 2024-01-24 LAB — BASIC METABOLIC PANEL
Anion gap: 9 (ref 5–15)
BUN: 19 mg/dL (ref 8–23)
CO2: 25 mmol/L (ref 22–32)
Calcium: 9.4 mg/dL (ref 8.9–10.3)
Chloride: 100 mmol/L (ref 98–111)
Creatinine, Ser: 1.55 mg/dL — ABNORMAL HIGH (ref 0.61–1.24)
GFR, Estimated: 50 mL/min — ABNORMAL LOW (ref >60)
Glucose, Bld: 159 mg/dL — ABNORMAL HIGH (ref 70–99)
Potassium: 3.9 mmol/L (ref 3.5–5.1)
Sodium: 134 mmol/L — ABNORMAL LOW (ref 135–145)

## 2024-01-24 LAB — SURGICAL PCR SCREEN
MRSA, PCR: NEGATIVE
Staphylococcus aureus: NEGATIVE

## 2024-01-24 LAB — GLUCOSE, CAPILLARY: Glucose-Capillary: 152 mg/dL — ABNORMAL HIGH (ref 70–99)

## 2024-01-24 NOTE — Progress Notes (Signed)
 PCP -  Saguier, Ramon Dredge, PA-C  Cardiologist -   PPM/ICD - denies Device Orders - n/a Rep Notified - n/a  Chest x-ray - denies EKG - 01-24-24 Stress Test - 03-15-16 ECHO - 03-16-16 Cardiac Cath -   Sleep Study - denies CPAP - n/a  Fasting Blood Sugar - Per patient fasting blood sugars range from 110-160. Blood sugar at PAT appointment 132 fasting per patient. Checks Blood Sugar two times a day  Last dose of GLP1 agonist-  OZEMPIC  GLP1 instructions: Last dose 01-21-24  Blood Thinner Instructions: denies Aspirin Instructions:n/a  ERAS Protcol - clear liquids until 5:45 am  PRE-SURGERY  G2-   COVID TEST- n/a   Anesthesia review: yes  Patient denies shortness of breath, fever, cough and chest pain at PAT appointment   All instructions explained to the patient, with a verbal understanding of the material. Patient agrees to go over the instructions while at home for a better understanding. Patient also instructed to self quarantine after being tested for COVID-19. The opportunity to ask questions was provided.

## 2024-01-26 MED ORDER — TRANEXAMIC ACID 1000 MG/10ML IV SOLN
2000.0000 mg | INTRAVENOUS | Status: AC
  Filled 2024-01-26: qty 20

## 2024-01-29 ENCOUNTER — Observation Stay (HOSPITAL_COMMUNITY)
Admission: RE | Admit: 2024-01-29 | Discharge: 2024-01-30 | Disposition: A | Discharge status: Home / Self Care | Payer: Managed Care, Other (non HMO) | Source: Ambulatory Visit | Attending: Orthopaedic Surgery | Admitting: Orthopaedic Surgery

## 2024-01-29 ENCOUNTER — Encounter (HOSPITAL_COMMUNITY): Payer: Self-pay | Admitting: Orthopaedic Surgery

## 2024-01-29 ENCOUNTER — Other Ambulatory Visit: Payer: Self-pay

## 2024-01-29 ENCOUNTER — Ambulatory Visit (HOSPITAL_COMMUNITY)

## 2024-01-29 ENCOUNTER — Observation Stay (HOSPITAL_COMMUNITY)

## 2024-01-29 ENCOUNTER — Ambulatory Visit (HOSPITAL_COMMUNITY): Payer: Self-pay | Admitting: Physician Assistant

## 2024-01-29 ENCOUNTER — Encounter (HOSPITAL_COMMUNITY)
Admission: RE | Disposition: A | Discharge status: Home / Self Care | Payer: Self-pay | Source: Ambulatory Visit | Attending: Orthopaedic Surgery

## 2024-01-29 ENCOUNTER — Other Ambulatory Visit (HOSPITAL_COMMUNITY): Payer: Self-pay

## 2024-01-29 ENCOUNTER — Other Ambulatory Visit: Payer: Self-pay | Admitting: Physician Assistant

## 2024-01-29 DIAGNOSIS — Z96652 Presence of left artificial knee joint: Secondary | ICD-10-CM | POA: Insufficient documentation

## 2024-01-29 DIAGNOSIS — Z96651 Presence of right artificial knee joint: Secondary | ICD-10-CM

## 2024-01-29 DIAGNOSIS — J45909 Unspecified asthma, uncomplicated: Secondary | ICD-10-CM | POA: Diagnosis not present

## 2024-01-29 DIAGNOSIS — Z7984 Long term (current) use of oral hypoglycemic drugs: Secondary | ICD-10-CM | POA: Diagnosis not present

## 2024-01-29 DIAGNOSIS — I10 Essential (primary) hypertension: Secondary | ICD-10-CM | POA: Insufficient documentation

## 2024-01-29 DIAGNOSIS — M1711 Unilateral primary osteoarthritis, right knee: Secondary | ICD-10-CM

## 2024-01-29 DIAGNOSIS — E119 Type 2 diabetes mellitus without complications: Secondary | ICD-10-CM | POA: Insufficient documentation

## 2024-01-29 DIAGNOSIS — Z794 Long term (current) use of insulin: Secondary | ICD-10-CM | POA: Diagnosis not present

## 2024-01-29 DIAGNOSIS — Z87891 Personal history of nicotine dependence: Secondary | ICD-10-CM | POA: Insufficient documentation

## 2024-01-29 DIAGNOSIS — Z79899 Other long term (current) drug therapy: Secondary | ICD-10-CM | POA: Insufficient documentation

## 2024-01-29 HISTORY — PX: TOTAL KNEE ARTHROPLASTY: SHX125

## 2024-01-29 LAB — GLUCOSE, CAPILLARY
Glucose-Capillary: 140 mg/dL — ABNORMAL HIGH (ref 70–99)
Glucose-Capillary: 103 mg/dL — ABNORMAL HIGH (ref 70–99)
Glucose-Capillary: 150 mg/dL — ABNORMAL HIGH (ref 70–99)
Glucose-Capillary: 180 mg/dL — ABNORMAL HIGH (ref 70–99)

## 2024-01-29 SURGERY — ARTHROPLASTY, KNEE, TOTAL
Anesthesia: Spinal | Site: Knee | Laterality: Right

## 2024-01-29 MED ORDER — PHENYLEPHRINE HCL-NACL 20-0.9 MG/250ML-% IV SOLN
INTRAVENOUS | Status: DC | PRN
  Administered 2024-01-29: 30 ug/min via INTRAVENOUS

## 2024-01-29 MED ORDER — DOXYCYCLINE HYCLATE 100 MG PO TABS
100.0000 mg | ORAL_TABLET | Freq: Two times a day (BID) | ORAL | Status: DC
  Administered 2024-01-29 – 2024-01-30 (×3): 100 mg via ORAL
  Filled 2024-01-29 (×3): qty 1

## 2024-01-29 MED ORDER — PROPOFOL 500 MG/50ML IV EMUL
INTRAVENOUS | Status: DC | PRN
  Administered 2024-01-29: 100 ug/kg/min via INTRAVENOUS

## 2024-01-29 MED ORDER — PRONTOSAN WOUND IRRIGATION OPTIME
TOPICAL | Status: DC | PRN
  Administered 2024-01-29: 350 mL

## 2024-01-29 MED ORDER — MIDAZOLAM HCL 2 MG/2ML IJ SOLN: 2.0000 mg | Freq: Once | INTRAMUSCULAR | Status: AC

## 2024-01-29 MED ORDER — ONDANSETRON HCL 4 MG/2ML IJ SOLN
INTRAMUSCULAR | Status: DC | PRN
  Administered 2024-01-29: 4 mg via INTRAVENOUS

## 2024-01-29 MED ORDER — DROPERIDOL 2.5 MG/ML IJ SOLN: 0.6250 mg | Freq: Once | INTRAMUSCULAR | Status: DC | PRN

## 2024-01-29 MED ORDER — TRANEXAMIC ACID-NACL 1000-0.7 MG/100ML-% IV SOLN
1000.0000 mg | INTRAVENOUS | Status: AC
  Administered 2024-01-29: 1000 mg via INTRAVENOUS

## 2024-01-29 MED ORDER — ONDANSETRON HCL 4 MG/2ML IJ SOLN
4.0000 mg | Freq: Four times a day (QID) | INTRAMUSCULAR | Status: DC | PRN
  Administered 2024-01-30: 4 mg via INTRAVENOUS
  Filled 2024-01-29: qty 2

## 2024-01-29 MED ORDER — DEXAMETHASONE SODIUM PHOSPHATE 10 MG/ML IJ SOLN
10.0000 mg | Freq: Once | INTRAMUSCULAR | Status: AC
  Administered 2024-01-30: 10 mg via INTRAVENOUS
  Filled 2024-01-29: qty 1

## 2024-01-29 MED ORDER — VANCOMYCIN HCL 1000 MG IV SOLR
INTRAVENOUS | Status: AC
  Filled 2024-01-29: qty 20

## 2024-01-29 MED ORDER — BUPIVACAINE-MELOXICAM ER 400-12 MG/14ML IJ SOLN
INTRAMUSCULAR | Status: AC
  Filled 2024-01-29: qty 1

## 2024-01-29 MED ORDER — TRANEXAMIC ACID-NACL 1000-0.7 MG/100ML-% IV SOLN
INTRAVENOUS | Status: AC
  Filled 2024-01-29: qty 100

## 2024-01-29 MED ORDER — ACETAMINOPHEN 500 MG PO TABS
1000.0000 mg | ORAL_TABLET | Freq: Four times a day (QID) | ORAL | Status: AC
  Administered 2024-01-29 – 2024-01-30 (×4): 1000 mg via ORAL
  Filled 2024-01-29 (×4): qty 2

## 2024-01-29 MED ORDER — FENTANYL CITRATE (PF) 100 MCG/2ML IJ SOLN: 50.0000 ug | Freq: Once | INTRAMUSCULAR | Status: AC

## 2024-01-29 MED ORDER — FENTANYL CITRATE (PF) 100 MCG/2ML IJ SOLN
INTRAMUSCULAR | Status: AC
  Administered 2024-01-29: 50 ug via INTRAVENOUS
  Filled 2024-01-29: qty 2

## 2024-01-29 MED ORDER — LACTATED RINGERS IV SOLN: INTRAVENOUS | Status: DC | PRN

## 2024-01-29 MED ORDER — PHENYLEPHRINE 80 MCG/ML (10ML) SYRINGE FOR IV PUSH (FOR BLOOD PRESSURE SUPPORT)
PREFILLED_SYRINGE | INTRAVENOUS | Status: DC | PRN
  Administered 2024-01-29 (×2): 80 ug via INTRAVENOUS
  Administered 2024-01-29 (×2): 160 ug via INTRAVENOUS
  Administered 2024-01-29 (×3): 80 ug via INTRAVENOUS

## 2024-01-29 MED ORDER — MENTHOL 3 MG MT LOZG: 1.0000 | LOZENGE | OROMUCOSAL | Status: DC | PRN

## 2024-01-29 MED ORDER — HYDROMORPHONE HCL 1 MG/ML IJ SOLN: 0.2500 mg | INTRAMUSCULAR | Status: DC | PRN

## 2024-01-29 MED ORDER — ACETAMINOPHEN 160 MG/5ML PO SOLN: 325.0000 mg | Freq: Once | ORAL | Status: DC | PRN

## 2024-01-29 MED ORDER — ORAL CARE MOUTH RINSE: 15.0000 mL | Freq: Once | OROMUCOSAL | Status: AC

## 2024-01-29 MED ORDER — OXYCODONE HCL ER 10 MG PO T12A
10.0000 mg | EXTENDED_RELEASE_TABLET | Freq: Two times a day (BID) | ORAL | Status: DC
  Administered 2024-01-29 (×2): 10 mg via ORAL
  Filled 2024-01-29 (×2): qty 1

## 2024-01-29 MED ORDER — POVIDONE-IODINE 10 % EX SWAB
2.0000 | Freq: Once | CUTANEOUS | Status: AC
  Administered 2024-01-29: 2 via TOPICAL

## 2024-01-29 MED ORDER — INSULIN LISPRO (1 UNIT DIAL) 100 UNIT/ML (KWIKPEN): 4.0000 [IU] | PEN_INJECTOR | SUBCUTANEOUS | Status: DC | PRN

## 2024-01-29 MED ORDER — ACETAMINOPHEN 10 MG/ML IV SOLN: 1000.0000 mg | Freq: Once | INTRAVENOUS | Status: DC | PRN

## 2024-01-29 MED ORDER — HYDROMORPHONE HCL 1 MG/ML IJ SOLN: 0.5000 mg | INTRAMUSCULAR | Status: DC | PRN

## 2024-01-29 MED ORDER — METOCLOPRAMIDE HCL 5 MG PO TABS: 5.0000 mg | ORAL_TABLET | Freq: Three times a day (TID) | ORAL | Status: DC | PRN

## 2024-01-29 MED ORDER — FENTANYL CITRATE (PF) 250 MCG/5ML IJ SOLN
INTRAMUSCULAR | Status: AC
  Filled 2024-01-29: qty 5

## 2024-01-29 MED ORDER — CHLORHEXIDINE GLUCONATE 0.12 % MT SOLN
OROMUCOSAL | Status: AC
  Administered 2024-01-29: 15 mL via OROMUCOSAL
  Filled 2024-01-29: qty 15

## 2024-01-29 MED ORDER — LACTATED RINGERS IV SOLN: INTRAVENOUS | Status: DC

## 2024-01-29 MED ORDER — ACETAMINOPHEN 325 MG PO TABS: 325.0000 mg | ORAL_TABLET | Freq: Once | ORAL | Status: DC | PRN

## 2024-01-29 MED ORDER — MIDAZOLAM HCL 2 MG/2ML IJ SOLN
INTRAMUSCULAR | Status: AC
  Filled 2024-01-29: qty 2

## 2024-01-29 MED ORDER — STERILE WATER FOR IRRIGATION IR SOLN
Status: DC | PRN
  Administered 2024-01-29: 1000 mL

## 2024-01-29 MED ORDER — INSULIN ASPART 100 UNIT/ML IJ SOLN
0.0000 [IU] | Freq: Three times a day (TID) | INTRAMUSCULAR | Status: DC
  Administered 2024-01-29 – 2024-01-30 (×2): 2 [IU] via SUBCUTANEOUS

## 2024-01-29 MED ORDER — ACETAMINOPHEN 325 MG PO TABS: 325.0000 mg | ORAL_TABLET | Freq: Four times a day (QID) | ORAL | Status: DC | PRN

## 2024-01-29 MED ORDER — PROPOFOL 10 MG/ML IV BOLUS
INTRAVENOUS | Status: AC
  Filled 2024-01-29: qty 20

## 2024-01-29 MED ORDER — TRIAMTERENE-HCTZ 37.5-25 MG PO TABS
1.0000 | ORAL_TABLET | Freq: Every morning | ORAL | Status: DC
  Administered 2024-01-29 – 2024-01-30 (×2): 1 via ORAL
  Filled 2024-01-29 (×2): qty 1

## 2024-01-29 MED ORDER — SODIUM CHLORIDE 0.9 % IR SOLN
Status: DC | PRN
  Administered 2024-01-29: 1000 mL

## 2024-01-29 MED ORDER — METOCLOPRAMIDE HCL 5 MG/ML IJ SOLN: 5.0000 mg | Freq: Three times a day (TID) | INTRAMUSCULAR | Status: DC | PRN

## 2024-01-29 MED ORDER — MIDAZOLAM HCL 2 MG/2ML IJ SOLN
INTRAMUSCULAR | Status: AC
Start: 2024-01-29 — End: 2024-01-29
  Administered 2024-01-29: 2 mg via INTRAVENOUS
  Filled 2024-01-29: qty 2

## 2024-01-29 MED ORDER — DOCUSATE SODIUM 100 MG PO CAPS
100.0000 mg | ORAL_CAPSULE | Freq: Two times a day (BID) | ORAL | Status: DC
  Administered 2024-01-29 – 2024-01-30 (×2): 100 mg via ORAL
  Filled 2024-01-29 (×3): qty 1

## 2024-01-29 MED ORDER — ROPIVACAINE HCL 5 MG/ML IJ SOLN
INTRAMUSCULAR | Status: DC | PRN
Start: 2024-01-29 — End: 2024-01-29
  Administered 2024-01-29: 30 mL via PERINEURAL

## 2024-01-29 MED ORDER — BUPIVACAINE IN DEXTROSE 0.75-8.25 % IT SOLN
INTRATHECAL | Status: DC | PRN
  Administered 2024-01-29: 1.8 mL via INTRATHECAL

## 2024-01-29 MED ORDER — INSULIN ASPART 100 UNIT/ML IJ SOLN: 0.0000 [IU] | Freq: Every day | INTRAMUSCULAR | Status: DC

## 2024-01-29 MED ORDER — ONDANSETRON HCL 4 MG PO TABS: 4.0000 mg | ORAL_TABLET | Freq: Four times a day (QID) | ORAL | Status: DC | PRN

## 2024-01-29 MED ORDER — BUPIVACAINE-MELOXICAM ER 400-12 MG/14ML IJ SOLN
INTRAMUSCULAR | Status: DC | PRN
  Administered 2024-01-29: 400 mg

## 2024-01-29 MED ORDER — CHLORHEXIDINE GLUCONATE 0.12 % MT SOLN: 15.0000 mL | Freq: Once | OROMUCOSAL | Status: AC

## 2024-01-29 MED ORDER — CEFAZOLIN SODIUM-DEXTROSE 2-4 GM/100ML-% IV SOLN
2.0000 g | Freq: Four times a day (QID) | INTRAVENOUS | Status: AC
  Administered 2024-01-29 (×2): 2 g via INTRAVENOUS
  Filled 2024-01-29 (×2): qty 100

## 2024-01-29 MED ORDER — KETOROLAC TROMETHAMINE 15 MG/ML IJ SOLN
15.0000 mg | Freq: Four times a day (QID) | INTRAMUSCULAR | Status: AC
  Administered 2024-01-29 – 2024-01-30 (×4): 15 mg via INTRAVENOUS
  Filled 2024-01-29 (×4): qty 1

## 2024-01-29 MED ORDER — TRANEXAMIC ACID-NACL 1000-0.7 MG/100ML-% IV SOLN
1000.0000 mg | Freq: Once | INTRAVENOUS | Status: AC
  Administered 2024-01-29: 1000 mg via INTRAVENOUS
  Filled 2024-01-29: qty 100

## 2024-01-29 MED ORDER — CEFAZOLIN SODIUM-DEXTROSE 2-4 GM/100ML-% IV SOLN
2.0000 g | INTRAVENOUS | Status: AC
  Administered 2024-01-29: 2 g via INTRAVENOUS

## 2024-01-29 MED ORDER — VANCOMYCIN HCL 1000 MG IV SOLR
INTRAVENOUS | Status: DC | PRN
  Administered 2024-01-29: 1000 mg via TOPICAL

## 2024-01-29 MED ORDER — METHOCARBAMOL 500 MG PO TABS
500.0000 mg | ORAL_TABLET | Freq: Four times a day (QID) | ORAL | Status: DC | PRN
  Filled 2024-01-29: qty 1

## 2024-01-29 MED ORDER — OXYCODONE HCL 5 MG PO TABS
10.0000 mg | ORAL_TABLET | ORAL | Status: DC | PRN
  Administered 2024-01-29 – 2024-01-30 (×3): 15 mg via ORAL
  Filled 2024-01-29 (×5): qty 3

## 2024-01-29 MED ORDER — CEFAZOLIN SODIUM-DEXTROSE 2-4 GM/100ML-% IV SOLN
INTRAVENOUS | Status: AC
  Filled 2024-01-29: qty 100

## 2024-01-29 MED ORDER — APIXABAN 2.5 MG PO TABS
2.5000 mg | ORAL_TABLET | Freq: Two times a day (BID) | ORAL | 0 refills | Status: DC
  Filled 2024-01-29: qty 60, 30d supply, fill #0

## 2024-01-29 MED ORDER — ONDANSETRON HCL 4 MG/2ML IJ SOLN
INTRAMUSCULAR | Status: AC
Start: 2024-01-29 — End: ?
  Filled 2024-01-29: qty 2

## 2024-01-29 MED ORDER — APIXABAN 2.5 MG PO TABS
2.5000 mg | ORAL_TABLET | Freq: Two times a day (BID) | ORAL | Status: DC
  Administered 2024-01-30: 2.5 mg via ORAL
  Filled 2024-01-29: qty 1

## 2024-01-29 MED ORDER — TRANEXAMIC ACID 1000 MG/10ML IV SOLN
INTRAVENOUS | Status: DC | PRN
  Administered 2024-01-29: 2000 mg via TOPICAL

## 2024-01-29 MED ORDER — PHENOL 1.4 % MT LIQD: 1.0000 | OROMUCOSAL | Status: DC | PRN

## 2024-01-29 MED ORDER — 0.9 % SODIUM CHLORIDE (POUR BTL) OPTIME
TOPICAL | Status: DC | PRN
  Administered 2024-01-29: 1000 mL

## 2024-01-29 MED ORDER — MEPERIDINE HCL 25 MG/ML IJ SOLN: 6.2500 mg | INTRAMUSCULAR | Status: DC | PRN

## 2024-01-29 MED ORDER — AMLODIPINE BESYLATE 10 MG PO TABS: 10.0000 mg | ORAL_TABLET | Freq: Every day | ORAL | Status: DC

## 2024-01-29 MED ORDER — OXYCODONE HCL 5 MG PO TABS
5.0000 mg | ORAL_TABLET | ORAL | Status: DC | PRN
  Administered 2024-01-30 (×2): 10 mg via ORAL

## 2024-01-29 MED ORDER — INSULIN ASPART 100 UNIT/ML IJ SOLN
0.0000 [IU] | INTRAMUSCULAR | Status: DC | PRN
  Administered 2024-01-29: 2 [IU] via SUBCUTANEOUS

## 2024-01-29 MED ORDER — METHOCARBAMOL 1000 MG/10ML IJ SOLN: 500.0000 mg | Freq: Four times a day (QID) | INTRAMUSCULAR | Status: DC | PRN

## 2024-01-29 SURGICAL SUPPLY — 77 items
ALCOHOL 70% 16 OZ (MISCELLANEOUS) ×2 IMPLANT
BAG COUNTER SPONGE SURGICOUNT (BAG) IMPLANT
BAG DECANTER FOR FLEXI CONT (MISCELLANEOUS) ×2 IMPLANT
BANDAGE ESMARK 6X9 LF (GAUZE/BANDAGES/DRESSINGS) IMPLANT
BIT DRILL QUICK REL 1/8 2PK SL (BIT) IMPLANT
BLADE SAG 18X100X1.27 (BLADE) ×2 IMPLANT
BLADE SAW SAG 90X13X1.27 (BLADE) IMPLANT
BLADE SAW SGTL 73X25 THK (BLADE) ×2 IMPLANT
BNDG ESMARK 6X9 LF (GAUZE/BANDAGES/DRESSINGS) ×1 IMPLANT
BOWL SMART MIX CTS (DISPOSABLE) ×2 IMPLANT
CLSR STERI-STRIP ANTIMIC 1/2X4 (GAUZE/BANDAGES/DRESSINGS) ×4 IMPLANT
COMP FEM STD PS KNEE 7 RT (Joint) ×1 IMPLANT
COMP PATELLA 3 PEG 35 (Joint) ×1 IMPLANT
COMPONENT FEM STD PS KNEE 7 RT (Joint) IMPLANT
COMPONENT PATELLA 3 PEG 35 (Joint) IMPLANT
COOLER ICEMAN CLASSIC (MISCELLANEOUS) ×2 IMPLANT
COVER SURGICAL LIGHT HANDLE (MISCELLANEOUS) ×2 IMPLANT
CUFF TOURN SGL QUICK 42 (TOURNIQUET CUFF) IMPLANT
CUFF TRNQT CYL 34X4.125X (TOURNIQUET CUFF) ×2 IMPLANT
DERMABOND ADVANCED .7 DNX12 (GAUZE/BANDAGES/DRESSINGS) ×2 IMPLANT
DRAPE EXTREMITY T 121X128X90 (DISPOSABLE) ×2 IMPLANT
DRAPE HALF SHEET 40X57 (DRAPES) ×2 IMPLANT
DRAPE INCISE IOBAN 66X45 STRL (DRAPES) ×2 IMPLANT
DRAPE POUCH INSTRU U-SHP 10X18 (DRAPES) ×2 IMPLANT
DRAPE SURG ORHT 6 SPLT 77X108 (DRAPES) IMPLANT
DRAPE U-SHAPE 47X51 STRL (DRAPES) ×4 IMPLANT
DRSG AQUACEL AG ADV 3.5X10 (GAUZE/BANDAGES/DRESSINGS) ×2 IMPLANT
DURAPREP 26ML APPLICATOR (WOUND CARE) ×6 IMPLANT
ELECT CAUTERY BLADE 6.4 (BLADE) ×2 IMPLANT
ELECT PENCIL ROCKER SW 15FT (MISCELLANEOUS) ×2 IMPLANT
ELECT REM PT RETURN 9FT ADLT (ELECTROSURGICAL) ×1 IMPLANT
ELECTRODE REM PT RTRN 9FT ADLT (ELECTROSURGICAL) ×2 IMPLANT
GLOVE BIOGEL PI IND STRL 7.0 (GLOVE) ×4 IMPLANT
GLOVE BIOGEL PI IND STRL 7.5 (GLOVE) ×10 IMPLANT
GLOVE ECLIPSE 7.0 STRL STRAW (GLOVE) ×6 IMPLANT
GLOVE INDICATOR 7.0 STRL GRN (GLOVE) ×2 IMPLANT
GLOVE INDICATOR 7.5 STRL GRN (GLOVE) ×2 IMPLANT
GLOVE SURG SYN 7.5 E (GLOVE) ×2 IMPLANT
GLOVE SURG SYN 7.5 PF PI (GLOVE) ×4 IMPLANT
GLOVE SURG UNDER LTX SZ7.5 (GLOVE) ×4 IMPLANT
GLOVE SURG UNDER POLY LF SZ7 (GLOVE) ×4 IMPLANT
GOWN STRL REUS W/ TWL LRG LVL3 (GOWN DISPOSABLE) ×2 IMPLANT
GOWN STRL SURGICAL XL XLNG (GOWN DISPOSABLE) ×2 IMPLANT
GOWN TOGA ZIPPER T7+ PEEL AWAY (MISCELLANEOUS) ×4 IMPLANT
HOOD PEEL AWAY T7 (MISCELLANEOUS) ×2 IMPLANT
INSERT TIB ARTISURF SZ6-7 R 11 (Joint) IMPLANT
KIT BASIN OR (CUSTOM PROCEDURE TRAY) ×2 IMPLANT
KIT TURNOVER KIT B (KITS) ×2 IMPLANT
MANIFOLD NEPTUNE II (INSTRUMENTS) ×2 IMPLANT
MARKER SKIN DUAL TIP RULER LAB (MISCELLANEOUS) ×4 IMPLANT
NDL SPNL 18GX3.5 QUINCKE PK (NEEDLE) ×2 IMPLANT
NEEDLE SPNL 18GX3.5 QUINCKE PK (NEEDLE) ×1 IMPLANT
NS IRRIG 1000ML POUR BTL (IV SOLUTION) ×2 IMPLANT
PACK TOTAL JOINT (CUSTOM PROCEDURE TRAY) ×2 IMPLANT
PAD ARMBOARD POSITIONER FOAM (MISCELLANEOUS) ×4 IMPLANT
PAD COLD SHLDR WRAP-ON (PAD) ×2 IMPLANT
PIN DRILL HDLS TROCAR 75 4PK (PIN) IMPLANT
PROS TIB KNEE PS 0D F RT (Joint) ×1 IMPLANT
PROSTHESIS TIB KNEE PS 0D F RT (Joint) IMPLANT
SCREW FEMALE HEX FIX 25X2.5 (ORTHOPEDIC DISPOSABLE SUPPLIES) IMPLANT
SET HNDPC FAN SPRY TIP SCT (DISPOSABLE) ×2 IMPLANT
SOLUTION PRONTOSAN WOUND 350ML (IRRIGATION / IRRIGATOR) ×2 IMPLANT
STAPLER VISISTAT 35W (STAPLE) IMPLANT
SUCTION TUBE FRAZIER 10FR DISP (SUCTIONS) ×2 IMPLANT
SUT ETHILON 2 0 FS 18 (SUTURE) IMPLANT
SUT MNCRL AB 3-0 PS2 27 (SUTURE) IMPLANT
SUT STRATAFIX 1PDS 45CM VIOLET (SUTURE) IMPLANT
SUT VIC AB 0 CT1 27XBRD ANBCTR (SUTURE) ×4 IMPLANT
SUT VIC AB 1 CTX 27 (SUTURE) ×6 IMPLANT
SUT VIC AB 2-0 CT1 TAPERPNT 27 (SUTURE) ×8 IMPLANT
SYR 50ML LL SCALE MARK (SYRINGE) ×4 IMPLANT
TOWEL GREEN STERILE (TOWEL DISPOSABLE) ×2 IMPLANT
TOWEL GREEN STERILE FF (TOWEL DISPOSABLE) ×2 IMPLANT
TRAY CATH INTERMITTENT SS 16FR (CATHETERS) IMPLANT
TUBE SUCT ARGYLE STRL (TUBING) ×2 IMPLANT
UNDERPAD 30X36 HEAVY ABSORB (UNDERPADS AND DIAPERS) ×2 IMPLANT
YANKAUER SUCT BULB TIP NO VENT (SUCTIONS) ×4 IMPLANT

## 2024-01-29 NOTE — H&P (Signed)
 PREOPERATIVE H&P  Chief Complaint: right knee osteoarthritis  HPI: Craig House is a 63 y.o. male who presents for surgical treatment of right knee osteoarthritis.  He denies any changes in medical history.  Past Surgical History:  Procedure Laterality Date   KNEE ARTHROSCOPY Bilateral    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   Procedure: LEFT TOTAL KNEE ARTHROPLASTY;  Surgeon: Tarry Kos, MD;  Location: [facility] OR;  Service: Orthopedics;  Laterality: Left;   Social History   Socioeconomic History   Marital status: Single    Spouse name: Not on file   Number of children: Not on file   Years of education: Not on file   Highest education level: Some college, no degree  Occupational History   Not on file  Tobacco Use   Smoking status: Former   Smokeless tobacco: Never  Vaping Use   Vaping status: Never Used  Substance and Sexual Activity   Alcohol use: Yes    Alcohol/week: 1.0 standard drink of alcohol    Types: 1 Standard drinks or equivalent per week    Comment: 1 glass of red wine at night   Drug use: No   Sexual activity: Yes  Other Topics Concern   Not on file  Social History Narrative   Not on file   Social Drivers of Health   Financial Resource Strain: Low Risk  (01/12/2024)   Overall Financial Resource Strain (CARDIA)    Difficulty of Paying Living Expenses: Not very hard  Food Insecurity: No Food Insecurity (01/12/2024)   Hunger Vital Sign    Worried About Running Out of Food in the Last Year: Never true    Ran Out of Food in the Last Year: Never true  Transportation Needs: No Transportation Needs (01/12/2024)   PRAPARE - Administrator, Civil Service (Medical): No    Lack of Transportation (Non-Medical): No  Physical Activity: Sufficiently Active (01/12/2024)   Exercise Vital Sign    Days of Exercise per Week: 3 days    Minutes of Exercise per Session: 60 min  Stress: No Stress Concern Present (01/12/2024)   [employer] of  Occupational Health - Occupational Stress Questionnaire    Feeling of Stress : Only a little  Social Connections: Moderately Integrated (01/12/2024)   Social Connection and Isolation Panel [NHANES]    Frequency of Communication with Friends and Family: More than three times a week    Frequency of Social Gatherings with Friends and Family: Twice a week    Attends Religious Services: More than 4 times per year    Active Member of [employer] or Organizations: No    Attends Engineer, structural: More than 4 times per year    Marital Status: Divorced   Family History  Problem Relation Age of Onset   Heart attack Mother    Cancer Father    Heart disease Father    Colon cancer Neg Hx    No Known Allergies Prior to Admission medications   Medication Sig Start Date End Date Taking? Authorizing Provider  albuterol (VENTOLIN HFA) 108 (90 Base) MCG/ACT inhaler INHALE 2 PUFFS BY MOUTH EVERY 6 HOURS AS NEEDED FOR WHEEZING OR SHORTNESS OF BREATH Patient taking differently: Inhale 3 puffs into the lungs as needed for wheezing or shortness of breath. 11/09/21  Yes Saguier, Ramon Dredge, PA-C  amLODipine (NORVASC) 10 MG tablet TAKE 1 TABLET BY MOUTH EVERY DAY 09/25/23  Yes Saguier, Ramon Dredge, PA-C  atorvastatin (LIPITOR) 20 MG tablet  TAKE 1 TABLET BY MOUTH EVERY DAY 12/09/22  Yes Saguier, Ramon Dredge, PA-C  Brimonidine Tartrate (LUMIFY) 0.025 % SOLN Place 1 drop into both eyes daily as needed (redness).   Yes [provider]  Cholecalciferol (DIALYVITE VITAMIN D 5000) 125 MCG (5000 UT) capsule Take 5,000 Units by mouth every other day.   Yes [provider]  Cinnamon 500 MG TABS Take 500 mg by mouth daily.   Yes [provider]  insulin lispro (HUMALOG KWIKPEN) 100 UNIT/ML KwikPen Inject 4 Units into the skin 3 (three) times daily. Patient taking differently: Inject 4 Units into the skin as needed (high bs). 05/05/23  Yes Saguier, Ramon Dredge, PA-C  Iron-Vitamin C 65-125 MG TABS Take 1 tablet by  mouth daily.   Yes [provider]  metFORMIN (GLUCOPHAGE) 500 MG tablet TAKE 1 TABLET(500 MG) BY MOUTH TWICE DAILY WITH A MEAL 04/24/23  Yes Saguier, Ramon Dredge, PA-C  Multiple Vitamin (MULTI-VITAMINS) TABS Take 1 tablet by mouth daily.   Yes [provider]  Semaglutide,0.25 or 0.5MG /DOS, (OZEMPIC, 0.25 OR 0.5 MG/DOSE,) 2 MG/3ML SOPN Inject 0.5 mg into the skin once a week. 01/13/24  Yes Saguier, Ramon Dredge, PA-C  triamcinolone cream (KENALOG) 0.1 % Apply 1 Application topically 2 (two) times daily. Patient taking differently: Apply 1 Application topically as needed (rash). 05/05/23  Yes Saguier, Ramon Dredge, PA-C  triamterene-hydrochlorothiazide (DYAZIDE) 37.5-25 MG capsule Take 1 each (1 capsule total) by mouth every morning. 07/06/23  Yes Saguier, Ramon Dredge, PA-C  TURMERIC PO Take 1 capsule by mouth daily.   Yes [provider]  cefadroxil (DURICEF) 500 MG capsule Take 1 capsule (500 mg total) by mouth 2 (two) times daily. To be taken after surgery Patient not taking: Reported on 01/24/2024 01/02/23   Cristie Hem, PA-C  docusate sodium (COLACE) 100 MG capsule Take 1 capsule (100 mg total) by mouth daily as needed. Patient not taking: Reported on 01/24/2024 01/17/24 01/16/25  Cristie Hem, PA-C  doxycycline (VIBRAMYCIN) 100 MG capsule Take 1 capsule (100 mg total) by mouth 2 (two) times daily. To be taken after surgery Patient not taking: Reported on 01/24/2024 01/17/24   Cristie Hem, PA-C  methocarbamol (ROBAXIN-750) 750 MG tablet Take 1 tablet (750 mg total) by mouth 2 (two) times daily as needed for muscle spasms. To be taken after surgery Patient not taking: Reported on 01/24/2024 01/02/23   Cristie Hem, PA-C  ondansetron (ZOFRAN) 4 MG tablet Take 1 tablet (4 mg total) by mouth every 8 (eight) hours as needed for nausea or vomiting. Patient not taking: Reported on 01/24/2024 01/02/23   Cristie Hem, PA-C  oxyCODONE-acetaminophen (PERCOCET) 5-325 MG tablet Take 1-2 tablets by  mouth every 6 (six) hours as needed. To be taken after surgery Patient not taking: Reported on 01/24/2024 01/02/23   Cristie Hem, PA-C  oxyCODONE-acetaminophen (PERCOCET) 7.5-325 MG tablet Take 1-2 tablets by mouth every 6 (six) hours as needed for severe pain. Patient not taking: Reported on 01/24/2024 01/18/23   Cristie Hem, PA-C  traMADol (ULTRAM) 50 MG tablet Take 1 tablet (50 mg total) by mouth 3 (three) times daily as needed. Patient not taking: Reported on 01/24/2024 02/22/23   Cristie Hem, PA-C     Positive ROS: All other systems have been reviewed and were otherwise negative with the exception of those mentioned in the HPI and as above.  Physical Exam: General: Alert, no acute distress Cardiovascular: No pedal edema Respiratory: No cyanosis, no use of accessory musculature GI:  abdomen soft Skin: No lesions in the area of chief complaint Neurologic: Sensation intact distally Psychiatric: Patient is competent for consent with normal mood and affect Lymphatic: no lymphedema  MUSCULOSKELETAL: exam stable  Assessment: right knee osteoarthritis  Plan: Plan for Procedure(s): RIGHT TOTAL KNEE ARTHROPLASTY  The risks benefits and alternatives were discussed with the patient including but not limited to the risks of nonoperative treatment, versus surgical intervention including infection, bleeding, nerve injury,  blood clots, cardiopulmonary complications, morbidity, mortality, among others, and they were willing to proceed.   Glee Arvin, MD 01/29/2024 6:20 AM

## 2024-01-29 NOTE — Evaluation (Signed)
 Physical Therapy Evaluation Patient Details Name: Craig House MRN: [id number] DOB: Dec 17, 1960 Today's Date: 01/29/2024  History of Present Illness  63 y.o. male presents to [facility] on [facility]/09/2023 for elective R TKA. PMH includes anemia, asthma, DMII, HTN and L TKA 2024.  Clinical Impression  Patient presents with decreased mobility due to R knee pain and limited ROM/activity tolerance.  Previously independent and working.  Today needing S to CGA for ambulation in hallway and stair negotiation.  Able to demonstrate mobility safe for d/c home.  Follow up PT as planned per MD.  HEP issued.  No further skilled PT needs.         If plan is discharge home, recommend the following: Help with stairs or ramp for entrance;Assist for transportation   Can travel by private vehicle        Equipment Recommendations None recommended by PT  Recommendations for Other Services       Functional Status Assessment Patient has had a recent decline in their functional status and demonstrates the ability to make significant improvements in function in a reasonable and predictable amount of time.     Precautions / Restrictions Precautions Precautions: Fall;Knee Restrictions Weight Bearing Restrictions Per Provider Order: Yes RLE Weight Bearing Per Provider Order: Weight bearing as tolerated      Mobility  Bed Mobility Overal bed mobility: Modified Independent             General bed mobility comments: HOB up    Transfers Overall transfer level: Needs assistance Equipment used: Rolling walker (2 wheels) Transfers: Sit to/from Stand Sit to Stand: Supervision, Contact guard assist           General transfer comment: up from EOB with CGA for safety, stand to sit with S    Ambulation/Gait Ambulation/Gait assistance: Supervision, Contact guard assist Gait Distance (Feet): 360 Feet Assistive device: Rolling walker (2 wheels) Gait Pattern/deviations: Step-through pattern,  Decreased stride length, Antalgic       General Gait Details: cues for sequence initially  Stairs Stairs: Yes Stairs assistance: Contact guard assist Stair Management: One rail Right, Step to pattern, With cane, Forwards Number of Stairs: 5 General stair comments: cues for sequence  Wheelchair Mobility     Tilt Bed    Modified Rankin (Stroke Patients Only)       Balance Overall balance assessment: Needs assistance   Sitting balance-Leahy Scale: Good       Standing balance-Leahy Scale: Good Standing balance comment: standing to urinate at toilet with S                             Pertinent Vitals/Pain Pain Assessment Pain Assessment: 0-10 Pain Score: 7  Pain Location: R knee Pain Descriptors / Indicators: Aching, Discomfort Pain Intervention(s): Monitored during session, Premedicated before session, Ice applied    Home Living Family/patient expects to be discharged to:: Private residence Living Arrangements: Spouse/significant other Available Help at Discharge: Family;Available 24 hours/day Type of Home: House Home Access: Stairs to enter   Entrance Stairs-Number of Steps: 5 Alternate Level Stairs-Number of Steps: 7 Home Layout: Two level Home Equipment: Agricultural consultant (2 wheels);Shower seat;Crutches      Prior Function Prior Level of Function : Independent/Modified Independent;Working/employed;Driving             Mobility Comments: works for [employer]       Extremity/Trunk Assessment   Upper Extremity Assessment Upper Extremity Assessment: Overall WFL for  tasks assessed    Lower Extremity Assessment Lower Extremity Assessment: RLE deficits/detail RLE Deficits / Details: AROM grossly 60 in supine, ankle AROM WFL, strength hip flexion 3-/5, knee extension 3/5       Communication   Communication Communication: No apparent difficulties    Cognition Arousal: Alert Behavior During Therapy: WFL for tasks  assessed/performed   PT - Cognitive impairments: No apparent impairments                         Following commands: Intact       Cueing       General Comments General comments (skin integrity, edema, etc.): Issued HEP and educated on knee precautions, patient asking about CPM< states his MD ordered and he had it before.    Exercises Total Joint Exercises Ankle Circles/Pumps: AROM, 10 reps, Both, Supine Quad Sets: AROM, 5 reps, Right, Supine Heel Slides: AROM, 5 reps, Right, Supine   Assessment/Plan    PT Assessment All further PT needs can be met in the next venue of care  PT Problem List         PT Treatment Interventions      PT Goals (Current goals can be found in the Care Plan section)  Acute Rehab PT Goals PT Goal Formulation: All assessment and education complete, DC therapy    Frequency       Co-evaluation               AM-PAC PT "6 Clicks" Mobility  Outcome Measure Help needed turning from your back to your side while in a flat bed without using bedrails?: None Help needed moving from lying on your back to sitting on the side of a flat bed without using bedrails?: None Help needed moving to and from a bed to a chair (including a wheelchair)?: None Help needed standing up from a chair using your arms (e.g., wheelchair or bedside chair)?: A Little Help needed to walk in hospital room?: None Help needed climbing 3-5 steps with a railing? : A Little 6 Click Score: 22    End of Session Equipment Utilized During Treatment: Gait belt Activity Tolerance: Patient tolerated treatment well Patient left: in bed;with call bell/phone within reach;with family/visitor present Nurse Communication: Mobility status PT Visit Diagnosis: Difficulty in walking, not elsewhere classified (R26.2)    Time: 1610-9604 PT Time Calculation (min) (ACUTE ONLY): 30 min   Charges:   PT Evaluation $PT Eval Low Complexity: 1 Low PT Treatments $Gait Training: 8-22  mins PT General Charges $$ ACUTE PT VISIT: 1 Visit         Sheran Lawless, PT Acute Rehabilitation Services Office:[phone] 01/29/2024   Elray Mcgregor 01/29/2024, 4:36 PM

## 2024-01-29 NOTE — Anesthesia Procedure Notes (Signed)
 Anesthesia Regional Block: Adductor canal block   Pre-Anesthetic Checklist: , timeout performed,  Correct Patient, Correct Site, Correct Laterality,  Correct Procedure, Correct Position, site marked,  Risks and benefits discussed,  Surgical consent,  Pre-op evaluation,  At surgeon's request and post-op pain management  Laterality: Right  Prep: chloraprep       Needles:  Injection technique: Single-shot  Needle Type: Echogenic Stimulator Needle     Needle Length: 9cm  Needle Gauge: 21     Additional Needles:   Procedures:,,,, ultrasound used (permanent image in chart),,    Narrative:  Start time: 01/29/2024 8:00 AM End time: 01/29/2024 8:05 AM Injection made incrementally with aspirations every 5 mL.  Performed by: Personally  Anesthesiologist: Shelton Silvas, MD  Additional Notes: Discussed risks and benefits of the nerve block in detail, including but not limited vascular injury, permanent nerve damage and infection.   Patient tolerated the procedure well. Local anesthetic introduced in an incremental fashion under minimal resistance after negative aspirations. No paresthesias were elicited. After completion of the procedure, no acute issues were identified and patient continued to be monitored by RN.

## 2024-01-29 NOTE — Op Note (Signed)
 Total Knee Arthroplasty Procedure Note  Preoperative diagnosis: Right knee osteoarthritis  Postoperative diagnosis:same  Operative findings: Complete loss of joint space from medial and patellofemoral compartments Varus deformity Preoperative range of motion 0 to 90 degrees Postop range of motion 0 to 125 degrees  Operative procedure: Right total knee arthroplasty. CPT [phone]  Surgeon: N. Glee Arvin, MD  Assist: Oneal Grout, PA-C; necessary for the timely completion of procedure and due to complexity of procedure.  Anesthesia: Spinal, regional  Tourniquet time: see anesthesia record  Implants used: Zimmer persona press-fit Femur: CR 7 standard Tibia: F Patella: 35 mm Polyethylene: 11 mm medial congruent  Indication: Craig House is a 63 y.o. year old male with a history of knee pain. Having failed conservative management, the patient elected to proceed with a total knee arthroplasty.  We have reviewed the risk and benefits of the surgery and they elected to proceed after voicing understanding.  Procedure:  After informed consent was obtained and understanding of the risk were voiced including but not limited to bleeding, infection, damage to surrounding structures including nerves and vessels, blood clots, leg length inequality and the failure to achieve desired results, the operative extremity was marked with verbal confirmation of the patient in the holding area.   The patient was then brought to the operating room and transported to the operating room table in the supine position.  A tourniquet was applied to the operative extremity around the upper thigh. The operative limb was then prepped and draped in the usual sterile fashion and preoperative antibiotics were administered.  A time out was performed prior to the start of surgery confirming the correct extremity, preoperative antibiotic administration, as well as team members, implants and instruments  available for the case. Correct surgical site was also confirmed with preoperative radiographs. The limb was then elevated for exsanguination and the tourniquet was inflated. A midline incision was made and a standard medial parapatellar approach was performed.  The patella was everted which showed complete loss of articular cartilage.  The patella was prepared and sized to a 35 mm.  A cover was placed on the patella for protection from retractors.  We then turned our attention to the femur.  The ACL was sacrificed. Start site was drilled in the femur and the intramedullary distal femoral cutting guide was placed, set at 5 degrees valgus, taking 10 mm of distal resection. The distal cut was made. Osteophytes were then removed.   Next, the proximal tibial cutting guide was placed with appropriate slope, varus/valgus alignment and depth of resection. A drop rod was attached to confirm that it was pointed to the second metatarsal.  The proximal tibial cut was made taking for mm off the low side. Gap blocks were then used to assess the extension gap and alignment, and appropriate soft tissue releases were performed. Attention was turned back to the femur, which was sized using the sizing guide to a size 7. Appropriate rotation of the femoral component was determined using epicondylar axis, Whiteside's line, and assessing the flexion gap under ligament tension. The appropriate size 4-in-1 cutting block was placed and cuts were made.  Posterior femoral osteophytes and uncapped bone were then removed with the curved osteotome.  Trial components were placed, and stability was checked in full extension, mid-flexion, and deep flexion.  The PCL was resected to balance the flexion space.  The patella tracked well without a lateral release. Trial components were then removed and tibial preparation performed.  The  tibial bone quality was excellent.   The tibial trial was pointed to the medial third of the tibial tubercle.   The tibia was sized for a size F component.  The bony surfaces were irrigated with a pulse lavage and then dried. Final components were placed.  The stability of the construct was re-evaluated throughout a range of motion and found to be acceptable. The trial liner was removed, the knee was copiously irrigated, and the knee was re-evaluated for any excess bone debris. The real polyethylene liner, 11 mm thick, was inserted and checked to ensure the locking mechanism had engaged appropriately. The tourniquet was deflated and hemostasis was achieved. The wound was irrigated with normal saline.  One gram of vancomycin powder was placed in the surgical bed.  Topical mixture of 0.25% bupivacaine and meloxicam was placed in the joint for postoperative pain.  Capsular closure was performed with a #1 statafix in flexion, subcutaneous fat closed with a 0 vicryl suture, then subcutaneous tissue closed with interrupted 2.0 vicryl suture. The skin was then closed with a 2-0 nylon and Dermabond. A sterile dressing was applied.  The patient was awakened in the operating room and taken to recovery in stable condition. All sponge, needle, and instrument counts were correct at the end of the case.  Craig House was necessary for opening, closing, retracting, limb positioning and overall facilitation and completion of the surgery.  Position: supine  Complications: none.  Time Out: performed   Drains/Packing: none  Estimated blood loss: minimal  Returned to Recovery Room: in good condition.   Antibiotics: yes   Mechanical VTE (DVT) Prophylaxis: sequential compression devices, TED thigh-high  Chemical VTE (DVT) Prophylaxis: Eliquis  Fluid Replacement  Crystalloid: see anesthesia record Blood: none  FFP: none   Specimens Removed: 1 to pathology   Sponge and Instrument Count Correct? yes   PACU: portable radiograph - knee AP and Lateral   Plan/RTC: Return in 2 weeks for wound check.   Weight  Bearing/Load Lower Extremity: full   Implant Name Type Inv. Item Serial No. Manufacturer Lot No. LRB No. Used Action  PROS TIB KNEE PS 0D F RT - [id number] Joint PROS TIB KNEE PS 0D F RT  ZIMMER RECON(ORTH,TRAU,BIO,SG) [id number] Right 1 Implanted  COMP FEM STD PS KNEE 7 RT - [id number] Joint COMP FEM STD PS KNEE 7 RT  ZIMMER RECON(ORTH,TRAU,BIO,SG) [id number] Right 1 Implanted  INSERT TIB ARTISURF SZ6-7 R 11 - [id number] Joint INSERT TIB ARTISURF SZ6-7 R 11  ZIMMER RECON(ORTH,TRAU,BIO,SG) [id number] Right 1 Implanted  COMP PATELLA 3 PEG 35 - [id number] Joint COMP PATELLA 3 PEG 35  ZIMMER RECON(ORTH,TRAU,BIO,SG) [id number] Right 1 Implanted    N. Glee Arvin, MD [facility] 10:39 AM

## 2024-01-29 NOTE — Transfer of Care (Signed)
 Immediate Anesthesia Transfer of Care Note  Patient: Craig House  Procedure(s) Performed: RIGHT TOTAL KNEE ARTHROPLASTY (Right: Knee)  Patient Location: PACU  Anesthesia Type:Spinal and MAC combined with regional for post-op pain  Level of Consciousness: awake and drowsy  Airway & Oxygen Therapy: Patient Spontanous Breathing  Post-op Assessment: Report given to RN and Post -op Vital signs reviewed and stable  Post vital signs: Reviewed and stable  Last Vitals:  Vitals Value Taken Time  BP 103/69 01/29/24 1103  Temp    Pulse 79 01/29/24 1105  Resp 17 01/29/24 1105  SpO2 96 % 01/29/24 1105  Vitals shown include unfiled device data.  Last Pain:  Vitals:   01/29/24 0810  TempSrc:   PainSc: 0-No pain         Complications: No notable events documented.

## 2024-01-29 NOTE — Discharge Instructions (Signed)

## 2024-01-29 NOTE — Anesthesia Procedure Notes (Addendum)
 Spinal  Start time: 01/29/2024 9:18 AM End time: 01/29/2024 9:20 AM Reason for block: surgical anesthesia Staffing Performed: anesthesiologist  Anesthesiologist: Shelton Silvas, MD Performed by: Shelton Silvas, MD Authorized by: Shelton Silvas, MD   Preanesthetic Checklist Completed: patient identified, IV checked, site marked, risks and benefits discussed, surgical consent, monitors and equipment checked, pre-op evaluation and timeout performed Spinal Block Patient position: sitting Prep: DuraPrep and site prepped and draped Location: L3-4 Injection technique: single-shot Needle Needle type: Pencan  Needle gauge: 24 G Needle length: 10 cm Needle insertion depth: 10 cm Additional Notes Patient tolerated well. No immediate complications.  Functioning IV was confirmed and monitors were applied. Sterile prep and drape, including hand hygiene and sterile gloves were used. The patient was positioned and the back was prepped. The skin was anesthetized with lidocaine. Free flow of clear CSF was obtained prior to injecting local anesthetic into the CSF. The spinal needle aspirated freely following injection. The needle was carefully withdrawn. The patient tolerated the procedure well.

## 2024-01-29 NOTE — Anesthesia Preprocedure Evaluation (Addendum)
 Anesthesia Evaluation  Patient identified by MRN, date of birth, ID band Patient awake    Reviewed: Allergy & Precautions, NPO status , Patient's Chart, lab work & pertinent test results  Airway Mallampati: II  TM Distance: >3 FB Neck ROM: Full    Dental  (+) Teeth Intact, Dental Advisory Given   Pulmonary asthma , former smoker   breath sounds clear to auscultation       Cardiovascular hypertension, Pt. on medications  Rhythm:Regular Rate:Normal     Neuro/Psych negative neurological ROS  negative psych ROS   GI/Hepatic negative GI ROS, Neg liver ROS,,,  Endo/Other  diabetes, Type 2, Oral Hypoglycemic Agents    Renal/GU negative Renal ROS     Musculoskeletal   Abdominal   Peds  Hematology   Anesthesia Other Findings   Reproductive/Obstetrics                             Anesthesia Physical Anesthesia Plan  ASA: 2  Anesthesia Plan: Spinal   Post-op Pain Management: Regional block*   Induction: Intravenous  PONV Risk Score and Plan: 1 and Ondansetron, Dexamethasone, Propofol infusion and Midazolam  Airway Management Planned: Natural Airway and Nasal Cannula  Additional Equipment: None  Intra-op Plan:   Post-operative Plan:   Informed Consent: I have reviewed the patients History and Physical, chart, labs and discussed the procedure including the risks, benefits and alternatives for the proposed anesthesia with the patient or authorized representative who has indicated his/her understanding and acceptance.       Plan Discussed with: CRNA  Anesthesia Plan Comments: (Lab Results      Component                Value               Date                      WBC                      6.4                 01/24/2024                HGB                      15.1                01/24/2024                HCT                      42.9                01/24/2024                MCV                       90.9                01/24/2024                PLT                      372                 01/24/2024           )  Anesthesia Quick Evaluation

## 2024-01-29 NOTE — Anesthesia Postprocedure Evaluation (Signed)
 Anesthesia Post Note  Patient: Craig House  Procedure(s) Performed: RIGHT TOTAL KNEE ARTHROPLASTY (Right: Knee)     Patient location during evaluation: PACU Anesthesia Type: Spinal Level of consciousness: awake and alert Pain management: pain level controlled Vital Signs Assessment: post-procedure vital signs reviewed and stable Respiratory status: spontaneous breathing, nonlabored ventilation, respiratory function stable and patient connected to nasal cannula oxygen Cardiovascular status: blood pressure returned to baseline and stable Postop Assessment: no apparent nausea or vomiting Anesthetic complications: no  No notable events documented.  Last Vitals:  Vitals:   01/29/24 1338 01/29/24 1515  BP: 120/81 (!) 136/90  Pulse: 76 83  Resp: 20 20  Temp: 36.4 C 36.5 C  SpO2: 98% 100%    Last Pain:  Vitals:   01/29/24 1515  TempSrc: Oral  PainSc:                  Shelton Silvas

## 2024-01-29 NOTE — Anesthesia Procedure Notes (Signed)
 Procedure Name: MAC Date/Time: 01/29/2024 9:17 AM  Performed by: Little Ishikawa, CRNAPre-anesthesia Checklist: Patient identified, Emergency Drugs available, Suction available, Timeout performed and Patient being monitored Patient Re-evaluated:Patient Re-evaluated prior to induction Oxygen Delivery Method: Simple face mask Induction Type: IV induction Dental Injury: Teeth and Oropharynx as per pre-operative assessment

## 2024-01-30 ENCOUNTER — Other Ambulatory Visit (HOSPITAL_COMMUNITY): Payer: Self-pay

## 2024-01-30 ENCOUNTER — Encounter (HOSPITAL_COMMUNITY): Payer: Self-pay | Admitting: Orthopaedic Surgery

## 2024-01-30 DIAGNOSIS — M1711 Unilateral primary osteoarthritis, right knee: Secondary | ICD-10-CM | POA: Diagnosis not present

## 2024-01-30 LAB — GLUCOSE, CAPILLARY: Glucose-Capillary: 146 mg/dL — ABNORMAL HIGH (ref 70–99)

## 2024-01-30 NOTE — Progress Notes (Signed)
 Patient alert and oriented, void, ambulate. Surgical site clean and dry no sign of infection. D/ c instructions explain and given to the patient all questions answered.

## 2024-01-30 NOTE — Evaluation (Signed)
 Occupational Therapy Evaluation Patient Details Name: Craig House MRN: [id number] DOB: 11-12-1961 Today's Date: 01/30/2024   History of Present Illness   63 y.o. male presents to [facility] hospital on 01/09/2023 for elective R TKA. PMH includes anemia, asthma, DMII, HTN and L TKA 2024.     Clinical Impressions Patient evaluated by Occupational Therapy with no further acute OT needs identified. All education has been completed and the patient has no further questions. See below for any follow-up Occupational Therapy or equipment needs. OT to sign off. Thank you for referral.       If plan is discharge home, recommend the following:         Functional Status Assessment   Patient has had a recent decline in their functional status and demonstrates the ability to make significant improvements in function in a reasonable and predictable amount of time.     Equipment Recommendations   None recommended by OT     Recommendations for Other Services         Precautions/Restrictions   Precautions Precautions: Fall;Knee Restrictions Weight Bearing Restrictions Per Provider Order: Yes RLE Weight Bearing Per Provider Order: Weight bearing as tolerated     Mobility Bed Mobility Overal bed mobility: Modified Independent                  Transfers Overall transfer level: Modified independent Equipment used: Rolling walker (2 wheels) Transfers: Sit to/from Stand                    Balance Overall balance assessment: Needs assistance   Sitting balance-Leahy Scale: Good       Standing balance-Leahy Scale: Good                             ADL either performed or assessed with clinical judgement   ADL Overall ADL's : Modified independent                                       General ADL Comments: pt gathered items, completed dressing with hip flexion and reaching feet. pt demonstrates shower transfer and educated on  reverse of prior education due to new surgerical side     Vision Patient Visual Report: No change from baseline       Perception         Praxis         Pertinent Vitals/Pain Pain Assessment Pain Assessment: Faces Faces Pain Scale: Hurts a little bit Pain Location: R knee Pain Descriptors / Indicators: Aching, Discomfort Pain Intervention(s): Monitored during session, Premedicated before session, Repositioned     Extremity/Trunk Assessment Upper Extremity Assessment Upper Extremity Assessment: Overall WFL for tasks assessed   Lower Extremity Assessment Lower Extremity Assessment: Defer to PT evaluation   Cervical / Trunk Assessment Cervical / Trunk Assessment: Normal   Communication Communication Communication: No apparent difficulties   Cognition Arousal: Alert Behavior During Therapy: WFL for tasks assessed/performed Cognition: No apparent impairments                               Following commands: Intact       Cueing  General Comments      dressing dry and intact, educated to take of ted hose at night and reapply as directed in dc  recommendations   Exercises     Shoulder Instructions      Home Living Family/patient expects to be discharged to:: Private residence Living Arrangements: Spouse/significant other Available Help at Discharge: Family;Available 24 hours/day Type of Home: House Home Access: Stairs to enter [employer] of Steps: 5   Home Layout: Two level Alternate Level Stairs-Number of Steps: 7 Alternate Level Stairs-Rails: Right Bathroom Shower/Tub: Chief Strategy Officer: Standard     Home Equipment: Agricultural consultant (2 wheels);Shower seat;Crutches;Hand held shower head          Prior Functioning/Environment Prior Level of Function : Independent/Modified Independent;Working/employed;Driving             Mobility Comments: works for [employer]      OT Problem List:     OT  Treatment/Interventions:        OT Goals(Current goals can be found in the care plan section)   Acute Rehab OT Goals Potential to Achieve Goals: Good   OT Frequency:       Co-evaluation              AM-PAC OT "6 Clicks" Daily Activity     Outcome Measure Help from another person eating meals?: None Help from another person taking care of personal grooming?: None Help from another person toileting, which includes using toliet, bedpan, or urinal?: None Help from another person bathing (including washing, rinsing, drying)?: None Help from another person to put on and taking off regular upper body clothing?: None Help from another person to put on and taking off regular lower body clothing?: None 6 Click Score: 24   End of Session    Activity Tolerance: Patient tolerated treatment well Patient left: in bed;with call bell/phone within reach;with family/visitor present  OT Visit Diagnosis: Unsteadiness on feet (R26.81)                Time: 2952-8413 OT Time Calculation (min): 17 min Charges:  OT General Charges $OT Visit: 1 Visit OT Evaluation $OT Eval Moderate Complexity: 1 Mod   Brynn, OTR/L  Acute Rehabilitation Services Office: [phone] .   Mateo Flow 01/30/2024, 11:30 AM

## 2024-01-30 NOTE — Progress Notes (Signed)
 Subjective: 1 Day Post-Op Procedure(s) (LRB): RIGHT TOTAL KNEE ARTHROPLASTY (Right) Patient reports pain as mild.    Objective: Vital signs in last 24 hours: Temp:  [96.1 F (35.6 C)-99 F (37.2 C)] 99 F (37.2 C) (03/18 0722) Pulse Rate:  [62-91] 91 (03/18 0722) Resp:  [10-20] 18 (03/18 0722) BP: (90-145)/(60-90) 121/87 (03/18 0722) SpO2:  [94 %-100 %] 98 % (03/18 0722)  Intake/Output from previous day: 03/17 0701 - 03/18 0700 In: 1860 [P.O.:960; I.V.:900] Out: 515 [Urine:500; Blood:15] Intake/Output this shift: No intake/output data recorded.  No results for input(s): "HGB" in the last 72 hours. No results for input(s): "WBC", "RBC", "HCT", "PLT" in the last 72 hours. No results for input(s): "NA", "K", "CL", "CO2", "BUN", "CREATININE", "GLUCOSE", "CALCIUM" in the last 72 hours. No results for input(s): "LABPT", "INR" in the last 72 hours.  Neurologically intact Neurovascular intact Sensation intact distally Intact pulses distally Dorsiflexion/Plantar flexion intact Incision: scant drainage No cellulitis present Compartment soft   Assessment/Plan: 1 Day Post-Op Procedure(s) (LRB): RIGHT TOTAL KNEE ARTHROPLASTY (Right) Advance diet Up with therapy Discharge home with home health once cleared by PT WBAT RLE      Cristie Hem 01/30/2024, 8:22 AM

## 2024-01-30 NOTE — Discharge Summary (Signed)
 Patient ID: Craig House MRN: [id number] DOB/AGE: 06-30-1961 63 y.o.  Admit date: 01/29/2024 Discharge date: 01/30/2024  Admission Diagnoses:  Principal Problem:   Primary osteoarthritis of right knee Active Problems:   Status post total right knee replacement   Discharge Diagnoses:  Same  Past Medical History:  Diagnosis Date   Allergy    spring   Anemia    Asthma    Diabetes mellitus without complication (HCC)    type II   Hyperplastic colon polyp    Hypertension    Obesity     Surgeries: Procedure(s): RIGHT TOTAL KNEE ARTHROPLASTY on 01/29/2024   Consultants:   Discharged Condition: Improved  Hospital Course: Craig House is an 63 y.o. male who was admitted 01/29/2024 for operative treatment ofPrimary osteoarthritis of right knee. Patient has severe unremitting pain that affects sleep, daily activities, and work/hobbies. After pre-op clearance the patient was taken to the operating room on 01/29/2024 and underwent  Procedure(s): RIGHT TOTAL KNEE ARTHROPLASTY.    Patient was given perioperative antibiotics:  Anti-infectives (From admission, onward)    Start     Dose/Rate Route Frequency Ordered Stop   01/29/24 1530  ceFAZolin (ANCEF) IVPB 2g/100 mL premix        2 g 200 mL/hr over 30 Minutes Intravenous Every 6 hours 01/29/24 1345 01/30/24 0742   01/29/24 1445  doxycycline (VIBRA-TABS) tablet 100 mg       Note to Pharmacy: To be taken after surgery     100 mg Oral 2 times daily 01/29/24 1345     01/29/24 0952  vancomycin (VANCOCIN) powder  Status:  Discontinued          As needed 01/29/24 0953 01/29/24 1101   01/29/24 0721  ceFAZolin (ANCEF) 2-4 GM/100ML-% IVPB       Note to Pharmacy: Gleason, Ginger E: cabinet override      01/29/24 0721 01/29/24 0937   01/29/24 0715  ceFAZolin (ANCEF) IVPB 2g/100 mL premix        2 g 200 mL/hr over 30 Minutes Intravenous On call to O.R. 01/29/24 7829 01/29/24 0942        Patient was given sequential compression  devices, early ambulation, and chemoprophylaxis to prevent DVT.  Patient benefited maximally from hospital stay and there were no complications.    Recent vital signs: Patient Vitals for the past 24 hrs:  BP Temp Temp src Pulse Resp SpO2  01/30/24 0722 121/87 99 F (37.2 C) Oral 91 18 98 %  01/30/24 0345 122/86 98 F (36.7 C) Oral 86 18 100 %  01/29/24 2307 118/67 97.6 F (36.4 C) Oral 81 18 99 %  01/29/24 1958 (!) 145/76 97.6 F (36.4 C) Oral 82 18 100 %  01/29/24 1515 (!) 136/90 97.7 F (36.5 C) Oral 83 20 100 %  01/29/24 1338 120/81 97.6 F (36.4 C) Oral 76 20 98 %  01/29/24 1300 106/73 97.6 F (36.4 C) -- 65 10 97 %  01/29/24 1245 101/74 (!) 96.1 F (35.6 C) -- 68 10 98 %  01/29/24 1230 100/75 (!) 96.1 F (35.6 C) -- 64 10 97 %  01/29/24 1215 104/72 (!) 96.1 F (35.6 C) -- 63 10 96 %  01/29/24 1200 100/70 (!) 96.1 F (35.6 C) -- 62 10 96 %  01/29/24 1145 98/69 (!) 96.1 F (35.6 C) -- 65 10 95 %  01/29/24 1130 90/60 (!) 96.1 F (35.6 C) -- 68 10 94 %  01/29/24 1115 102/75 (!) 96.1 F (35.6  C) -- 73 15 96 %  01/29/24 1103 103/69 (!) 96.1 F (35.6 C) -- 79 14 96 %     Recent laboratory studies: No results for input(s): "WBC", "HGB", "HCT", "PLT", "NA", "K", "CL", "CO2", "BUN", "CREATININE", "GLUCOSE", "INR", "CALCIUM" in the last 72 hours.  Invalid input(s): "PT", "2"   Discharge Medications:   Allergies as of 01/30/2024   No Known Allergies      Medication List     STOP taking these medications    cefadroxil 500 MG capsule Commonly known as: DURICEF   traMADol 50 MG tablet Commonly known as: ULTRAM   TURMERIC PO       TAKE these medications    albuterol 108 (90 Base) MCG/ACT inhaler Commonly known as: VENTOLIN HFA INHALE 2 PUFFS BY MOUTH EVERY 6 HOURS AS NEEDED FOR WHEEZING OR SHORTNESS OF BREATH What changed: See the new instructions.   amLODipine 10 MG tablet Commonly known as: NORVASC TAKE 1 TABLET BY MOUTH EVERY DAY   atorvastatin 20 MG  tablet Commonly known as: LIPITOR TAKE 1 TABLET BY MOUTH EVERY DAY   Cinnamon 500 MG Tabs Take 500 mg by mouth daily.   Dialyvite Vitamin D 5000 125 MCG (5000 UT) capsule Generic drug: Cholecalciferol Take 5,000 Units by mouth every other day.   docusate sodium 100 MG capsule Commonly known as: Colace Take 1 capsule (100 mg total) by mouth daily as needed.   doxycycline 100 MG capsule Commonly known as: Vibramycin Take 1 capsule (100 mg total) by mouth 2 (two) times daily. To be taken after surgery   Eliquis 2.5 MG Tabs tablet Generic drug: apixaban Take 1 tablet (2.5 mg total) by mouth 2 (two) times daily. To be taken after surgery to prevent blood clots   insulin lispro 100 UNIT/ML KwikPen Commonly known as: HumaLOG KwikPen Inject 4 Units into the skin 3 (three) times daily. What changed:  when to take this reasons to take this   Iron-Vitamin C 65-125 MG Tabs Take 1 tablet by mouth daily.   Lumify 0.025 % Soln Generic drug: Brimonidine Tartrate Place 1 drop into both eyes daily as needed (redness).   metFORMIN 500 MG tablet Commonly known as: GLUCOPHAGE TAKE 1 TABLET(500 MG) BY MOUTH TWICE DAILY WITH A MEAL   methocarbamol 750 MG tablet Commonly known as: Robaxin-750 Take 1 tablet (750 mg total) by mouth 2 (two) times daily as needed for muscle spasms. To be taken after surgery   Multi-Vitamins Tabs Take 1 tablet by mouth daily.   ondansetron 4 MG tablet Commonly known as: Zofran Take 1 tablet (4 mg total) by mouth every 8 (eight) hours as needed for nausea or vomiting.   oxyCODONE-acetaminophen 5-325 MG tablet Commonly known as: Percocet Take 1-2 tablets by mouth every 6 (six) hours as needed. To be taken after surgery What changed: Another medication with the same name was removed. Continue taking this medication, and follow the directions you see here.   Ozempic (0.25 or 0.5 MG/DOSE) 2 MG/3ML Sopn Generic drug: Semaglutide(0.25 or 0.5MG /DOS) Inject 0.5  mg into the skin once a week.   triamcinolone cream 0.1 % Commonly known as: KENALOG Apply 1 Application topically 2 (two) times daily.   triamterene-hydrochlorothiazide 37.5-25 MG capsule Commonly known as: DYAZIDE Take 1 each (1 capsule total) by mouth every morning.               Durable Medical Equipment  (From admission, onward)  Start     Ordered   01/29/24 1346  DME Walker rolling  Once       Question Answer Comment  Walker: With 5 Inch Wheels   Patient needs a walker to treat with the following condition Status post left partial knee replacement      01/29/24 1345   01/29/24 1346  DME 3 n 1  Once        01/29/24 1345   01/29/24 1346  DME Bedside commode  Once       Question:  Patient needs a bedside commode to treat with the following condition  Answer:  Status post left partial knee replacement   01/29/24 1345            Diagnostic Studies: DG Knee Right Port Result Date: 01/29/2024 CLINICAL DATA:  Postop in PACU. EXAM: PORTABLE RIGHT KNEE - 1-2 VIEW COMPARISON:  None Available. FINDINGS: Right knee arthroplasty in expected alignment. No periprosthetic lucency or fracture. Recent postsurgical change includes air and edema in the soft tissues and joint space. IMPRESSION: Right knee arthroplasty without immediate postoperative complication. Electronically Signed   By: Narda Rutherford M.D.   On: 01/29/2024 13:39    Disposition: Discharge disposition: 01-Home or Self Care          Follow-up Information     Cristie Hem, PA-C. Schedule an appointment as soon as possible for a visit in 2 week(s).   Specialty: Orthopedic Surgery Contact information: [address] [place] [address] [phone]         [facility] Follow up.   Why: [facility] will contact you for the first home visit Contact information: [phone]                 Signed: Cristie Hem 01/30/2024, 8:23 AM

## 2024-02-02 ENCOUNTER — Telehealth: Payer: Self-pay | Admitting: Orthopaedic Surgery

## 2024-02-02 NOTE — Telephone Encounter (Signed)
 Craig House with [facility] request verbal order for physical therapy: 2 week 1, 3 week 1 & 1 week 1     Craig House's call back number [phone]

## 2024-02-02 NOTE — Telephone Encounter (Signed)
 Called and gave verbal

## 2024-02-07 ENCOUNTER — Other Ambulatory Visit: Payer: Self-pay

## 2024-02-07 ENCOUNTER — Telehealth: Payer: Self-pay | Admitting: Orthopaedic Surgery

## 2024-02-07 ENCOUNTER — Other Ambulatory Visit: Payer: Self-pay | Admitting: Physician Assistant

## 2024-02-07 ENCOUNTER — Telehealth: Payer: Self-pay | Admitting: Physician Assistant

## 2024-02-07 MED ORDER — OXYCODONE-ACETAMINOPHEN 5-325 MG PO TABS: 1.0000 | ORAL_TABLET | Freq: Three times a day (TID) | ORAL | 0 refills | Status: DC | PRN

## 2024-02-07 MED ORDER — DOCUSATE SODIUM 100 MG PO CAPS: 100.0000 mg | ORAL_CAPSULE | Freq: Every day | ORAL | 2 refills | Status: DC | PRN

## 2024-02-07 MED ORDER — METHOCARBAMOL 750 MG PO TABS: 750.0000 mg | ORAL_TABLET | Freq: Two times a day (BID) | ORAL | 2 refills | Status: DC | PRN

## 2024-02-07 NOTE — Telephone Encounter (Signed)
 Notified patient.

## 2024-02-07 NOTE — Telephone Encounter (Signed)
 Instructions have changed to q6 hrs prn to q8 prn

## 2024-02-07 NOTE — Telephone Encounter (Signed)
 Pt called to refill prescription. Oxycodone-acetaminophen to [facility] on [address] [place], [place], [place] [address]

## 2024-02-07 NOTE — Telephone Encounter (Signed)
 Refilled robaxin and colace.  No need to refill abx once he has finished the bottle

## 2024-02-07 NOTE — Telephone Encounter (Signed)
 Patient called and ask if he can get a refill on the muscle relaxer, and stool softner and the antibiotic. [phone]

## 2024-02-13 ENCOUNTER — Ambulatory Visit (INDEPENDENT_AMBULATORY_CARE_PROVIDER_SITE_OTHER): Payer: Managed Care, Other (non HMO) | Admitting: Physician Assistant

## 2024-02-13 DIAGNOSIS — Z96651 Presence of right artificial knee joint: Secondary | ICD-10-CM

## 2024-02-13 MED ORDER — OXYCODONE-ACETAMINOPHEN 5-325 MG PO TABS: 1.0000 | ORAL_TABLET | Freq: Three times a day (TID) | ORAL | 0 refills | Status: DC | PRN

## 2024-02-13 MED ORDER — METHOCARBAMOL 750 MG PO TABS: 750.0000 mg | ORAL_TABLET | Freq: Two times a day (BID) | ORAL | 2 refills | Status: DC | PRN

## 2024-02-13 NOTE — Progress Notes (Signed)
 Post-Op Visit Note   Patient: Craig House           Date of Birth: 12-Nov-1961           MRN: [id number] Visit Date: 02/13/2024 PCP: Esperanza Richters, PA-C   Assessment & Plan:  Chief Complaint:  Chief Complaint  Patient presents with   Right Knee - Routine Post Op   Visit Diagnoses:  1. Status post total right knee replacement     Plan: Patient is a very pleasant 63 year old gentleman who comes in today 2 weeks status post right total knee replacement, date of surgery 01/29/2024.  He has been doing well.  He is taking Robaxin and Percocet for pain.  He has been compliant taking Eliquis for DVT prophylaxis.  He is getting home health physical therapy.  Examination of the right knee reveals a well-healing surgical incision with nylon sutures in place.  No evidence of infection or cellulitis.  Calf soft nontender.  He is neurovascularly intact distally.  Today, sutures were removed and Steri-Strips applied.  I sent an outpatient referral for PT.  I have refilled his oxycodone and Robaxin.  He will continue with his Eliquis until he has finished the bottle which should be about 4 weeks postop.  He will then begin taking a baby aspirin twice daily for 2 weeks.  Follow-up with [place] in 4 weeks for repeat evaluation and 2 view x-rays of the right knee.  Call with concerns or questions.  Follow-Up Instructions: Return in about 4 weeks (around 03/12/2024).   Orders:  Orders Placed This Encounter  Procedures   Ambulatory referral to Physical Therapy   Meds ordered this encounter  Medications   methocarbamol (ROBAXIN-750) 750 MG tablet    Sig: Take 1 tablet (750 mg total) by mouth 2 (two) times daily as needed for muscle spasms. To be taken after surgery    Dispense:  20 tablet    Refill:  2   oxyCODONE-acetaminophen (PERCOCET) 5-325 MG tablet    Sig: Take 1-2 tablets by mouth every 8 (eight) hours as needed. To be taken after surgery    Dispense:  40 tablet    Refill:  0     Imaging: No results found.  PMFS History: Patient Active Problem List   Diagnosis Date Noted   Status post total right knee replacement 01/29/2024   Primary osteoarthritis of right knee 12/26/2023   Status post total left knee replacement 01/09/2023   Bilateral primary osteoarthritis of knee 06/19/2018   Mild intermittent asthma, uncomplicated 01/18/2016   HTN (hypertension) 12/31/2011   Past Medical History:  Diagnosis Date   Allergy    spring   Anemia    Asthma    Diabetes mellitus without complication (HCC)    type II   Hyperplastic colon polyp    Hypertension    Obesity     Family History  Problem Relation Age of Onset   Heart attack Mother    Cancer Father    Heart disease Father    Colon cancer Neg Hx     Past Surgical History:  Procedure Laterality Date   KNEE ARTHROSCOPY Bilateral    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   Procedure: LEFT TOTAL KNEE ARTHROPLASTY;  Surgeon: Tarry Kos, MD;  Location: [facility] OR;  Service: Orthopedics;  Laterality: Left;   TOTAL KNEE ARTHROPLASTY Right 01/29/2024   Procedure: RIGHT TOTAL KNEE ARTHROPLASTY;  Surgeon: Tarry Kos, MD;  Location: [facility] OR;  Service: Orthopedics;  Laterality:  Right;   Social History   Occupational History   Not on file  Tobacco Use   Smoking status: Former   Smokeless tobacco: Never  Vaping Use   Vaping status: Never Used  Substance and Sexual Activity   Alcohol use: Yes    Alcohol/week: 1.0 standard drink of alcohol    Types: 1 Standard drinks or equivalent per week    Comment: 1 glass of red wine at night   Drug use: No   Sexual activity: Yes

## 2024-02-19 NOTE — Therapy (Signed)
 OUTPATIENT PHYSICAL THERAPY EVALUATION   Patient Name: Craig House MRN: [id number] DOB:1961-07-12, 63 y.o., male Today's Date: 02/20/2024  END OF SESSION:  PT End of Session - 02/20/24 1341     Visit Number 1    Number of Visits 20    Date for PT Re-Evaluation 04/30/24    Authorization Type [employer]    PT Start Time 1342    PT Stop Time 1427    PT Time Calculation (min) 45 min    Activity Tolerance Patient tolerated treatment well    Behavior During Therapy [facility] for tasks assessed/performed             Past Medical History:  Diagnosis Date   Allergy    spring   Anemia    Asthma    Diabetes mellitus without complication (HCC)    type II   Hyperplastic colon polyp    Hypertension    Obesity    Past Surgical History:  Procedure Laterality Date   KNEE ARTHROSCOPY Bilateral    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   Procedure: LEFT TOTAL KNEE ARTHROPLASTY;  Surgeon: Tarry Kos, MD;  Location: [facility] OR;  Service: Orthopedics;  Laterality: Left;   TOTAL KNEE ARTHROPLASTY Right 01/29/2024   Procedure: RIGHT TOTAL KNEE ARTHROPLASTY;  Surgeon: Tarry Kos, MD;  Location: [facility] OR;  Service: Orthopedics;  Laterality: Right;   Patient Active Problem List   Diagnosis Date Noted   Status post total right knee replacement 01/29/2024   Primary osteoarthritis of right knee 12/26/2023   Status post total left knee replacement 01/09/2023   Bilateral primary osteoarthritis of knee 06/19/2018   Mild intermittent asthma, uncomplicated 01/18/2016   HTN (hypertension) 12/31/2011    PCP: Esperanza Richters, PA - C  REFERRING PROVIDER: Cristie Hem, PA-C  REFERRING DIAG: [phone] (ICD-10-CM) - Status post total right knee replacement  THERAPY DIAG:  Muscle weakness (generalized)  Chronic pain of right knee  Stiffness of right knee, not elsewhere classified  Difficulty in walking, not elsewhere classified  Localized edema  Rationale for Evaluation and Treatment:  Rehabilitation  ONSET DATE: 01/29/2024 surgery  SUBJECTIVE:   SUBJECTIVE STATEMENT: Had surgery on 01/29/2024 with home health until last week.  Pt indicated trouble with sleeping and staying still/getting comfortable.  Has walker and SPC, using both at this time.  Trying to sleep in bed.  Pt indicated just wanted to get things better like the other one did.    PERTINENT HISTORY:  PMH includes anemia, asthma, DM, HTN and Lt TKA 2024.  PAIN:  NPRS scale: current 7/10, at worst 9-10/10 Pain location: Rt knee  Pain description: sharp pain, ache/tightness.  Aggravating factors: static positioning, end ranges, WB pressure.  Relieving factors: medicine, ice  PRECAUTIONS: None  WEIGHT BEARING RESTRICTIONS: No  FALLS:  Has patient fallen in last 6 months? 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  Waist to waist lifting.   PLOF: Independent, walking for exercise, previous bike riding.  [employer]  PATIENT GOALS: Reduce pain, get back to work and activity.   OBJECTIVE:   PATIENT SURVEYS:  Patient-Specific Activity Scoring Scheme  "0" represents "unable to perform." "10" represents "able to perform at prior level. 0 1 2 3 4 5 6 7 8 9  10 (Date and Score)  Activity Eval  02/20/2024    1. Stairs  2     2. standing 4    3. showering 4   4.  Prolonged static positioning (sleeping, sitting) 2   5.    Score 3 avg    Total score = sum of the activity scores/number of activities Minimum detectable change (90%CI) for average score = 2 points Minimum detectable change (90%CI) for single activity score = 3 points  COGNITION: 02/20/2024 Overall cognitive status: WFL    SENSATION: 02/20/2024 Not tested  EDEMA:  02/20/2024 Localized edema noted in Rt knee/lower  leg   MUSCLE LENGTH: 02/20/2024 No specific testing  POSTURE:  02/20/2024 Nothing specific noted impacting Rt knee other than weight shift towards Lt in standing.   PALPATION: 02/20/2024 Mild tenderness anterior knee joint, incision area.   LOWER EXTREMITY ROM:   ROM Right Eval 02/20/2024 Left Eval 02/20/2024  Hip flexion    Hip extension    Hip abduction    Hip adduction    Hip internal rotation    Hip external rotation    Knee flexion 109 AROM in supine heel slide    Knee extension -31 AROM in seated LAQ  Supine heel prop: -11    Ankle dorsiflexion    Ankle plantarflexion    Ankle inversion    Ankle eversion     (Blank rows = not tested)  LOWER EXTREMITY MMT:  MMT Right Eval 02/20/2024 Left Eval 02/20/2024  Hip flexion 5/5 5/5  Hip extension    Hip abduction    Hip adduction    Hip internal rotation    Hip external rotation    Knee flexion 4/5 5/5  Knee extension 3+/5 5/5  Ankle dorsiflexion 5/5 5/5  Ankle plantarflexion    Ankle inversion    Ankle eversion     (Blank rows = not tested)  LOWER EXTREMITY SPECIAL TESTS:  02/20/2024 No specific testing today  FUNCTIONAL TESTS:  02/20/2024 18 inch chair transfer: able to perform without UE assist after several tries with deviation to Lt LE Lt SLS: 10 seconds Rt SLS: unable unassisted.   GAIT: 02/20/2024 Distance walked: household distances  Assistive device utilized: SPC use in Lt UE Level of assistance: Modified independence Comments: reduced stance on Rt leg, maintained knee flexion                                                                                                                                                                         TODAY'S TREATMENT  DATE: 02/20/2024 Therex:    HEP instruction/performance c cues for techniques, handout provided.  Trial set performed of each for comprehension and symptom assessment.  See  below for exercise list Nustep lvl 6 UE/LE for knee ROM 8 mins  Vaso 10 mins Rt knee medium compression in elevation 34 deg.   PATIENT EDUCATION:  02/20/2024 Education details: HEP, POC Person educated: Patient Education method: Programmer, multimedia, Demonstration, Verbal cues, and Handouts Education comprehension: verbalized understanding, returned demonstration, and verbal cues required  HOME EXERCISE PROGRAM: Access Code: [email or web address] URL: [email or web address] Date: 02/20/2024 Prepared by: Chyrel Masson  Exercises - Supine Heel Slide  - 2-3 x daily - 7 x weekly - 1 sets - 10 reps - 5 hold - Supine Quadricep Sets  - 2-3 x daily - 7 x weekly - 1 sets - 10 reps - 5 hold - Seated Long Arc Quad  - 2-3 x daily - 7 x weekly - 1 sets - 5-10 reps - 2 hold - Supine Knee Extension Mobilization with Weight  - 2-3 x daily - 7 x weekly - 1 sets - 1 reps - to tolerance up to 5 mins hold - Seated Knee Extension Stretch with Chair  - 2-3 x daily - 7 x weekly - 1 sets - 1 reps - 3-5 mins hold - Seated Quad Set  - 2-3 x daily - 7 x weekly - 1 sets - 10 reps - 5 hold - Seated Calf Stretch with Strap  - 2-3 x daily - 7 x weekly - 1 sets - 3-5 reps - 30 hold  ASSESSMENT:  CLINICAL IMPRESSION: Patient is a 63 y.o. who comes to clinic with complaints of Rt knee pain s/p recent TKA on 01/29/2024 with mobility, strength and movement coordination deficits that impair their ability to perform usual daily and recreational functional activities without increase difficulty/symptoms at this time.  Patient to benefit from skilled PT services to address impairments and limitations to improve to previous level of function without restriction secondary to condition.   OBJECTIVE IMPAIRMENTS: Abnormal gait, decreased activity tolerance, decreased balance, decreased coordination, decreased endurance, decreased mobility, difficulty walking, decreased ROM, decreased strength, hypomobility, increased edema, increased  fascial restrictions, impaired perceived functional ability, increased muscle spasms, impaired flexibility, improper body mechanics, and pain.   ACTIVITY LIMITATIONS: lifting, bending, sitting, standing, squatting, sleeping, stairs, transfers, bed mobility, and locomotion level  PARTICIPATION LIMITATIONS: meal prep, cleaning, laundry, interpersonal relationship, driving, shopping, community activity, and occupation  PERSONAL FACTORS:   PMH includes anemia, asthma, DM, HTN and Lt TKA 2024.  are also affecting patient's functional outcome.   REHAB POTENTIAL: Good  CLINICAL DECISION MAKING: Evolving/moderate complexity  EVALUATION COMPLEXITY: Moderate   GOALS: Goals reviewed with patient? Yes  SHORT TERM GOALS: (target date for Short term goals are 3 weeks 03/12/2024)   1.  Patient will demonstrate independent use of home exercise program to maintain progress from in clinic treatments.  Goal status: New  LONG TERM GOALS: (target dates for all long term goals are 10 weeks  04/30/2024 )   1. Patient will demonstrate/report pain at worst less than or equal to 2/10 to facilitate minimal limitation in daily activity secondary to pain symptoms.  Goal status: New   2. Patient will demonstrate independent use of home exercise program to facilitate ability to maintain/progress functional gains from skilled physical therapy services.  Goal status: New   3. Patient will demonstrate Patient specific functional scale avg > or = 8/10 to indicate reduced disability due to  condition.   Goal status: New   4.  Patient will demonstrate Rt LE MMT 5/5 throughout to faciltiate usual transfers, stairs, squatting at [facility] for daily life.   Goal status: New   5.  Patient will demonstrate Rt knee AROM 0-110 deg to facilitate transfers and other daily mobility at PLOF.  Goal status: New   6.  Patient will demonstrate independent ambulation > 500 ft s deviation for community integration. Goal status:  New   7.  Patient will demonstrate ascending/descending stairs reciprocally s UE assist for community integration.   Goal Status: New   PLAN:  PT FREQUENCY: 1-2x/week  PT DURATION: 10 weeks  PLANNED INTERVENTIONS: Can include 65784- PT Re-evaluation, 97110-Therapeutic exercises, 97530- Therapeutic activity, 97112- Neuromuscular re-education, 97535- Self Care, 97140- Manual therapy, [phone]- Gait training, [phone]- Orthotic Fit/training, [phone]- Canalith repositioning, [id number]- Aquatic Therapy, [phone]- Electrical stimulation (unattended), [phone]- Electrical stimulation (manual), [id number] Physical performance testing, 97016- Vasopneumatic device, [id number]- Ultrasound, [id number]- Traction (mechanical), [id number]- Ionotophoresis 4mg /ml Dexamethasone, Patient/Family education, Balance training, Stair training, Taping, Dry Needling, Joint mobilization, Joint manipulation, Spinal manipulation, Spinal mobilization, Scar mobilization, Vestibular training, Visual/preceptual remediation/compensation, DME instructions, Cryotherapy, and Moist heat.  All performed as medically necessary.  All included unless contraindicated  PLAN FOR NEXT SESSION: Review HEP knowledge/results. Extension gains, early strengthening.    Chyrel Masson, PT, DPT, OCS, ATC 02/20/24  2:21 PM

## 2024-02-20 ENCOUNTER — Ambulatory Visit (INDEPENDENT_AMBULATORY_CARE_PROVIDER_SITE_OTHER): Admitting: Rehabilitative and Restorative Service Providers"

## 2024-02-20 ENCOUNTER — Encounter: Payer: Self-pay | Admitting: Rehabilitative and Restorative Service Providers"

## 2024-02-20 DIAGNOSIS — M25561 Pain in right knee: Secondary | ICD-10-CM | POA: Diagnosis not present

## 2024-02-20 DIAGNOSIS — M6281 Muscle weakness (generalized): Secondary | ICD-10-CM

## 2024-02-20 DIAGNOSIS — M25661 Stiffness of right knee, not elsewhere classified: Secondary | ICD-10-CM | POA: Diagnosis not present

## 2024-02-20 DIAGNOSIS — R262 Difficulty in walking, not elsewhere classified: Secondary | ICD-10-CM | POA: Diagnosis not present

## 2024-02-20 DIAGNOSIS — R600 Localized edema: Secondary | ICD-10-CM

## 2024-02-20 DIAGNOSIS — G8929 Other chronic pain: Secondary | ICD-10-CM

## 2024-02-22 ENCOUNTER — Ambulatory Visit: Admitting: Physical Therapy

## 2024-02-22 ENCOUNTER — Encounter: Payer: Self-pay | Admitting: Physical Therapy

## 2024-02-22 DIAGNOSIS — M25661 Stiffness of right knee, not elsewhere classified: Secondary | ICD-10-CM

## 2024-02-22 DIAGNOSIS — R262 Difficulty in walking, not elsewhere classified: Secondary | ICD-10-CM | POA: Diagnosis not present

## 2024-02-22 DIAGNOSIS — M6281 Muscle weakness (generalized): Secondary | ICD-10-CM | POA: Diagnosis not present

## 2024-02-22 DIAGNOSIS — G8929 Other chronic pain: Secondary | ICD-10-CM

## 2024-02-22 DIAGNOSIS — R600 Localized edema: Secondary | ICD-10-CM

## 2024-02-22 DIAGNOSIS — M25561 Pain in right knee: Secondary | ICD-10-CM | POA: Diagnosis not present

## 2024-02-22 NOTE — Therapy (Signed)
 OUTPATIENT PHYSICAL THERAPY TREATMENT   Patient Name: Craig House MRN: [id number] DOB:09/28/1961, 63 y.o., male Today's Date: 02/22/2024  END OF SESSION:  PT End of Session - 02/22/24 1138     Visit Number 2    Number of Visits 20    Date for PT Re-Evaluation 04/30/24    Authorization Type [employer]    PT Start Time 1059    PT Stop Time 1145    PT Time Calculation (min) 46 min    Activity Tolerance Patient tolerated treatment well    Behavior During Therapy [facility] for tasks assessed/performed              Past Medical History:  Diagnosis Date   Allergy    spring   Anemia    Asthma    Diabetes mellitus without complication (HCC)    type II   Hyperplastic colon polyp    Hypertension    Obesity    Past Surgical History:  Procedure Laterality Date   KNEE ARTHROSCOPY Bilateral    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   Procedure: LEFT TOTAL KNEE ARTHROPLASTY;  Surgeon: Tarry Kos, MD;  Location: [facility] OR;  Service: Orthopedics;  Laterality: Left;   TOTAL KNEE ARTHROPLASTY Right 01/29/2024   Procedure: RIGHT TOTAL KNEE ARTHROPLASTY;  Surgeon: Tarry Kos, MD;  Location: [facility] OR;  Service: Orthopedics;  Laterality: Right;   Patient Active Problem List   Diagnosis Date Noted   Status post total right knee replacement 01/29/2024   Primary osteoarthritis of right knee 12/26/2023   Status post total left knee replacement 01/09/2023   Bilateral primary osteoarthritis of knee 06/19/2018   Mild intermittent asthma, uncomplicated 01/18/2016   HTN (hypertension) 12/31/2011    PCP: Esperanza Richters, PA - C  REFERRING PROVIDER: Cristie Hem, PA-C  REFERRING DIAG: [id number] (ICD-10-CM) - Status post total right knee replacement  THERAPY DIAG:  Chronic pain of right knee  Stiffness of right knee, not elsewhere classified  Muscle weakness (generalized)  Difficulty in walking, not elsewhere classified  Localized edema  Rationale for Evaluation and Treatment:  Rehabilitation  ONSET DATE: 01/29/2024 surgery  SUBJECTIVE:   SUBJECTIVE STATEMENT: He relays he took pain meds before coming to try to get ahead of the pain. He relays compliance with HEP and had no questions about them   PERTINENT HISTORY:  PMH includes anemia, asthma, DM, HTN and Lt TKA 2024.  PAIN:  NPRS scale: current 7/10 Pain location: Rt knee  Pain description: sharp pain, ache/tightness.  Aggravating factors: static positioning, end ranges, WB pressure.  Relieving factors: medicine, ice  PRECAUTIONS: None  WEIGHT BEARING RESTRICTIONS: No  FALLS:  Has patient fallen in last 6 months? No  LIVING ENVIRONMENT: Lives with:  girlfriend Lives in: House/apartment Stairs: flight to bedroom, stairs to enter house with handrail Has following equipment at home: [facility], FWW  OCCUPATION: [employer].  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  Waist to waist lifting.   PLOF: Independent, walking for exercise, previous bike riding.  [employer]  PATIENT GOALS: Reduce pain, get back to work and activity.   OBJECTIVE:   PATIENT SURVEYS:  Patient-Specific Activity Scoring Scheme  "0" represents "unable to perform." "10" represents "able to perform at prior level. 0 1 2 3 4 5 6 7 8 9  10 (Date and Score)   Activity Eval  02/20/2024    1. Stairs  2     2. standing 4    3. showering 4  4.  Prolonged static positioning (sleeping, sitting) 2   5.    Score 3 avg    Total score = sum of the activity scores/number of activities Minimum detectable change (90%CI) for average score = 2 points Minimum detectable change (90%CI) for single activity score = 3 points  COGNITION: 02/20/2024 Overall cognitive status: WFL    SENSATION: 02/20/2024 Not tested  EDEMA:  02/20/2024 Localized edema noted in Rt knee/lower leg   MUSCLE LENGTH: 02/20/2024 No specific testing  POSTURE:  02/20/2024 Nothing specific noted impacting Rt knee other than weight shift towards Lt in  standing.   PALPATION: 02/20/2024 Mild tenderness anterior knee joint, incision area.   LOWER EXTREMITY ROM:   ROM Right Eval 02/20/2024 Left Eval 02/20/2024  Hip flexion    Hip extension    Hip abduction    Hip adduction    Hip internal rotation    Hip external rotation    Knee flexion 109 AROM in supine heel slide    Knee extension -31 AROM in seated LAQ  Supine heel prop: -11    Ankle dorsiflexion    Ankle plantarflexion    Ankle inversion    Ankle eversion     (Blank rows = not tested)  LOWER EXTREMITY MMT:  MMT Right Eval 02/20/2024 Left Eval 02/20/2024  Hip flexion 5/5 5/5  Hip extension    Hip abduction    Hip adduction    Hip internal rotation    Hip external rotation    Knee flexion 4/5 5/5  Knee extension 3+/5 5/5  Ankle dorsiflexion 5/5 5/5  Ankle plantarflexion    Ankle inversion    Ankle eversion     (Blank rows = not tested)  LOWER EXTREMITY SPECIAL TESTS:  02/20/2024 No specific testing today  FUNCTIONAL TESTS:  02/20/2024 18 inch chair transfer: able to perform without UE assist after several tries with deviation to Lt LE Lt SLS: 10 seconds Rt SLS: unable unassisted.   GAIT: 02/20/2024 Distance walked: household distances  Assistive device utilized: SPC use in Lt UE Level of assistance: Modified independence Comments: reduced stance on Rt leg, maintained knee flexion                                                                                                                                                                        TODAY'S TREATMENT  DATE: 02/22/2024 Therex: Nustep lvl 6 UE/LE for knee ROM 11 min for knee ROM Seated knee flexion AAROM with LAQ in between 5 sec hold X 10 Seated LAQ 2# 3X10 Seated hamstring stretch 30 sec X 3 Supine heel prop with quad sets 5 sec X 10 Supine heel prop TKE stretch first 3 min of vaso  Manual: Rt knee extension stretching  with overpressure and extension mobs grade 3 in supine  Vaso 10 mins Rt knee medium compression in elevation 34 deg.   TODAY'S TREATMENT                                                                          DATE: 02/20/2024 Therex:    HEP instruction/performance c cues for techniques, handout provided.  Trial set performed of each for comprehension and symptom assessment.  See below for exercise list Nustep lvl 6 UE/LE for knee ROM 8 mins  Vaso 10 mins Rt knee medium compression in elevation 34 deg.   PATIENT EDUCATION:  02/20/2024 Education details: HEP, POC Person educated: Patient Education method: Programmer, multimedia, Demonstration, Verbal cues, and Handouts Education comprehension: verbalized understanding, returned demonstration, and verbal cues required  HOME EXERCISE PROGRAM: Access Code: [email or web address] URL: [email or web address] Date: 02/20/2024 Prepared by: Chyrel Masson  Exercises - Supine Heel Slide  - 2-3 x daily - 7 x weekly - 1 sets - 10 reps - 5 hold - Supine Quadricep Sets  - 2-3 x daily - 7 x weekly - 1 sets - 10 reps - 5 hold - Seated Long Arc Quad  - 2-3 x daily - 7 x weekly - 1 sets - 5-10 reps - 2 hold - Supine Knee Extension Mobilization with Weight  - 2-3 x daily - 7 x weekly - 1 sets - 1 reps - to tolerance up to 5 mins hold - Seated Knee Extension Stretch with Chair  - 2-3 x daily - 7 x weekly - 1 sets - 1 reps - 3-5 mins hold - Seated Quad Set  - 2-3 x daily - 7 x weekly - 1 sets - 10 reps - 5 hold - Seated Calf Stretch with Strap  - 2-3 x daily - 7 x weekly - 1 sets - 3-5 reps - 30 hold  ASSESSMENT:  CLINICAL IMPRESSION: His extension ROM looked better today compared to Eval measurements. He will need continued work to get his knee straight. He showed good understanding of HEP.  OBJECTIVE IMPAIRMENTS: Abnormal gait, decreased activity tolerance, decreased balance, decreased coordination, decreased endurance, decreased mobility, difficulty walking,  decreased ROM, decreased strength, hypomobility, increased edema, increased fascial restrictions, impaired perceived functional ability, increased muscle spasms, impaired flexibility, improper body mechanics, and pain.   ACTIVITY LIMITATIONS: lifting, bending, sitting, standing, squatting, sleeping, stairs, transfers, bed mobility, and locomotion level  PARTICIPATION LIMITATIONS: meal prep, cleaning, laundry, interpersonal relationship, driving, shopping, community activity, and occupation  PERSONAL FACTORS:   PMH includes anemia, asthma, DM, HTN and Lt TKA 2024.  are also affecting patient's functional outcome.   REHAB POTENTIAL: Good  CLINICAL DECISION MAKING: Evolving/moderate complexity  EVALUATION COMPLEXITY: Moderate   GOALS: Goals reviewed with patient? Yes  SHORT TERM GOALS: (target date for Short term goals are 3 weeks 03/12/2024)  1.  Patient will demonstrate independent use of home exercise program to maintain progress from in clinic treatments.  Goal status: New  LONG TERM GOALS: (target dates for all long term goals are 10 weeks  04/30/2024 )   1. Patient will demonstrate/report pain at worst less than or equal to 2/10 to facilitate minimal limitation in daily activity secondary to pain symptoms.  Goal status: New   2. Patient will demonstrate independent use of home exercise program to facilitate ability to maintain/progress functional gains from skilled physical therapy services.  Goal status: New   3. Patient will demonstrate Patient specific functional scale avg > or = 8/10 to indicate reduced disability due to condition.   Goal status: New   4.  Patient will demonstrate Rt LE MMT 5/5 throughout to faciltiate usual transfers, stairs, squatting at [facility] for daily life.   Goal status: New   5.  Patient will demonstrate Rt knee AROM 0-110 deg to facilitate transfers and other daily mobility at PLOF.  Goal status: New   6.  Patient will demonstrate independent  ambulation > 500 ft s deviation for community integration. Goal status: New   7.  Patient will demonstrate ascending/descending stairs reciprocally s UE assist for community integration.   Goal Status: New   PLAN:  PT FREQUENCY: 1-2x/week  PT DURATION: 10 weeks  PLANNED INTERVENTIONS: Can include 09811- PT Re-evaluation, 97110-Therapeutic exercises, 97530- Therapeutic activity, 97112- Neuromuscular re-education, 97535- Self Care, 97140- Manual therapy, [phone]- Gait training, [phone]- Orthotic Fit/training, [id number]- Canalith repositioning, [id number]- Aquatic Therapy, [phone]- Electrical stimulation (unattended), [phone]- Electrical stimulation (manual), [id number] Physical performance testing, 97016- Vasopneumatic device, [id number]- Ultrasound, [id number]- Traction (mechanical), [id number]- Ionotophoresis 4mg /ml Dexamethasone, Patient/Family education, Balance training, Stair training, Taping, Dry Needling, Joint mobilization, Joint manipulation, Spinal manipulation, Spinal mobilization, Scar mobilization, Vestibular training, Visual/preceptual remediation/compensation, DME instructions, Cryotherapy, and Moist heat.  All performed as medically necessary.  All included unless contraindicated  PLAN FOR NEXT SESSION:  Extension gains, early strengthening.   Ivery Quale, PT, DPT 02/22/24 11:43 AM

## 2024-02-27 ENCOUNTER — Ambulatory Visit (INDEPENDENT_AMBULATORY_CARE_PROVIDER_SITE_OTHER): Admitting: Physical Therapy

## 2024-02-27 ENCOUNTER — Encounter: Payer: Self-pay | Admitting: Physical Therapy

## 2024-02-27 DIAGNOSIS — M25561 Pain in right knee: Secondary | ICD-10-CM | POA: Diagnosis not present

## 2024-02-27 DIAGNOSIS — M25661 Stiffness of right knee, not elsewhere classified: Secondary | ICD-10-CM | POA: Diagnosis not present

## 2024-02-27 DIAGNOSIS — M6281 Muscle weakness (generalized): Secondary | ICD-10-CM

## 2024-02-27 DIAGNOSIS — R262 Difficulty in walking, not elsewhere classified: Secondary | ICD-10-CM

## 2024-02-27 DIAGNOSIS — R600 Localized edema: Secondary | ICD-10-CM

## 2024-02-27 DIAGNOSIS — G8929 Other chronic pain: Secondary | ICD-10-CM

## 2024-02-27 NOTE — Therapy (Signed)
 OUTPATIENT PHYSICAL THERAPY TREATMENT   Patient Name: Craig House MRN: [id number] DOB:December 23, 1960, 63 y.o., male Today's Date: 02/27/2024  END OF SESSION:  PT End of Session - 02/27/24 1211     Visit Number 3    Number of Visits 20    Date for PT Re-Evaluation 04/30/24    Authorization Type [employer]    PT Start Time 1145    PT Stop Time 1233    PT Time Calculation (min) 48 min    Activity Tolerance Patient tolerated treatment well    Behavior During Therapy [facility] for tasks assessed/performed               Past Medical History:  Diagnosis Date   Allergy    spring   Anemia    Asthma    Diabetes mellitus without complication (HCC)    type II   Hyperplastic colon polyp    Hypertension    Obesity    Past Surgical History:  Procedure Laterality Date   KNEE ARTHROSCOPY Bilateral    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   Procedure: LEFT TOTAL KNEE ARTHROPLASTY;  Surgeon: Tarry Kos, MD;  Location: [facility] OR;  Service: Orthopedics;  Laterality: Left;   TOTAL KNEE ARTHROPLASTY Right 01/29/2024   Procedure: RIGHT TOTAL KNEE ARTHROPLASTY;  Surgeon: Tarry Kos, MD;  Location: [facility] OR;  Service: Orthopedics;  Laterality: Right;   Patient Active Problem List   Diagnosis Date Noted   Status post total right knee replacement 01/29/2024   Primary osteoarthritis of right knee 12/26/2023   Status post total left knee replacement 01/09/2023   Bilateral primary osteoarthritis of knee 06/19/2018   Mild intermittent asthma, uncomplicated 01/18/2016   HTN (hypertension) 12/31/2011    PCP: Esperanza Richters, PA - C  REFERRING PROVIDER: Cristie Hem, PA-C  REFERRING DIAG: [phone] (ICD-10-CM) - Status post total right knee replacement  THERAPY DIAG:  Chronic pain of right knee  Stiffness of right knee, not elsewhere classified  Muscle weakness (generalized)  Difficulty in walking, not elsewhere classified  Localized edema  Rationale for Evaluation and Treatment:  Rehabilitation  ONSET DATE: 01/29/2024 surgery  SUBJECTIVE:   SUBJECTIVE STATEMENT: 6/10 knee pain reported.  PERTINENT HISTORY:  PMH includes anemia, asthma, DM, HTN and Lt TKA 2024.  PAIN:  NPRS scale: current 6/10 Pain location: Rt knee  Pain description: sharp pain, ache/tightness.  Aggravating factors: static positioning, end ranges, WB pressure.  Relieving factors: medicine, ice  PRECAUTIONS: None  WEIGHT BEARING RESTRICTIONS: No  FALLS:  Has patient fallen in last 6 months? No  LIVING ENVIRONMENT: Lives with:  girlfriend Lives in: House/apartment Stairs: flight to bedroom, stairs to enter house with handrail Has following equipment at home: [facility], FWW  OCCUPATION: [employer].  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  Waist to waist lifting.   PLOF: Independent, walking for exercise, previous bike riding.  [employer]  PATIENT GOALS: Reduce pain, get back to work and activity.   OBJECTIVE:   PATIENT SURVEYS:  Patient-Specific Activity Scoring Scheme  "0" represents "unable to perform." "10" represents "able to perform at prior level. 0 1 2 3 4 5 6 7 8 9  10 (Date and Score)   Activity Eval  02/20/2024    1. Stairs  2     2. standing 4    3. showering 4   4.  Prolonged static positioning (sleeping, sitting) 2   5.    Score 3 avg    Total  score = sum of the activity scores/number of activities Minimum detectable change (90%CI) for average score = 2 points Minimum detectable change (90%CI) for single activity score = 3 points  COGNITION: 02/20/2024 Overall cognitive status: WFL    SENSATION: 02/20/2024 Not tested  EDEMA:  02/20/2024 Localized edema noted in Rt knee/lower leg   MUSCLE LENGTH: 02/20/2024 No specific testing  POSTURE:  02/20/2024 Nothing specific noted impacting Rt knee other than weight shift towards Lt in standing.   PALPATION: 02/20/2024 Mild tenderness anterior knee joint, incision area.   LOWER EXTREMITY  ROM:   ROM Right Eval 02/20/2024 Left Eval 02/20/2024  Hip flexion    Hip extension    Hip abduction    Hip adduction    Hip internal rotation    Hip external rotation    Knee flexion 109 AROM in supine heel slide    Knee extension -31 AROM in seated LAQ  Supine heel prop: -11    Ankle dorsiflexion    Ankle plantarflexion    Ankle inversion    Ankle eversion     (Blank rows = not tested)  LOWER EXTREMITY MMT:  MMT Right Eval 02/20/2024 Left Eval 02/20/2024  Hip flexion 5/5 5/5  Hip extension    Hip abduction    Hip adduction    Hip internal rotation    Hip external rotation    Knee flexion 4/5 5/5  Knee extension 3+/5 5/5  Ankle dorsiflexion 5/5 5/5  Ankle plantarflexion    Ankle inversion    Ankle eversion     (Blank rows = not tested)  LOWER EXTREMITY SPECIAL TESTS:  02/20/2024 No specific testing today  FUNCTIONAL TESTS:  02/20/2024 18 inch chair transfer: able to perform without UE assist after several tries with deviation to Lt LE Lt SLS: 10 seconds Rt SLS: unable unassisted.   GAIT: 02/20/2024 Distance walked: household distances  Assistive device utilized: SPC use in Lt UE Level of assistance: Modified independence Comments: reduced stance on Rt leg, maintained knee flexion   TODAY'S TREATMENT                                                                          DATE: 02/27/2024 Precor bike seat #8 X 10 min for knee ROM Leg press DL 16# 1W96, then Rt leg only 25# 2X10 Seated knee flexion AAROM with LAQ in between 5 sec hold X 10 Seated LAQ 3# 3X10 Seated hamstring stretch 30 sec X 3 Supine heel prop with quad sets 5 sec X 10 Supine heel prop TKE stretch first 3 min of vaso  Manual: Rt knee extension stretching with overpressure and extension mobs grade 3 in supine  Vaso 10 mins Rt knee medium compression in elevation 34 deg.  TODAY'S TREATMENT                                                                          DATE: 02/22/2024 Therex: Nustep lvl 6 UE/LE for knee ROM 11 min for knee ROM Seated knee flexion AAROM with LAQ in between 5 sec hold X 10 Seated LAQ 2# 3X10 Seated hamstring stretch 30 sec X 3 Supine heel prop with quad sets 5 sec X 10 Supine heel prop TKE stretch first 3 min of vaso  Manual: Rt knee extension stretching with overpressure and extension mobs grade 3 in supine  Vaso 10 mins Rt knee medium compression in elevation 34 deg.   TODAY'S TREATMENT                                                                          DATE: 02/20/2024 Therex:    HEP instruction/performance c cues for techniques, handout provided.  Trial set performed of each for comprehension and symptom assessment.  See below for exercise list Nustep lvl 6 UE/LE for knee ROM 8 mins  Vaso 10 mins Rt knee medium compression in elevation 34 deg.   PATIENT EDUCATION:  02/20/2024 Education details: HEP, POC Person educated: Patient Education method: Programmer, multimedia, Demonstration, Verbal cues, and Handouts Education comprehension: verbalized understanding, returned demonstration, and verbal cues required  HOME EXERCISE PROGRAM: Access Code: [email or web address] URL: [email or web address] Date: 02/20/2024 Prepared by: Chyrel Masson  Exercises - Supine Heel Slide  - 2-3 x daily - 7 x weekly - 1 sets - 10 reps - 5 hold - Supine Quadricep Sets  - 2-3 x daily - 7 x weekly - 1 sets - 10 reps - 5 hold - Seated Long Arc Quad  - 2-3 x daily - 7 x weekly - 1 sets - 5-10 reps - 2 hold - Supine Knee Extension Mobilization with Weight  - 2-3 x daily - 7 x weekly - 1 sets - 1 reps - to tolerance up to 5 mins hold - Seated Knee Extension Stretch with Chair  - 2-3 x daily - 7 x weekly - 1 sets - 1 reps - 3-5 mins hold - Seated Quad Set  - 2-3 x daily - 7 x weekly - 1 sets - 10 reps - 5 hold -  Seated Calf Stretch with Strap  - 2-3 x daily - 7 x weekly - 1 sets - 3-5 reps - 30 hold  ASSESSMENT:  CLINICAL IMPRESSION: Continued to focus on knee extension ROM as his flexion ROM is doing great up to this point. I did add in leg press today for strength work and he had good tolerance with this.   OBJECTIVE IMPAIRMENTS: Abnormal gait, decreased activity tolerance, decreased balance, decreased coordination, decreased endurance, decreased mobility, difficulty walking, decreased ROM, decreased strength, hypomobility, increased edema, increased fascial restrictions, impaired perceived functional ability, increased muscle spasms, impaired flexibility, improper body mechanics, and pain.   ACTIVITY LIMITATIONS: lifting, bending, sitting, standing, squatting,  sleeping, stairs, transfers, bed mobility, and locomotion level  PARTICIPATION LIMITATIONS: meal prep, cleaning, laundry, interpersonal relationship, driving, shopping, community activity, and occupation  PERSONAL FACTORS:   PMH includes anemia, asthma, DM, HTN and Lt TKA 2024.  are also affecting patient's functional outcome.   REHAB POTENTIAL: Good  CLINICAL DECISION MAKING: Evolving/moderate complexity  EVALUATION COMPLEXITY: Moderate   GOALS: Goals reviewed with patient? Yes  SHORT TERM GOALS: (target date for Short term goals are 3 weeks 03/12/2024)   1.  Patient will demonstrate independent use of home exercise program to maintain progress from in clinic treatments.  Goal status: New  LONG TERM GOALS: (target dates for all long term goals are 10 weeks  04/30/2024 )   1. Patient will demonstrate/report pain at worst less than or equal to 2/10 to facilitate minimal limitation in daily activity secondary to pain symptoms.  Goal status: New   2. Patient will demonstrate independent use of home exercise program to facilitate ability to maintain/progress functional gains from skilled physical therapy services.  Goal status:  New   3. Patient will demonstrate Patient specific functional scale avg > or = 8/10 to indicate reduced disability due to condition.   Goal status: New   4.  Patient will demonstrate Rt LE MMT 5/5 throughout to faciltiate usual transfers, stairs, squatting at [facility] for daily life.   Goal status: New   5.  Patient will demonstrate Rt knee AROM 0-110 deg to facilitate transfers and other daily mobility at PLOF.  Goal status: New   6.  Patient will demonstrate independent ambulation > 500 ft s deviation for community integration. Goal status: New   7.  Patient will demonstrate ascending/descending stairs reciprocally s UE assist for community integration.   Goal Status: New   PLAN:  PT FREQUENCY: 1-2x/week  PT DURATION: 10 weeks  PLANNED INTERVENTIONS: Can include 40981- PT Re-evaluation, 97110-Therapeutic exercises, 97530- Therapeutic activity, 97112- Neuromuscular re-education, 97535- Self Care, 97140- Manual therapy, [phone]- Gait training, [phone]- Orthotic Fit/training, [phone]- Canalith repositioning, [id number]- Aquatic Therapy, [phone]- Electrical stimulation (unattended), [id number]- Electrical stimulation (manual), [id number] Physical performance testing, 97016- Vasopneumatic device, [id number]- Ultrasound, [id number]- Traction (mechanical), [id number]- Ionotophoresis 4mg /ml Dexamethasone, Patient/Family education, Balance training, Stair training, Taping, Dry Needling, Joint mobilization, Joint manipulation, Spinal manipulation, Spinal mobilization, Scar mobilization, Vestibular training, Visual/preceptual remediation/compensation, DME instructions, Cryotherapy, and Moist heat.  All performed as medically necessary.  All included unless contraindicated  PLAN FOR NEXT SESSION:  Extension gains, early strengthening as tolerated. Vaso if desired.    Jamee Mazzoni, PT, DPT 02/27/24 12:22 PM

## 2024-02-28 ENCOUNTER — Telehealth: Payer: Self-pay | Admitting: *Deleted

## 2024-02-28 NOTE — Telephone Encounter (Signed)
 Thank you :)

## 2024-02-28 NOTE — Telephone Encounter (Signed)
 Looks like patient was only to take Eliquis 4 weeks post-op. Operation was done on 01/29/24, so he should be d/c'ing eliquis around 02/29/24.  See excerpt from orthopedic surgery note 02/13/24 by Dorthy Gavia, PA-C:  "He will continue with his Eliquis until he has finished the bottle which should be about 4 weeks postop. He will then begin taking a baby aspirin twice daily for 2 weeks."

## 2024-02-29 ENCOUNTER — Ambulatory Visit (INDEPENDENT_AMBULATORY_CARE_PROVIDER_SITE_OTHER): Admitting: Rehabilitative and Restorative Service Providers"

## 2024-02-29 ENCOUNTER — Encounter: Payer: Self-pay | Admitting: Rehabilitative and Restorative Service Providers"

## 2024-02-29 DIAGNOSIS — M25561 Pain in right knee: Secondary | ICD-10-CM

## 2024-02-29 DIAGNOSIS — M25661 Stiffness of right knee, not elsewhere classified: Secondary | ICD-10-CM

## 2024-02-29 DIAGNOSIS — M6281 Muscle weakness (generalized): Secondary | ICD-10-CM | POA: Diagnosis not present

## 2024-02-29 DIAGNOSIS — R262 Difficulty in walking, not elsewhere classified: Secondary | ICD-10-CM

## 2024-02-29 DIAGNOSIS — G8929 Other chronic pain: Secondary | ICD-10-CM

## 2024-02-29 DIAGNOSIS — R600 Localized edema: Secondary | ICD-10-CM

## 2024-02-29 NOTE — Telephone Encounter (Signed)
 TY

## 2024-02-29 NOTE — Therapy (Signed)
 OUTPATIENT PHYSICAL THERAPY TREATMENT   Patient Name: Craig House MRN: [id number] DOB:[birth year], 63 y.o., male Today's Date: 02/29/2024  END OF SESSION:  PT End of Session - 02/29/24 0836     Visit Number 4    Number of Visits 20    Date for PT Re-Evaluation 04/30/24    Authorization Type [employer]    Progress Note Due on Visit 10    PT Start Time 0839    PT Stop Time 0929    PT Time Calculation (min) 50 min    Activity Tolerance Patient tolerated treatment well    Behavior During Therapy [facility] for tasks assessed/performed                Past Medical History:  Diagnosis Date   Allergy    spring   Anemia    Asthma    Diabetes mellitus without complication (HCC)    type II   Hyperplastic colon polyp    Hypertension    Obesity    Past Surgical History:  Procedure Laterality Date   KNEE ARTHROSCOPY Bilateral    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   Procedure: LEFT TOTAL KNEE ARTHROPLASTY;  Surgeon: Tarry Kos, MD;  Location: [facility] OR;  Service: Orthopedics;  Laterality: Left;   TOTAL KNEE ARTHROPLASTY Right 01/29/2024   Procedure: RIGHT TOTAL KNEE ARTHROPLASTY;  Surgeon: Tarry Kos, MD;  Location: [facility] OR;  Service: Orthopedics;  Laterality: Right;   Patient Active Problem List   Diagnosis Date Noted   Status post total right knee replacement 01/29/2024   Primary osteoarthritis of right knee 12/26/2023   Status post total left knee replacement 01/09/2023   Bilateral primary osteoarthritis of knee 06/19/2018   Mild intermittent asthma, uncomplicated 01/18/2016   HTN (hypertension) 12/31/2011    PCP: Esperanza Richters, PA - C  REFERRING PROVIDER: Cristie Hem, PA-C  REFERRING DIAG: [phone] (ICD-10-CM) - Status post total right knee replacement  THERAPY DIAG:  Chronic pain of right knee  Stiffness of right knee, not elsewhere classified  Muscle weakness (generalized)  Difficulty in walking, not elsewhere classified  Localized  edema  Rationale for Evaluation and Treatment: Rehabilitation  ONSET DATE: 01/29/2024 surgery  SUBJECTIVE:   SUBJECTIVE STATEMENT: Pt indicated stiff upon arrival today.  Reported having up to 7/10 and took medicine this morning but didn't seem like it helped much yet.    PERTINENT HISTORY:  PMH includes anemia, asthma, DM, HTN and Lt TKA 2024.  PAIN:  NPRS scale: up to 7/10 Pain location: Rt knee  Pain description: sharp pain, ache/tightness.  Aggravating factors: static positioning, end ranges, WB pressure.  Relieving factors: medicine, ice  PRECAUTIONS: None  WEIGHT BEARING RESTRICTIONS: No  FALLS:  Has patient fallen in last 6 months? No  LIVING ENVIRONMENT: Lives with:  girlfriend Lives in: House/apartment Stairs: flight to bedroom, stairs to enter house with handrail Has following equipment at home: [facility], FWW  OCCUPATION: [employer].  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  Waist to waist lifting.   PLOF: Independent, walking for exercise, previous bike riding.  [employer]  PATIENT GOALS: Reduce pain, get back to work and activity.   OBJECTIVE:   PATIENT SURVEYS:  Patient-Specific Activity Scoring Scheme  "0" represents "unable to perform." "10" represents "able to perform at prior level. 0 1 2 3 4 5 6 7 8 9  10 (Date and Score)   Activity Eval  02/20/2024    1. Stairs  2  2. standing 4    3. showering 4   4.  Prolonged static positioning (sleeping, sitting) 2   5.    Score 3 avg    Total score = sum of the activity scores/number of activities Minimum detectable change (90%CI) for average score = 2 points Minimum detectable change (90%CI) for single activity score = 3 points  COGNITION: 02/20/2024 Overall cognitive status: WFL    SENSATION: 02/20/2024 Not tested  EDEMA:  02/20/2024 Localized edema noted in Rt knee/lower leg   MUSCLE LENGTH: 02/20/2024 No specific testing  POSTURE:  02/20/2024 Nothing specific noted  impacting Rt knee other than weight shift towards Lt in standing.   PALPATION: 02/20/2024 Mild tenderness anterior knee joint, incision area.   LOWER EXTREMITY ROM:   ROM Right Eval 02/20/2024 Left Eval 02/20/2024 Right 02/29/2024  Hip flexion     Hip extension     Hip abduction     Hip adduction     Hip internal rotation     Hip external rotation     Knee flexion 109 AROM in supine heel slide   106 in supine AROM heel slide  Knee extension -31 AROM in seated LAQ  Supine heel prop: -11   -6 in supine quad set  Ankle dorsiflexion     Ankle plantarflexion     Ankle inversion     Ankle eversion      (Blank rows = not tested)  LOWER EXTREMITY MMT:  MMT Right Eval 02/20/2024 Left Eval 02/20/2024  Hip flexion 5/5 5/5  Hip extension    Hip abduction    Hip adduction    Hip internal rotation    Hip external rotation    Knee flexion 4/5 5/5  Knee extension 3+/5 5/5  Ankle dorsiflexion 5/5 5/5  Ankle plantarflexion    Ankle inversion    Ankle eversion     (Blank rows = not tested)  LOWER EXTREMITY SPECIAL TESTS:  02/20/2024 No specific testing today  FUNCTIONAL TESTS:  02/20/2024 18 inch chair transfer: able to perform without UE assist after several tries with deviation to Lt LE Lt SLS: 10 seconds Rt SLS: unable unassisted.   GAIT: 02/20/2024 Distance walked: household distances  Assistive device utilized: SPC use in Lt UE Level of assistance: Modified independence Comments: reduced stance on Rt leg, maintained knee flexion                                                                                                                                                                        TODAY'S TREATMENT  DATE: 02/29/2024 Therex: Recumbent bike lvl 1.0 seat 7 for ROM - 10 mins  Incline gastroc stretch 30 sec x 5 bilateral  Seated LAQ 3 lbs 2 x 15 with end range pauses each direction Supine heel  slide AROM 5 sec hold x 10 (paired with quad set alternating)  Supine heel prop TKE stretch first 3 min of vaso  TherActivity: (to improve transfers, progressive mobility, stairs) Leg press double leg 62 lbs x15   , single leg Rt leg 2 x 15 25 lbs  Sit to stand to sit from chair 18 inch without UE assist x 10 with slow lowering focus  Neuro Re-ed (balance control, muscle activation) Tandem stance 1 min x 2 bilateral with occasional HHA on bar Supine quad set 5 sec hold x 10 Rt leg   Vaso 10 mins Rt knee medium compression in elevation 34 deg.   TODAY'S TREATMENT                                                                          DATE: 02/27/2024 Precor bike seat #8 X 10 min for knee ROM Leg press DL 16# 1W96, then Rt leg only 25# 2X10 Seated knee flexion AAROM with LAQ in between 5 sec hold X 10 Seated LAQ 3# 3X10 Seated hamstring stretch 30 sec X 3 Supine heel prop with quad sets 5 sec X 10 Supine heel prop TKE stretch first 3 min of vaso  Manual: Rt knee extension stretching with overpressure and extension mobs grade 3 in supine  Vaso 10 mins Rt knee medium compression in elevation 34 deg.   TODAY'S TREATMENT                                                                          DATE: 02/22/2024 Therex: Nustep lvl 6 UE/LE for knee ROM 11 min for knee ROM Seated knee flexion AAROM with LAQ in between 5 sec hold X 10 Seated LAQ 2# 3X10 Seated hamstring stretch 30 sec X 3 Supine heel prop with quad sets 5 sec X 10 Supine heel prop TKE stretch first 3 min of vaso  Manual: Rt knee extension stretching with overpressure and extension mobs grade 3 in supine  Vaso 10 mins Rt knee medium compression in elevation 34 deg.     PATIENT EDUCATION:  02/20/2024 Education details: HEP, POC Person educated: Patient Education method: Programmer, multimedia, Demonstration, Verbal cues, and Handouts Education comprehension: verbalized understanding, returned demonstration, and verbal cues  required  HOME EXERCISE PROGRAM: Access Code: [email or web address] URL: [email or web address] Date: 02/20/2024 Prepared by: Chyrel Masson  Exercises - Supine Heel Slide  - 2-3 x daily - 7 x weekly - 1 sets - 10 reps - 5 hold - Supine Quadricep Sets  - 2-3 x daily - 7 x weekly - 1 sets - 10 reps - 5 hold - Seated Long Arc Quad  - 2-3 x daily - 7 x weekly - 1 sets - 5-10 reps -  2 hold - Supine Knee Extension Mobilization with Weight  - 2-3 x daily - 7 x weekly - 1 sets - 1 reps - to tolerance up to 5 mins hold - Seated Knee Extension Stretch with Chair  - 2-3 x daily - 7 x weekly - 1 sets - 1 reps - 3-5 mins hold - Seated Quad Set  - 2-3 x daily - 7 x weekly - 1 sets - 10 reps - 5 hold - Seated Calf Stretch with Strap  - 2-3 x daily - 7 x weekly - 1 sets - 3-5 reps - 30 hold  ASSESSMENT:  CLINICAL IMPRESSION: Check of extension in supine showed improvement compared to eval.  Continued work on improving quality of range and general strength building indicated.  Continued skilled PT services recommended.   OBJECTIVE IMPAIRMENTS: Abnormal gait, decreased activity tolerance, decreased balance, decreased coordination, decreased endurance, decreased mobility, difficulty walking, decreased ROM, decreased strength, hypomobility, increased edema, increased fascial restrictions, impaired perceived functional ability, increased muscle spasms, impaired flexibility, improper body mechanics, and pain.   ACTIVITY LIMITATIONS: lifting, bending, sitting, standing, squatting, sleeping, stairs, transfers, bed mobility, and locomotion level  PARTICIPATION LIMITATIONS: meal prep, cleaning, laundry, interpersonal relationship, driving, shopping, community activity, and occupation  PERSONAL FACTORS:   PMH includes anemia, asthma, DM, HTN and Lt TKA 2024.  are also affecting patient's functional outcome.   REHAB POTENTIAL: Good  CLINICAL DECISION MAKING: Evolving/moderate complexity  EVALUATION  COMPLEXITY: Moderate   GOALS: Goals reviewed with patient? Yes  SHORT TERM GOALS: (target date for Short term goals are 3 weeks 03/12/2024)   1.  Patient will demonstrate independent use of home exercise program to maintain progress from in clinic treatments.  Goal status: Met  LONG TERM GOALS: (target dates for all long term goals are 10 weeks  04/30/2024 )   1. Patient will demonstrate/report pain at worst less than or equal to 2/10 to facilitate minimal limitation in daily activity secondary to pain symptoms.  Goal status: New   2. Patient will demonstrate independent use of home exercise program to facilitate ability to maintain/progress functional gains from skilled physical therapy services.  Goal status: New   3. Patient will demonstrate Patient specific functional scale avg > or = 8/10 to indicate reduced disability due to condition.   Goal status: New   4.  Patient will demonstrate Rt LE MMT 5/5 throughout to faciltiate usual transfers, stairs, squatting at [facility] for daily life.   Goal status: New   5.  Patient will demonstrate Rt knee AROM 0-110 deg to facilitate transfers and other daily mobility at PLOF.  Goal status: New   6.  Patient will demonstrate independent ambulation > 500 ft s deviation for community integration. Goal status: New   7.  Patient will demonstrate ascending/descending stairs reciprocally s UE assist for community integration.   Goal Status: New   PLAN:  PT FREQUENCY: 1-2x/week  PT DURATION: 10 weeks  PLANNED INTERVENTIONS: Can include 44010- PT Re-evaluation, 97110-Therapeutic exercises, 97530- Therapeutic activity, 97112- Neuromuscular re-education, 97535- Self Care, 97140- Manual therapy, [phone]- Gait training, [phone]- Orthotic Fit/training, [phone]- Canalith repositioning, [id number]- Aquatic Therapy, [phone]- Electrical stimulation (unattended), [phone]- Electrical stimulation (manual), [id number] Physical performance testing, 97016- Vasopneumatic  device, [id number]- Ultrasound, [id number]- Traction (mechanical), [id number]- Ionotophoresis 4mg /ml Dexamethasone, Patient/Family education, Balance training, Stair training, Taping, Dry Needling, Joint mobilization, Joint manipulation, Spinal manipulation, Spinal mobilization, Scar mobilization, Vestibular training, Visual/preceptual remediation/compensation, DME instructions, Cryotherapy, and Moist heat.  All performed as medically necessary.  All included unless contraindicated  PLAN FOR NEXT SESSION:  Continued strengthening, balance. And mobility gains.   Bonna Bustard, PT, DPT, OCS, ATC 02/29/24  9:25 AM

## 2024-03-05 ENCOUNTER — Encounter

## 2024-03-05 ENCOUNTER — Telehealth: Payer: Self-pay | Admitting: *Deleted

## 2024-03-05 ENCOUNTER — Ambulatory Visit (INDEPENDENT_AMBULATORY_CARE_PROVIDER_SITE_OTHER): Admitting: Physical Therapy

## 2024-03-05 ENCOUNTER — Encounter: Payer: Self-pay | Admitting: Physical Therapy

## 2024-03-05 DIAGNOSIS — R262 Difficulty in walking, not elsewhere classified: Secondary | ICD-10-CM | POA: Diagnosis not present

## 2024-03-05 DIAGNOSIS — M25661 Stiffness of right knee, not elsewhere classified: Secondary | ICD-10-CM | POA: Diagnosis not present

## 2024-03-05 DIAGNOSIS — M6281 Muscle weakness (generalized): Secondary | ICD-10-CM | POA: Diagnosis not present

## 2024-03-05 DIAGNOSIS — M25561 Pain in right knee: Secondary | ICD-10-CM

## 2024-03-05 DIAGNOSIS — G8929 Other chronic pain: Secondary | ICD-10-CM

## 2024-03-05 DIAGNOSIS — R600 Localized edema: Secondary | ICD-10-CM

## 2024-03-05 NOTE — Therapy (Signed)
 OUTPATIENT PHYSICAL THERAPY TREATMENT   Patient Name: Craig House MRN: [id number] DOB:09/29/1961, 63 y.o., male Today's Date: 03/05/2024  END OF SESSION:  PT End of Session - 03/05/24 0757     Visit Number 5    Number of Visits 20    Date for PT Re-Evaluation 04/30/24    Authorization Type [employer]    Progress Note Due on Visit 10    PT Start Time 0757    PT Stop Time 0855    PT Time Calculation (min) 58 min    Activity Tolerance Patient tolerated treatment well    Behavior During Therapy [facility] for tasks assessed/performed                 Past Medical History:  Diagnosis Date   Allergy    spring   Anemia    Asthma    Diabetes mellitus without complication (HCC)    type II   Hyperplastic colon polyp    Hypertension    Obesity    Past Surgical History:  Procedure Laterality Date   KNEE ARTHROSCOPY Bilateral    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   Procedure: LEFT TOTAL KNEE ARTHROPLASTY;  Surgeon: Wes Hamman, MD;  Location: [facility] OR;  Service: Orthopedics;  Laterality: Left;   TOTAL KNEE ARTHROPLASTY Right 01/29/2024   Procedure: RIGHT TOTAL KNEE ARTHROPLASTY;  Surgeon: Wes Hamman, MD;  Location: [facility] OR;  Service: Orthopedics;  Laterality: Right;   Patient Active Problem List   Diagnosis Date Noted   Status post total right knee replacement 01/29/2024   Primary osteoarthritis of right knee 12/26/2023   Status post total left knee replacement 01/09/2023   Bilateral primary osteoarthritis of knee 06/19/2018   Mild intermittent asthma, uncomplicated 01/18/2016   HTN (hypertension) 12/31/2011    PCP: Sylvia Everts, PA - C  REFERRING PROVIDER: Sandie Cross, PA-C  REFERRING DIAG: [phone] (ICD-10-CM) - Status post total right knee replacement  THERAPY DIAG:  Chronic pain of right knee  Stiffness of right knee, not elsewhere classified  Muscle weakness (generalized)  Difficulty in walking, not elsewhere classified  Localized  edema  Rationale for Evaluation and Treatment: Rehabilitation  ONSET DATE: 01/29/2024 surgery  SUBJECTIVE:   SUBJECTIVE STATEMENT: He thinks that the right knee is healing well.  He has not been elevating with LEs over the heart.   PERTINENT HISTORY: PMH includes anemia, asthma, DM, HTN and Lt TKA 2024.  PAIN:  NPRS scale: today  at rest 4/10 & moving 7/10,  over the last week lowest 4/10 highest  7/10 Pain location: Rt knee  Pain description: sharp pain, ache/tightness.  Aggravating factors: static positioning, end ranges, WB pressure.  Relieving factors: medicine, ice  PRECAUTIONS: None  WEIGHT BEARING RESTRICTIONS: No  FALLS:  Has patient fallen in last 6 months? 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  Waist to waist lifting.   PLOF: Independent, walking for exercise, previous bike riding.  [employer]  PATIENT GOALS: Reduce pain, get back to work and activity.   OBJECTIVE:   PATIENT SURVEYS:  Patient-Specific Activity Scoring Scheme  "0" represents "unable to perform." "10" represents "able to perform at prior level. 0 1 2 3 4 5 6 7 8 9  10 (Date and Score)   Activity Eval  02/20/2024  1. Stairs  2     2. standing 4    3. showering 4   4.  Prolonged static positioning (sleeping, sitting) 2   5.    Score 3 avg    Total score = sum of the activity scores/number of activities Minimum detectable change (90%CI) for average score = 2 points Minimum detectable change (90%CI) for single activity score = 3 points  COGNITION: 02/20/2024 Overall cognitive status: WFL    SENSATION: 02/20/2024 Not tested  EDEMA:  02/20/2024 Localized edema noted in Rt knee/lower leg   MUSCLE LENGTH: 02/20/2024 No specific testing  POSTURE:   02/20/2024 Nothing specific noted impacting Rt knee other than weight shift towards Lt in standing.   PALPATION: 02/20/2024 Mild tenderness anterior knee joint, incision area.   LOWER EXTREMITY ROM:   ROM Right Eval 02/20/2024 Right 02/29/24  Hip flexion    Hip extension    Hip abduction    Hip adduction    Hip internal rotation    Hip external rotation    Knee flexion 109 AROM in supine heel slide  106 in supine AROM heel slide  Knee extension -31 AROM in seated LAQ  Supine heel prop: -11  -6 in supine quad set  Ankle dorsiflexion    Ankle plantarflexion    Ankle inversion    Ankle eversion     (Blank rows = not tested)  LOWER EXTREMITY MMT:  MMT Right Eval 02/20/2024 Left Eval 02/20/2024  Hip flexion 5/5 5/5  Hip extension    Hip abduction    Hip adduction    Hip internal rotation    Hip external rotation    Knee flexion 4/5 5/5  Knee extension 3+/5 5/5  Ankle dorsiflexion 5/5 5/5  Ankle plantarflexion    Ankle inversion    Ankle eversion     (Blank rows = not tested)  LOWER EXTREMITY SPECIAL TESTS:  02/20/2024 No specific testing today  FUNCTIONAL TESTS:  02/20/2024 18 inch chair transfer: able to perform without UE assist after several tries with deviation to Lt LE Lt SLS: 10 seconds Rt SLS: unable unassisted.   GAIT: 02/20/2024 Distance walked: household distances  Assistive device utilized: SPC use in Lt UE Level of assistance: Modified independence Comments: reduced stance on Rt leg, maintained knee flexion                                                                                                                                                                        TODAY'S TREATMENT  DATE: 03/05/2024 Therapeutic Exercise: Recumbent bike lvl 1.0 seat 7 for ROM - 10 mins  Hamstring stretch SLR strap 30 sec hold 3 reps during rests on leg press Gastroc stretch on step heel  depression  30 sec hold 3 reps Quad stretch RLE over edge strap knee flexion  30 sec hold 3 reps PT added above 3 stretches and tandem stance to HEP with HO, demo & verbal cues.  Pt verbalized understanding after performing in PT.   Therapeutic Activities: Leg Press BLEs 75# 5 sec hold ext & flex 10 reps 2 sets;   Weight shift onto RLE  upon arising 3-5 sec 5 reps. Pt has less antalgic gait with this activity.  PT demo & verbal cues on Using 24" bar stool for standing ADLs to progress standing tolerance. Sit to/from stand demo. Pt verbalized understanding.  Tandem stance 30 sec RLE in front & in back; 1st rep floor eyes open, 2nd rep floor eyes closed, 3rd rep foam eyes open  Self-Care: PT demo & verbal cues on positioning in bed including "tenting" sheets off feet.  Pt verbalized understanding.   Vaso 10 mins Rt knee medium compression in elevation 34 deg.    TREATMENT                                                                          DATE: 02/29/2024 Therex: Recumbent bike lvl 1.0 seat 7 for ROM - 10 mins  Incline gastroc stretch 30 sec x 5 bilateral  Seated LAQ 3 lbs 2 x 15 with end range pauses each direction Supine heel slide AROM 5 sec hold x 10 (paired with quad set alternating)  Supine heel prop TKE stretch first 3 min of vaso  TherActivity: (to improve transfers, progressive mobility, stairs) Leg press double leg 62 lbs x15   , single leg Rt leg 2 x 15 25 lbs  Sit to stand to sit from chair 18 inch without UE assist x 10 with slow lowering focus  Neuro Re-ed (balance control, muscle activation) Tandem stance 1 min x 2 bilateral with occasional HHA on bar Supine quad set 5 sec hold x 10 Rt leg   Vaso 10 mins Rt knee medium compression in elevation 34 deg.    TREATMENT                                                                          DATE: 02/27/2024 Precor bike seat #8 X 10 min for knee ROM Leg press DL 16# 1W96, then Rt leg only 25# 2X10 Seated knee  flexion AAROM with LAQ in between 5 sec hold X 10 Seated LAQ 3# 3X10 Seated hamstring stretch 30 sec X 3 Supine heel prop with quad sets 5 sec X 10 Supine heel prop TKE stretch first 3 min of vaso  Manual: Rt knee extension stretching with overpressure and extension mobs grade 3 in supine  Vaso 10 mins Rt knee medium compression in  elevation 34 deg.     PATIENT EDUCATION:  02/20/2024 Education details: HEP, POC Person educated: Patient Education method: Programmer, multimedia, Demonstration, Verbal cues, and Handouts Education comprehension: verbalized understanding, returned demonstration, and verbal cues required  HOME EXERCISE PROGRAM: Access Code: [email or web address] URL: [email or web address] Date: 03/05/2024 Prepared by: Lorie Rook  Exercises - Supine Heel Slide  - 2-3 x daily - 7 x weekly - 1 sets - 10 reps - 5 hold - Supine Quadricep Sets  - 2-3 x daily - 7 x weekly - 1 sets - 10 reps - 5 hold - Seated Long Arc Quad  - 2-3 x daily - 7 x weekly - 1 sets - 5-10 reps - 2 hold - Supine Knee Extension Mobilization with Weight  - 2-3 x daily - 7 x weekly - 1 sets - 1 reps - to tolerance up to 5 mins hold - Seated Knee Extension Stretch with Chair  - 2-3 x daily - 7 x weekly - 1 sets - 1 reps - 3-5 mins hold - Seated Quad Set  - 2-3 x daily - 7 x weekly - 1 sets - 10 reps - 5 hold - Seated Calf Stretch with Strap  - 2-3 x daily - 7 x weekly - 1 sets - 3-5 reps - 30 hold - Hooklying Hamstring Stretch with Strap  - 1-3 x daily - 7 x weekly - 1 sets - 3 reps - 30 seconds hold - Gastroc Stretch on Step  - 1-3 x daily - 7 x weekly - 1 sets - 3 reps - 30 seconds hold - Supine Quadriceps Stretch with Strap on Table  - 1-3 x daily - 7 x weekly - 1 sets - 3 reps - 30 seconds hold - Tandem Stance  - 1 x daily - 7 x weekly - 3 sets - 2 reps - 30 seconds hold  ASSESSMENT:  CLINICAL IMPRESSION: Patient appears to understand updated HEP that includes stretches & tandem stance.  He is  tolerating increased activities.  Continued skilled PT services recommended.   OBJECTIVE IMPAIRMENTS: Abnormal gait, decreased activity tolerance, decreased balance, decreased coordination, decreased endurance, decreased mobility, difficulty walking, decreased ROM, decreased strength, hypomobility, increased edema, increased fascial restrictions, impaired perceived functional ability, increased muscle spasms, impaired flexibility, improper body mechanics, and pain.   ACTIVITY LIMITATIONS: lifting, bending, sitting, standing, squatting, sleeping, stairs, transfers, bed mobility, and locomotion level  PARTICIPATION LIMITATIONS: meal prep, cleaning, laundry, interpersonal relationship, driving, shopping, community activity, and occupation  PERSONAL FACTORS:   PMH includes anemia, asthma, DM, HTN and Lt TKA 2024.  are also affecting patient's functional outcome.   REHAB POTENTIAL: Good  CLINICAL DECISION MAKING: Evolving/moderate complexity  EVALUATION COMPLEXITY: Moderate   GOALS: Goals reviewed with patient? Yes  SHORT TERM GOALS: (target date for Short term goals are 3 weeks 03/12/2024)   1.  Patient will demonstrate independent use of home exercise program to maintain progress from in clinic treatments.  Goal status: Met  LONG TERM GOALS: (target dates for all long term goals are 10 weeks  04/30/2024 )   1. Patient will demonstrate/report pain at worst less than or equal to 2/10 to facilitate minimal limitation in daily activity secondary to pain symptoms.  Goal status: Ongoing   03/05/2024   2. Patient will demonstrate independent use of home exercise program to facilitate ability to maintain/progress functional gains from skilled physical therapy services.  Goal status: Ongoing   03/05/2024   3. Patient will demonstrate Patient specific functional scale avg >  or = 8/10 to indicate reduced disability due to condition.   Goal status:  Ongoing   03/05/2024   4.  Patient will  demonstrate Rt LE MMT 5/5 throughout to faciltiate usual transfers, stairs, squatting at [facility] for daily life.   Goal status: Ongoing   03/05/2024   5.  Patient will demonstrate Rt knee AROM 0-110 deg to facilitate transfers and other daily mobility at PLOF.  Goal status: New   6.  Patient will demonstrate independent ambulation > 500 ft s deviation for community integration. Goal status: Ongoing   03/05/2024   7.  Patient will demonstrate ascending/descending stairs reciprocally s UE assist for community integration.   Goal Status: Ongoing   03/05/2024   PLAN:  PT FREQUENCY: 1-2x/week  PT DURATION: 10 weeks  PLANNED INTERVENTIONS: Can include 16109- PT Re-evaluation, 97110-Therapeutic exercises, 97530- Therapeutic activity, 97112- Neuromuscular re-education, 97535- Self Care, 97140- Manual therapy, [phone]- Gait training, [phone]- Orthotic Fit/training, [phone]- Canalith repositioning, [id number]- Aquatic Therapy, [phone]- Electrical stimulation (unattended), [phone]- Electrical stimulation (manual), [id number] Physical performance testing, 97016- Vasopneumatic device, [id number]- Ultrasound, [id number]- Traction (mechanical), [id number]- Ionotophoresis 4mg /ml Dexamethasone , Patient/Family education, Balance training, Stair training, Taping, Dry Needling, Joint mobilization, Joint manipulation, Spinal manipulation, Spinal mobilization, Scar mobilization, Vestibular training, Visual/preceptual remediation/compensation, DME instructions, Cryotherapy, and Moist heat.  All performed as medically necessary.  All included unless contraindicated  PLAN FOR NEXT SESSION: send progress note prior to MD appt, measure ROM,    Continued strengthening, balance. And mobility gains.    Lorie Rook, PT, DPT 03/05/2024, 1:01 PM

## 2024-03-05 NOTE — Telephone Encounter (Signed)
 Attempt to reach pt for pre-visit. LM with call back #.  Will attempt to reach again in 5 min due to no other # listed in profile  Second attempt to reach pt for pre-vist unsuccessful. LM with facility # for pt to call back. Instructed pt to call # given by end of the day and reschedule the pre-visit  with RN or the scheduled procedure will be canceled.

## 2024-03-06 ENCOUNTER — Ambulatory Visit (AMBULATORY_SURGERY_CENTER)

## 2024-03-06 ENCOUNTER — Ambulatory Visit (INDEPENDENT_AMBULATORY_CARE_PROVIDER_SITE_OTHER): Admitting: Physical Therapy

## 2024-03-06 ENCOUNTER — Encounter: Payer: Self-pay | Admitting: Physical Therapy

## 2024-03-06 VITALS — Ht 67.0 in | Wt 183.0 lb

## 2024-03-06 DIAGNOSIS — Z860100 Personal history of colon polyps, unspecified: Secondary | ICD-10-CM

## 2024-03-06 DIAGNOSIS — M25661 Stiffness of right knee, not elsewhere classified: Secondary | ICD-10-CM | POA: Diagnosis not present

## 2024-03-06 DIAGNOSIS — M6281 Muscle weakness (generalized): Secondary | ICD-10-CM

## 2024-03-06 DIAGNOSIS — M25561 Pain in right knee: Secondary | ICD-10-CM | POA: Diagnosis not present

## 2024-03-06 DIAGNOSIS — R600 Localized edema: Secondary | ICD-10-CM

## 2024-03-06 DIAGNOSIS — R262 Difficulty in walking, not elsewhere classified: Secondary | ICD-10-CM | POA: Diagnosis not present

## 2024-03-06 DIAGNOSIS — G8929 Other chronic pain: Secondary | ICD-10-CM

## 2024-03-06 MED ORDER — NA SULFATE-K SULFATE-MG SULF 17.5-3.13-1.6 GM/177ML PO SOLN
1.0000 | Freq: Once | ORAL | 0 refills | Status: AC
Start: 2024-03-06 — End: 2024-03-06

## 2024-03-06 NOTE — Progress Notes (Signed)
No egg or soy allergy known to patient  No issues known to pt with past sedation with any surgeries or procedures Patient denies ever being told they had issues or difficulty with intubation  No FH of Malignant Hyperthermia Pt is not on diet pills Pt is not on  home 02  Pt is not on blood thinners  Pt denies issues with constipation  No A fib or A flutter Have any cardiac testing pending--no Pt can ambulate with cane Pt denies use of chewing tobacco Discussed diabetic I weight loss medication holds Discussed NSAID holds Checked BMI Pt instructed to use Single[employer] or GoodRx for a price reduction on prep  Patient's chart reviewed by Cathlyn Parsons CNRA prior to previsit and patient appropriate for the [facility].  Pre visit completed and red dot placed by patient's name on their procedure day (on provider's schedule).

## 2024-03-06 NOTE — Therapy (Signed)
 OUTPATIENT PHYSICAL THERAPY TREATMENT   Patient Name: Craig House MRN: [id number] DOB:[birth year], 63 y.o., male Today's Date: 03/06/2024  END OF SESSION:  PT End of Session - 03/06/24 0846     Visit Number 6    Number of Visits 20    Date for PT Re-Evaluation 04/30/24    Authorization Type [employer]    Progress Note Due on Visit 10    PT Start Time [phone]    PT Stop Time 0940    PT Time Calculation (min) 54 min    Activity Tolerance Patient tolerated treatment well    Behavior During Therapy [facility] for tasks assessed/performed                  Past Medical History:  Diagnosis Date   Allergy    spring   Anemia    Asthma    Diabetes mellitus without complication (HCC)    type II   Hyperplastic colon polyp    Hypertension    Obesity    Past Surgical History:  Procedure Laterality Date   KNEE ARTHROSCOPY Bilateral    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   Procedure: LEFT TOTAL KNEE ARTHROPLASTY;  Surgeon: Wes Hamman, MD;  Location: [facility] OR;  Service: Orthopedics;  Laterality: Left;   TOTAL KNEE ARTHROPLASTY Right 01/29/2024   Procedure: RIGHT TOTAL KNEE ARTHROPLASTY;  Surgeon: Wes Hamman, MD;  Location: [facility] OR;  Service: Orthopedics;  Laterality: Right;   Patient Active Problem List   Diagnosis Date Noted   Status post total right knee replacement 01/29/2024   Primary osteoarthritis of right knee 12/26/2023   Status post total left knee replacement 01/09/2023   Bilateral primary osteoarthritis of knee 06/19/2018   Mild intermittent asthma, uncomplicated 01/18/2016   HTN (hypertension) 12/31/2011    PCP: Sylvia Everts, PA - C  REFERRING PROVIDER: Sandie Cross, PA-C  REFERRING DIAG: [phone] (ICD-10-CM) - Status post total right knee replacement  THERAPY DIAG:  Chronic pain of right knee  Stiffness of right knee, not elsewhere classified  Muscle weakness (generalized)  Difficulty in walking, not elsewhere classified  Localized  edema  Rationale for Evaluation and Treatment: Rehabilitation  ONSET DATE: 01/29/2024 surgery  SUBJECTIVE:   SUBJECTIVE STATEMENT: The stretch exercises helped his knee move better.   PERTINENT HISTORY: PMH includes anemia, asthma, DM, HTN and Lt TKA 2024.  PAIN:  NPRS scale: today  at rest 4/10 & moving 7/10,  over the last week lowest 4/10 highest  7/10 Pain location: Rt knee  Pain description: sharp pain, ache/tightness.  Aggravating factors: static positioning, end ranges, WB pressure.  Relieving factors: medicine, ice  PRECAUTIONS: None  WEIGHT BEARING RESTRICTIONS: No  FALLS:  Has patient fallen in last 6 months? No  LIVING ENVIRONMENT: Lives with:  girlfriend Lives in: House/apartment Stairs: flight to bedroom, stairs to enter house with handrail Has following equipment at home: [facility], FWW  OCCUPATION: [employer].  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  Waist to waist lifting.   PLOF: Independent, walking for exercise, previous bike riding.  [employer]  PATIENT GOALS: Reduce pain, get back to work and activity.   OBJECTIVE:   PATIENT SURVEYS:  Patient-Specific Activity Scoring Scheme  "0" represents "unable to perform." "10" represents "able to perform at prior level. 0 1 2 3 4 5 6 7 8 9  10 (Date and Score)   Activity Eval  02/20/2024 03/06/24   1. Stairs  2  2. standing 4    3. showering 4   4.  Prolonged static positioning (sleeping, sitting) 2   5.    Score 3 avg    Total score = sum of the activity scores/number of activities Minimum detectable change (90%CI) for average score = 2 points Minimum detectable change (90%CI) for single activity score = 3 points  COGNITION: 02/20/2024 Overall cognitive status: WFL    SENSATION: 02/20/2024 Not tested  EDEMA:  02/20/2024 Localized edema noted in Rt knee/lower leg   MUSCLE LENGTH: 02/20/2024 No specific testing  POSTURE:  02/20/2024 Nothing specific noted impacting Rt knee  other than weight shift towards Lt in standing.   PALPATION: 02/20/2024 Mild tenderness anterior knee joint, incision area.   LOWER EXTREMITY ROM:   ROM Right Eval 02/20/2024 Right 02/29/24 Right 03/06/24  Hip flexion     Hip extension     Hip abduction     Hip adduction     Hip internal rotation     Hip external rotation     Knee flexion 109 AROM in supine heel slide  106 in supine AROM heel slide Seated P: 113* A: 107*  Knee extension -31 AROM in seated LAQ  Supine heel prop: -11  -6 in supine quad set LAQ -7*  Ankle dorsiflexion     Ankle plantarflexion     Ankle inversion     Ankle eversion      (Blank rows = not tested)  LOWER EXTREMITY MMT:  MMT Right Eval 02/20/2024 Left Eval 02/20/2024 Right 03/06/24  Hip flexion 5/5 5/5   Hip extension     Hip abduction     Hip adduction     Hip internal rotation     Hip external rotation     Knee flexion 4/5 5/5   Knee extension 3+/5 5/5   Ankle dorsiflexion 5/5 5/5   Ankle plantarflexion     Ankle inversion     Ankle eversion      (Blank rows = not tested)  LOWER EXTREMITY SPECIAL TESTS:  02/20/2024 No specific testing today  FUNCTIONAL TESTS:  02/20/2024 18 inch chair transfer: able to perform without UE assist after several tries with deviation to Lt LE Lt SLS: 10 seconds Rt SLS: unable unassisted.   GAIT: 02/20/2024 Distance walked: household distances  Assistive device utilized: SPC use in Lt UE Level of assistance: Modified independence Comments: reduced stance on Rt leg, maintained knee flexion                                                                                                                                                                        TODAY'S TREATMENT  DATE: 03/06/2024 Therapeutic Exercise: Recumbent bike lvl 1.0 seat 7 for ROM - 8 mins  Seated LAQ with end range ankle DF & contralateral knee flexion to facilitate  stronger quad contraction 10 reps 2 sets  Therapeutic Activities: Tandem stance 30 sec RLE in front & in back; 1st rep floor eyes open, 2nd rep floor eyes closed, 3rd rep foam eyes open.  At sink for HEP. Pt verbalized understanding.  PT demo & verbal cues on sit to/from stand technique. Pt performed 10 reps with PT cues to improve form.  Stairs: PT demo & verbal cues on technique. 11 steps step-to pattern using RLE 1st rep 2 rails, 2nd rep single rail (8 steps left rail, 3 steps right rail) and cane.    Manual Therapy: Grade IV mob with traction & tibial int rot for knee flexion range  Vaso 10 mins Rt knee medium compression in elevation ext stretch 34 deg.    TREATMENT                                                                          DATE: 03/05/2024 Therapeutic Exercise: Recumbent bike lvl 1.0 seat 7 for ROM - 10 mins  Hamstring stretch SLR strap 30 sec hold 3 reps during rests on leg press Gastroc stretch on step heel depression  30 sec hold 3 reps Quad stretch RLE over edge strap knee flexion  30 sec hold 3 reps PT added above 3 stretches and tandem stance to HEP with HO, demo & verbal cues.  Pt verbalized understanding after performing in PT.   Therapeutic Activities: Leg Press BLEs 75# 5 sec hold ext & flex 10 reps 2 sets;   Weight shift onto RLE  upon arising 3-5 sec 5 reps. Pt has less antalgic gait with this activity.  PT demo & verbal cues on Using 24" bar stool for standing ADLs to progress standing tolerance. Sit to/from stand demo. Pt verbalized understanding.  Tandem stance 30 sec RLE in front & in back; 1st rep floor eyes open, 2nd rep floor eyes closed, 3rd rep foam eyes open  Self-Care: PT demo & verbal cues on positioning in bed including "tenting" sheets off feet.  Pt verbalized understanding.   Vaso 10 mins Rt knee medium compression in elevation 34 deg.    TREATMENT                                                                          DATE:  02/29/2024 Therex: Recumbent bike lvl 1.0 seat 7 for ROM - 10 mins  Incline gastroc stretch 30 sec x 5 bilateral  Seated LAQ 3 lbs 2 x 15 with end range pauses each direction Supine heel slide AROM 5 sec hold x 10 (paired with quad set alternating)  Supine heel prop TKE stretch first 3 min of vaso  TherActivity: (to improve transfers, progressive mobility, stairs) Leg press double leg 62 lbs x15   , single leg Rt  leg 2 x 15 25 lbs  Sit to stand to sit from chair 18 inch without UE assist x 10 with slow lowering focus  Neuro Re-ed (balance control, muscle activation) Tandem stance 1 min x 2 bilateral with occasional HHA on bar Supine quad set 5 sec hold x 10 Rt leg   Vaso 10 mins Rt knee medium compression in elevation 34 deg.      PATIENT EDUCATION:  02/20/2024 Education details: HEP, POC Person educated: Patient Education method: Programmer, multimedia, Demonstration, Verbal cues, and Handouts Education comprehension: verbalized understanding, returned demonstration, and verbal cues required  HOME EXERCISE PROGRAM: Access Code: [email or web address] URL: [email or web address] Date: 03/05/2024 Prepared by: Lorie Rook  Exercises - Supine Heel Slide  - 2-3 x daily - 7 x weekly - 1 sets - 10 reps - 5 hold - Supine Quadricep Sets  - 2-3 x daily - 7 x weekly - 1 sets - 10 reps - 5 hold - Seated Long Arc Quad  - 2-3 x daily - 7 x weekly - 1 sets - 5-10 reps - 2 hold - Supine Knee Extension Mobilization with Weight  - 2-3 x daily - 7 x weekly - 1 sets - 1 reps - to tolerance up to 5 mins hold - Seated Knee Extension Stretch with Chair  - 2-3 x daily - 7 x weekly - 1 sets - 1 reps - 3-5 mins hold - Seated Quad Set  - 2-3 x daily - 7 x weekly - 1 sets - 10 reps - 5 hold - Seated Calf Stretch with Strap  - 2-3 x daily - 7 x weekly - 1 sets - 3-5 reps - 30 hold - Hooklying Hamstring Stretch with Strap  - 1-3 x daily - 7 x weekly - 1 sets - 3 reps - 30 seconds hold - Gastroc Stretch on Step  -  1-3 x daily - 7 x weekly - 1 sets - 3 reps - 30 seconds hold - Supine Quadriceps Stretch with Strap on Table  - 1-3 x daily - 7 x weekly - 1 sets - 3 reps - 30 seconds hold - Tandem Stance  - 1 x daily - 7 x weekly - 3 sets - 2 reps - 30 seconds hold  ASSESSMENT:  CLINICAL IMPRESSION: Patient is making good progress with his range of motion.  He is improving his functional strength as PT progresses exercises and activities.  He continues to benefit from skilled PT to progress his recovery status post knee replacement.  OBJECTIVE IMPAIRMENTS: Abnormal gait, decreased activity tolerance, decreased balance, decreased coordination, decreased endurance, decreased mobility, difficulty walking, decreased ROM, decreased strength, hypomobility, increased edema, increased fascial restrictions, impaired perceived functional ability, increased muscle spasms, impaired flexibility, improper body mechanics, and pain.   ACTIVITY LIMITATIONS: lifting, bending, sitting, standing, squatting, sleeping, stairs, transfers, bed mobility, and locomotion level  PARTICIPATION LIMITATIONS: meal prep, cleaning, laundry, interpersonal relationship, driving, shopping, community activity, and occupation  PERSONAL FACTORS:   PMH includes anemia, asthma, DM, HTN and Lt TKA 2024.  are also affecting patient's functional outcome.   REHAB POTENTIAL: Good  CLINICAL DECISION MAKING: Evolving/moderate complexity  EVALUATION COMPLEXITY: Moderate   GOALS: Goals reviewed with patient? Yes  SHORT TERM GOALS: (target date for Short term goals are 3 weeks 03/12/2024)   1.  Patient will demonstrate independent use of home exercise program to maintain progress from in clinic treatments.  Goal status: Met  LONG TERM GOALS: (target dates for all long term goals are 10  weeks  04/30/2024 )   1. Patient will demonstrate/report pain at worst less than or equal to 2/10 to facilitate minimal limitation in daily activity secondary to pain  symptoms.  Goal status: Ongoing   03/06/2024   2. Patient will demonstrate independent use of home exercise program to facilitate ability to maintain/progress functional gains from skilled physical therapy services.  Goal status: Ongoing   03/06/2024   3. Patient will demonstrate Patient specific functional scale avg > or = 8/10 to indicate reduced disability due to condition.   Goal status:  Ongoing   03/06/2024   4.  Patient will demonstrate Rt LE MMT 5/5 throughout to faciltiate usual transfers, stairs, squatting at [facility] for daily life.   Goal status: Ongoing   03/06/2024   5.  Patient will demonstrate Rt knee AROM 0-110 deg to facilitate transfers and other daily mobility at PLOF.  Goal status: New   6.  Patient will demonstrate independent ambulation > 500 ft s deviation for community integration. Goal status: Ongoing   03/06/2024   7.  Patient will demonstrate ascending/descending stairs reciprocally s UE assist for community integration.   Goal Status: Ongoing   03/06/2024   PLAN:  PT FREQUENCY: 1-2x/week  PT DURATION: 10 weeks  PLANNED INTERVENTIONS: Can include 82956- PT Re-evaluation, 97110-Therapeutic exercises, 97530- Therapeutic activity, 97112- Neuromuscular re-education, 97535- Self Care, 97140- Manual therapy, [phone]- Gait training, [phone]- Orthotic Fit/training, [phone]- Canalith repositioning, [id number]- Aquatic Therapy, [phone]- Electrical stimulation (unattended), [phone]- Electrical stimulation (manual), [id number] Physical performance testing, 97016- Vasopneumatic device, [id number]- Ultrasound, [id number]- Traction (mechanical), [id number]- Ionotophoresis 4mg /ml Dexamethasone , Patient/Family education, Balance training, Stair training, Taping, Dry Needling, Joint mobilization, Joint manipulation, Spinal manipulation, Spinal mobilization, Scar mobilization, Vestibular training, Visual/preceptual remediation/compensation, DME instructions, Cryotherapy, and Moist heat.  All performed as medically  necessary.  All included unless contraindicated  PLAN FOR NEXT SESSION: Check MD note.  Continued strengthening, balance. And mobility gains.    Lorie Rook, PT, DPT 03/06/2024, 3:41 PM

## 2024-03-11 ENCOUNTER — Encounter: Payer: Self-pay | Admitting: Internal Medicine

## 2024-03-12 ENCOUNTER — Ambulatory Visit (INDEPENDENT_AMBULATORY_CARE_PROVIDER_SITE_OTHER): Admitting: Physician Assistant

## 2024-03-12 ENCOUNTER — Encounter: Payer: Self-pay | Admitting: Physical Therapy

## 2024-03-12 ENCOUNTER — Ambulatory Visit (INDEPENDENT_AMBULATORY_CARE_PROVIDER_SITE_OTHER): Admitting: Physical Therapy

## 2024-03-12 ENCOUNTER — Other Ambulatory Visit (INDEPENDENT_AMBULATORY_CARE_PROVIDER_SITE_OTHER): Payer: Self-pay

## 2024-03-12 DIAGNOSIS — Z96651 Presence of right artificial knee joint: Secondary | ICD-10-CM | POA: Diagnosis not present

## 2024-03-12 DIAGNOSIS — R262 Difficulty in walking, not elsewhere classified: Secondary | ICD-10-CM

## 2024-03-12 DIAGNOSIS — M25661 Stiffness of right knee, not elsewhere classified: Secondary | ICD-10-CM | POA: Diagnosis not present

## 2024-03-12 DIAGNOSIS — M25561 Pain in right knee: Secondary | ICD-10-CM

## 2024-03-12 DIAGNOSIS — G8929 Other chronic pain: Secondary | ICD-10-CM

## 2024-03-12 DIAGNOSIS — M6281 Muscle weakness (generalized): Secondary | ICD-10-CM

## 2024-03-12 MED ORDER — HYDROCODONE-ACETAMINOPHEN 5-325 MG PO TABS: 1.0000 | ORAL_TABLET | Freq: Two times a day (BID) | ORAL | 0 refills | Status: DC | PRN

## 2024-03-12 NOTE — Progress Notes (Signed)
 Post-Op Visit Note   Patient: Craig House           Date of Birth: July 17, 1961           MRN: [id number] Visit Date: 03/12/2024 PCP: Sylvia Everts, PA-C   Assessment & Plan:  Chief Complaint:  Chief Complaint  Patient presents with   Right Knee - Routine Post Op   Visit Diagnoses:  1. Status post right knee replacement     Plan: Patient is a pleasant 63 year old gentleman who comes in today 6 weeks status post right total knee replacement 01/29/2024.  He has been doing much better.  He still notes some discomfort with therapy and at night.  He is taking Tylenol  and Robaxin .  He has been compliant taking a baby aspirin  for DVT prophylaxis.  Examination of his right knee reveals a fully healed surgical scar without complication.  Range of motion 5 to 100 degrees.  He is stable valgus varus stress.  He is neurovascularly intact distally.  At this point, he may discontinue his aspirin  from a DVT standpoint.  I have sent in Norco to hopefully help facilitate range of motion with PT and to help at night.  He will follow-up in 6 weeks for recheck.  Call with concerns or questions.  Follow-Up Instructions: Return in about 6 weeks (around 04/23/2024).   Orders:  Orders Placed This Encounter  Procedures   XR Knee 1-2 Views Right   Meds ordered this encounter  Medications   HYDROcodone-acetaminophen  (NORCO/VICODIN) 5-325 MG tablet    Sig: Take 1-2 tablets by mouth 2 (two) times daily as needed for moderate pain (pain score 4-6).    Dispense:  30 tablet    Refill:  0    Imaging: XR Knee 1-2 Views Right Result Date: 03/12/2024 Well-seated prosthesis without complication   PMFS History: Patient Active Problem List   Diagnosis Date Noted   Status post total right knee replacement 01/29/2024   Primary osteoarthritis of right knee 12/26/2023   Status post total left knee replacement 01/09/2023   Bilateral primary osteoarthritis of knee 06/19/2018   Mild intermittent asthma,  uncomplicated 01/18/2016   HTN (hypertension) 12/31/2011   Past Medical History:  Diagnosis Date   Allergy    spring   Anemia    Arthritis    Asthma    Diabetes mellitus without complication (HCC)    type II   Hyperplastic colon polyp    Hypertension    Obesity     Family History  Problem Relation Age of Onset   Heart attack Mother    Cancer Father    Heart disease Father    Colon cancer Neg Hx    Colon polyps Neg Hx    Esophageal cancer Neg Hx    Prostate cancer Neg Hx    Rectal cancer Neg Hx     Past Surgical History:  Procedure Laterality Date   KNEE ARTHROSCOPY Bilateral    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   Procedure: LEFT TOTAL KNEE ARTHROPLASTY;  Surgeon: Wes Hamman, MD;  Location: [facility] OR;  Service: Orthopedics;  Laterality: Left;   TOTAL KNEE ARTHROPLASTY Right 01/29/2024   Procedure: RIGHT TOTAL KNEE ARTHROPLASTY;  Surgeon: Wes Hamman, MD;  Location: [facility] OR;  Service: Orthopedics;  Laterality: Right;   Social History   Occupational History   Not on file  Tobacco Use   Smoking status: Former   Smokeless tobacco: Never  Vaping Use   Vaping status: Never Used  Substance and Sexual Activity   Alcohol use: Yes    Alcohol/week: 1.0 standard drink of alcohol    Types: 1 Standard drinks or equivalent per week    Comment: 1 glass of red wine at night   Drug use: No   Sexual activity: Yes

## 2024-03-12 NOTE — Therapy (Signed)
 OUTPATIENT PHYSICAL THERAPY TREATMENT   Patient Name: Craig House MRN: [id number] DOB:1961/05/14, 63 y.o., male Today's Date: 03/12/2024  END OF SESSION:  PT End of Session - 03/12/24 1514     Visit Number 7    Number of Visits 20    Date for PT Re-Evaluation 04/30/24    Authorization Type [employer]    Progress Note Due on Visit 10    PT Start Time 1430    PT Stop Time 1515    PT Time Calculation (min) 45 min    Activity Tolerance Patient tolerated treatment well    Behavior During Therapy [facility] for tasks assessed/performed                   Past Medical History:  Diagnosis Date   Allergy    spring   Anemia    Arthritis    Asthma    Diabetes mellitus without complication (HCC)    type II   Hyperplastic colon polyp    Hypertension    Obesity    Past Surgical History:  Procedure Laterality Date   KNEE ARTHROSCOPY Bilateral    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   Procedure: LEFT TOTAL KNEE ARTHROPLASTY;  Surgeon: Wes Hamman, MD;  Location: [facility] OR;  Service: Orthopedics;  Laterality: Left;   TOTAL KNEE ARTHROPLASTY Right 01/29/2024   Procedure: RIGHT TOTAL KNEE ARTHROPLASTY;  Surgeon: Wes Hamman, MD;  Location: [facility] OR;  Service: Orthopedics;  Laterality: Right;   Patient Active Problem List   Diagnosis Date Noted   Status post total right knee replacement 01/29/2024   Primary osteoarthritis of right knee 12/26/2023   Status post total left knee replacement 01/09/2023   Bilateral primary osteoarthritis of knee 06/19/2018   Mild intermittent asthma, uncomplicated 01/18/2016   HTN (hypertension) 12/31/2011    PCP: Sylvia Everts, PA - C  REFERRING PROVIDER: Sandie Cross, PA-C  REFERRING DIAG: [phone] (ICD-10-CM) - Status post total right knee replacement  THERAPY DIAG:  Chronic pain of right knee  Stiffness of right knee, not elsewhere classified  Muscle weakness (generalized)  Difficulty in walking, not elsewhere  classified  Rationale for Evaluation and Treatment: Rehabilitation  ONSET DATE: 01/29/2024 surgery  SUBJECTIVE:   SUBJECTIVE STATEMENT: Pt stating his MD prescribed more pain meds to use at night and before theapy.   PERTINENT HISTORY: PMH includes anemia, asthma, DM, HTN and Lt TKA 2024.  PAIN:  NPRS scale: 5/10 today, 8/10 at night Pain location: Rt knee  Pain description: sharp pain, ache/tightness.  Aggravating factors: static positioning, end ranges, WB pressure.  Relieving factors: medicine, ice  PRECAUTIONS: None  WEIGHT BEARING RESTRICTIONS: No  FALLS:  Has patient fallen in last 6 months? No  LIVING ENVIRONMENT: Lives with:  girlfriend Lives in: House/apartment Stairs: flight to bedroom, stairs to enter house with handrail Has following equipment at home: [facility], FWW  OCCUPATION: [employer].  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  Waist to waist lifting.   PLOF: Independent, walking for exercise, previous bike riding.  [employer]  PATIENT GOALS: Reduce pain, get back to work and activity.   OBJECTIVE:   PATIENT SURVEYS:  Patient-Specific Activity Scoring Scheme  "0" represents "unable to perform." "10" represents "able to perform at prior level. 0 1 2 3 4 5 6 7 8 9  10 (Date and Score)   Activity Eval  02/20/2024 03/12/24   1. Stairs  2  3   2. standing 4 6  3. showering 4 4  4.  Prolonged static positioning (sleeping, sitting) 2 3  5.    Score 3 avg 4   Total score = sum of the activity scores/number of activities Minimum detectable change (90%CI) for average score = 2 points Minimum detectable change (90%CI) for single activity score = 3 points  COGNITION: 02/20/2024 Overall cognitive status: WFL    SENSATION: 02/20/2024 Not tested  EDEMA:  02/20/2024 Localized edema noted in Rt knee/lower leg   MUSCLE LENGTH: 02/20/2024 No specific testing  POSTURE:  02/20/2024 Nothing specific noted impacting Rt knee other than weight  shift towards Lt in standing.   PALPATION: 02/20/2024 Mild tenderness anterior knee joint, incision area.   LOWER EXTREMITY ROM:   ROM Right Eval 02/20/2024 Right 02/29/24 Right 03/06/24 Rt 03/12/24  Hip flexion      Hip extension      Hip abduction      Hip adduction      Hip internal rotation      Hip external rotation      Knee flexion 109 AROM in supine heel slide  106 in supine AROM heel slide Seated P: 113* A: 107* Seated A: 108 P: 112 Supine:  A:   Knee extension -31 AROM in seated LAQ  Supine heel prop: -11  -6 in supine quad set LAQ -7* Supine: A: -7 P; -5   Ankle dorsiflexion      Ankle plantarflexion      Ankle inversion      Ankle eversion       (Blank rows = not tested)  LOWER EXTREMITY MMT:  MMT Right Eval 02/20/2024 Left Eval 02/20/2024 Right 03/06/24  Hip flexion 5/5 5/5   Hip extension     Hip abduction     Hip adduction     Hip internal rotation     Hip external rotation     Knee flexion 4/5 5/5   Knee extension 3+/5 5/5   Ankle dorsiflexion 5/5 5/5   Ankle plantarflexion     Ankle inversion     Ankle eversion      (Blank rows = not tested)  LOWER EXTREMITY SPECIAL TESTS:  02/20/2024 No specific testing today  FUNCTIONAL TESTS:  02/20/2024 18 inch chair transfer: able to perform without UE assist after several tries with deviation to Lt LE Lt SLS: 10 seconds Rt SLS: unable unassisted.   GAIT: 02/20/2024 Distance walked: household distances  Assistive device utilized: SPC use in Lt UE Level of assistance: Modified independence Comments: reduced stance on Rt leg, maintained knee flexion  TODAY'S TREATMENT                                                                          DATE:  03/12/2024 Therapeutic Exercise: scifit bike lvl 3.0 seat 10 for ROM/strengthening - 10 mins   Calf stretch: slant board x 2 holding 30 sec Therapeutic Activities: Sit to stand: x 10  Step up and down 6 inch step x 15 (up with right down with left) Step down on 4 inch step c heel taps x 10  Side stepping x 6 in parallel bars Manual Therapy: Grade 3-4 mobs with traction & tibial IR for knee flexion range Vaso 10 mins Rt knee medium compression in elevation ext stretch 34 deg.     03/06/2024 Therapeutic Exercise: Recumbent bike lvl 1.0 seat 7 for ROM - 8 mins  Seated LAQ with end range ankle DF & contralateral knee flexion to facilitate stronger quad contraction 10 reps 2 sets  Therapeutic Activities: Tandem stance 30 sec RLE in front & in back; 1st rep floor eyes open, 2nd rep floor eyes closed, 3rd rep foam eyes open.  At sink for HEP. Pt verbalized understanding.  PT demo & verbal cues on sit to/from stand technique. Pt performed 10 reps with PT cues to improve form.  Stairs: PT demo & verbal cues on technique. 11 steps step-to pattern using RLE 1st rep 2 rails, 2nd rep single rail (8 steps left rail, 3 steps right rail) and cane.    Manual Therapy: Grade IV mob with traction & tibial int rot for knee flexion range  Vaso 10 mins Rt knee medium compression in elevation ext stretch 34 deg.    TREATMENT                                                                          DATE: 03/05/2024 Therapeutic Exercise: Recumbent bike lvl 1.0 seat 7 for ROM - 10 mins  Hamstring stretch SLR strap 30 sec hold 3 reps during rests on leg press Gastroc stretch on step heel depression  30 sec hold 3 reps Quad stretch RLE over edge strap knee flexion  30 sec hold 3 reps PT added above 3 stretches and tandem stance to HEP with HO, demo & verbal cues.  Pt verbalized understanding after performing in PT.   Therapeutic Activities: Leg Press BLEs 75# 5 sec hold ext & flex 10 reps 2 sets;   Weight shift onto RLE  upon arising 3-5 sec 5 reps. Pt has less antalgic gait with this activity.  PT  demo & verbal cues on Using 24" bar stool for standing ADLs to progress standing tolerance. Sit to/from stand demo. Pt verbalized understanding.  Tandem stance 30 sec RLE in front & in back; 1st rep floor eyes open, 2nd rep floor eyes closed, 3rd rep foam eyes open  Self-Care: PT demo & verbal cues on positioning in bed including "tenting" sheets off feet.  Pt verbalized understanding.  Vaso 10 mins Rt knee medium compression in elevation 34 deg.      PATIENT EDUCATION:  02/20/2024 Education details: HEP, POC Person educated: Patient Education method: Programmer, multimedia, Demonstration, Verbal cues, and Handouts Education comprehension: verbalized understanding, returned demonstration, and verbal cues required  HOME EXERCISE PROGRAM: Access Code: [email or web address] URL: [email or web address] Date: 03/05/2024 Prepared by: Lorie Rook  Exercises - Supine Heel Slide  - 2-3 x daily - 7 x weekly - 1 sets - 10 reps - 5 hold - Supine Quadricep Sets  - 2-3 x daily - 7 x weekly - 1 sets - 10 reps - 5 hold - Seated Long Arc Quad  - 2-3 x daily - 7 x weekly - 1 sets - 5-10 reps - 2 hold - Supine Knee Extension Mobilization with Weight  - 2-3 x daily - 7 x weekly - 1 sets - 1 reps - to tolerance up to 5 mins hold - Seated Knee Extension Stretch with Chair  - 2-3 x daily - 7 x weekly - 1 sets - 1 reps - 3-5 mins hold - Seated Quad Set  - 2-3 x daily - 7 x weekly - 1 sets - 10 reps - 5 hold - Seated Calf Stretch with Strap  - 2-3 x daily - 7 x weekly - 1 sets - 3-5 reps - 30 hold - Hooklying Hamstring Stretch with Strap  - 1-3 x daily - 7 x weekly - 1 sets - 3 reps - 30 seconds hold - Gastroc Stretch on Step  - 1-3 x daily - 7 x weekly - 1 sets - 3 reps - 30 seconds hold - Supine Quadriceps Stretch with Strap on Table  - 1-3 x daily - 7 x weekly - 1 sets - 3 reps - 30 seconds hold - Tandem Stance  - 1 x daily - 7 x weekly - 3 sets - 2 reps - 30 seconds hold  ASSESSMENT:  CLINICAL  IMPRESSION: Pt arriving today reporting 5/10 pain in his left knee. Pt reporting progress with his functional mobility at home and his Self Rated activity score increased to 3 points. Recommending continuing skilled PT interventions.   OBJECTIVE IMPAIRMENTS: Abnormal gait, decreased activity tolerance, decreased balance, decreased coordination, decreased endurance, decreased mobility, difficulty walking, decreased ROM, decreased strength, hypomobility, increased edema, increased fascial restrictions, impaired perceived functional ability, increased muscle spasms, impaired flexibility, improper body mechanics, and pain.   ACTIVITY LIMITATIONS: lifting, bending, sitting, standing, squatting, sleeping, stairs, transfers, bed mobility, and locomotion level  PARTICIPATION LIMITATIONS: meal prep, cleaning, laundry, interpersonal relationship, driving, shopping, community activity, and occupation  PERSONAL FACTORS:   PMH includes anemia, asthma, DM, HTN and Lt TKA 2024.  are also affecting patient's functional outcome.   REHAB POTENTIAL: Good  CLINICAL DECISION MAKING: Evolving/moderate complexity  EVALUATION COMPLEXITY: Moderate   GOALS: Goals reviewed with patient? Yes  SHORT TERM GOALS: (target date for Short term goals are 3 weeks 03/12/2024)   1.  Patient will demonstrate independent use of home exercise program to maintain progress from in clinic treatments.  Goal status: Met  LONG TERM GOALS: (target dates for all long term goals are 10 weeks  04/30/2024 )   1. Patient will demonstrate/report pain at worst less than or equal to 2/10 to facilitate minimal limitation in daily activity secondary to pain symptoms.  Goal status: Ongoing   03/12/2024   2. Patient will demonstrate independent use of home exercise program to facilitate ability to maintain/progress functional gains from skilled physical therapy  services.  Goal status: Ongoing   4/292025   3. Patient will demonstrate Patient  specific functional scale avg > or = 8/10 to indicate reduced disability due to condition.   Goal status:  Ongoing   03/12/2024   4.  Patient will demonstrate Rt LE MMT 5/5 throughout to faciltiate usual transfers, stairs, squatting at [facility] for daily life.   Goal status: Ongoing   03/12/2024   5.  Patient will demonstrate Rt knee AROM 0-110 deg to facilitate transfers and other daily mobility at PLOF.  Goal status: New   6.  Patient will demonstrate independent ambulation > 500 ft s deviation for community integration. Goal status: Ongoing   03/12/2024 (using straight cane)   7.  Patient will demonstrate ascending/descending stairs reciprocally s UE assist for community integration.   Goal Status: Ongoing   03/12/2024   PLAN:  PT FREQUENCY: 1-2x/week  PT DURATION: 10 weeks  PLANNED INTERVENTIONS: Can include 29562- PT Re-evaluation, 97110-Therapeutic exercises, 97530- Therapeutic activity, 97112- Neuromuscular re-education, 97535- Self Care, 97140- Manual therapy, [phone]- Gait training, [phone]- Orthotic Fit/training, [phone]- Canalith repositioning, [id number]- Aquatic Therapy, [phone]- Electrical stimulation (unattended), [phone]- Electrical stimulation (manual), [id number] Physical performance testing, 97016- Vasopneumatic device, [id number]- Ultrasound, [id number]- Traction (mechanical), [id number]- Ionotophoresis 4mg /ml Dexamethasone , Patient/Family education, Balance training, Stair training, Taping, Dry Needling, Joint mobilization, Joint manipulation, Spinal manipulation, Spinal mobilization, Scar mobilization, Vestibular training, Visual/preceptual remediation/compensation, DME instructions, Cryotherapy, and Moist heat.  All performed as medically necessary.  All included unless contraindicated  PLAN FOR NEXT SESSION: Continued strengthening, balance and mobility gains.    Marysue Sola, PT, MPT 03/12/2024, 3:17 PM

## 2024-03-14 ENCOUNTER — Encounter: Payer: Self-pay | Admitting: Physical Therapy

## 2024-03-14 ENCOUNTER — Ambulatory Visit: Admitting: Physical Therapy

## 2024-03-14 DIAGNOSIS — M6281 Muscle weakness (generalized): Secondary | ICD-10-CM | POA: Diagnosis not present

## 2024-03-14 DIAGNOSIS — M25661 Stiffness of right knee, not elsewhere classified: Secondary | ICD-10-CM

## 2024-03-14 DIAGNOSIS — G8929 Other chronic pain: Secondary | ICD-10-CM

## 2024-03-14 DIAGNOSIS — R600 Localized edema: Secondary | ICD-10-CM

## 2024-03-14 DIAGNOSIS — M25561 Pain in right knee: Secondary | ICD-10-CM

## 2024-03-14 DIAGNOSIS — R262 Difficulty in walking, not elsewhere classified: Secondary | ICD-10-CM

## 2024-03-14 NOTE — Therapy (Signed)
 OUTPATIENT PHYSICAL THERAPY TREATMENT   Patient Name: Craig House MRN: [id number] DOB:1961/11/07, 63 y.o., male Today's Date: 03/14/2024  END OF SESSION:  PT End of Session - 03/14/24 1522     Visit Number 8    Number of Visits 20    Date for PT Re-Evaluation 04/30/24    Authorization Type [employer]    Progress Note Due on Visit 10    PT Start Time 1515    PT Stop Time 1605    PT Time Calculation (min) 50 min    Activity Tolerance Patient tolerated treatment well    Behavior During Therapy [facility] for tasks assessed/performed                    Past Medical History:  Diagnosis Date   Allergy    spring   Anemia    Arthritis    Asthma    Diabetes mellitus without complication (HCC)    type II   Hyperplastic colon polyp    Hypertension    Obesity    Past Surgical History:  Procedure Laterality Date   KNEE ARTHROSCOPY Bilateral    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   Procedure: LEFT TOTAL KNEE ARTHROPLASTY;  Surgeon: Wes Hamman, MD;  Location: [facility] OR;  Service: Orthopedics;  Laterality: Left;   TOTAL KNEE ARTHROPLASTY Right 01/29/2024   Procedure: RIGHT TOTAL KNEE ARTHROPLASTY;  Surgeon: Wes Hamman, MD;  Location: [facility] OR;  Service: Orthopedics;  Laterality: Right;   Patient Active Problem List   Diagnosis Date Noted   Status post total right knee replacement 01/29/2024   Primary osteoarthritis of right knee 12/26/2023   Status post total left knee replacement 01/09/2023   Bilateral primary osteoarthritis of knee 06/19/2018   Mild intermittent asthma, uncomplicated 01/18/2016   HTN (hypertension) 12/31/2011    PCP: Sylvia Everts, PA - C  REFERRING PROVIDER: Sandie Cross, PA-C  REFERRING DIAG: [phone] (ICD-10-CM) - Status post total right knee replacement  THERAPY DIAG:  Chronic pain of right knee  Stiffness of right knee, not elsewhere classified  Muscle weakness (generalized)  Difficulty in walking, not elsewhere  classified  Localized edema  Rationale for Evaluation and Treatment: Rehabilitation  ONSET DATE: 01/29/2024 surgery  SUBJECTIVE:   SUBJECTIVE STATEMENT: Relays his knee feels good today, denies pain.  PERTINENT HISTORY: PMH includes anemia, asthma, DM, HTN and Lt TKA 2024.  PAIN:  NPRS scale: no pain to report today Pain location: Rt knee  Pain description: sharp pain, ache/tightness.  Aggravating factors: static positioning, end ranges, WB pressure.  Relieving factors: medicine, ice  PRECAUTIONS: None  WEIGHT BEARING RESTRICTIONS: No  FALLS:  Has patient fallen in last 6 months? No  LIVING ENVIRONMENT: Lives with:  girlfriend Lives in: House/apartment Stairs: flight to bedroom, stairs to enter house with handrail Has following equipment at home: [employer], FWW  OCCUPATION: [employer].  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  Waist to waist lifting.   PLOF: Independent, walking for exercise, previous bike riding.  [employer]  PATIENT GOALS: Reduce pain, get back to work and activity.   OBJECTIVE:   PATIENT SURVEYS:  Patient-Specific Activity Scoring Scheme  "0" represents "unable to perform." "10" represents "able to perform at prior level. 0 1 2 3 4 5 6 7 8 9  10 (Date and Score)   Activity Eval  02/20/2024 03/12/24   1. Stairs  2  3   2. standing 4 6   3. showering  4 4  4.  Prolonged static positioning (sleeping, sitting) 2 3  5.    Score 3 avg 4   Total score = sum of the activity scores/number of activities Minimum detectable change (90%CI) for average score = 2 points Minimum detectable change (90%CI) for single activity score = 3 points  COGNITION: 02/20/2024 Overall cognitive status: WFL    SENSATION: 02/20/2024 Not tested  EDEMA:  02/20/2024 Localized edema noted in Rt knee/lower leg   MUSCLE LENGTH: 02/20/2024 No specific testing  POSTURE:  02/20/2024 Nothing specific noted impacting Rt knee other than weight shift towards Lt  in standing.   PALPATION: 02/20/2024 Mild tenderness anterior knee joint, incision area.   LOWER EXTREMITY ROM:   ROM Right Eval 02/20/2024 Right 02/29/24 Right 03/06/24 Rt 03/12/24  Hip flexion      Hip extension      Hip abduction      Hip adduction      Hip internal rotation      Hip external rotation      Knee flexion 109 AROM in supine heel slide  106 in supine AROM heel slide Seated P: 113* A: 107* Seated A: 108 P: 112 Supine:  A:   Knee extension -31 AROM in seated LAQ  Supine heel prop: -11  -6 in supine quad set LAQ -7* Supine: A: -7 P; -5   Ankle dorsiflexion      Ankle plantarflexion      Ankle inversion      Ankle eversion       (Blank rows = not tested)  LOWER EXTREMITY MMT:  MMT Right Eval 02/20/2024 Left Eval 02/20/2024 Right 03/06/24  Hip flexion 5/5 5/5   Hip extension     Hip abduction     Hip adduction     Hip internal rotation     Hip external rotation     Knee flexion 4/5 5/5   Knee extension 3+/5 5/5   Ankle dorsiflexion 5/5 5/5   Ankle plantarflexion     Ankle inversion     Ankle eversion      (Blank rows = not tested)  LOWER EXTREMITY SPECIAL TESTS:  02/20/2024 No specific testing today  FUNCTIONAL TESTS:  02/20/2024 18 inch chair transfer: able to perform without UE assist after several tries with deviation to Lt LE Lt SLS: 10 seconds Rt SLS: unable unassisted.   GAIT: 02/20/2024 Distance walked: household distances  Assistive device utilized: SPC use in Lt UE Level of assistance: Modified independence Comments: reduced stance on Rt leg, maintained knee flexion  TODAY'S TREATMENT                                                                          DATE:  03/14/2024 Therapeutic Exercise: scifit bike lvl 3.0 seat 10 for ROM/strengthening - 10 mins  Calf stretch:  slant board x 3 holding 30 sec Therapeutic Activities: (strength Sit to stand: x 10  Step up and down 6 inch step x 15 (up with right down with left) Leg press DL 409# 8J19, then Right leg only 56# 2X10 Side stepping x 3 in parallel bars without UE suppport Walking forwad/backward in //bars without UE support X 3 trips Seated LAQ 3# 2X10 Manual Therapy: Grade 3-4 mobs with traction & tibial IR for knee flexion range Vaso 10 mins Rt knee medium compression in elevation ext stretch 34 deg.    03/12/2024 Therapeutic Exercise: scifit bike lvl 3.0 seat 10 for ROM/strengthening - 10 mins  Calf stretch: slant board x 2 holding 30 sec Therapeutic Activities: Sit to stand: x 10  Step up and down 6 inch step x 15 (up with right down with left) Step down on 4 inch step c heel taps x 10  Side stepping x 6 in parallel bars Manual Therapy: Grade 3-4 mobs with traction & tibial IR for knee flexion range Vaso 10 mins Rt knee medium compression in elevation ext stretch 34 deg.     03/06/2024 Therapeutic Exercise: Recumbent bike lvl 1.0 seat 7 for ROM - 8 mins  Seated LAQ with end range ankle DF & contralateral knee flexion to facilitate stronger quad contraction 10 reps 2 sets  Therapeutic Activities: Tandem stance 30 sec RLE in front & in back; 1st rep floor eyes open, 2nd rep floor eyes closed, 3rd rep foam eyes open.  At sink for HEP. Pt verbalized understanding.  PT demo & verbal cues on sit to/from stand technique. Pt performed 10 reps with PT cues to improve form.  Stairs: PT demo & verbal cues on technique. 11 steps step-to pattern using RLE 1st rep 2 rails, 2nd rep single rail (8 steps left rail, 3 steps right rail) and cane.    Manual Therapy: Grade IV mob with traction & tibial int rot for knee flexion range  Vaso 10 mins Rt knee medium compression in elevation ext stretch 34 deg.    TREATMENT                                                                          DATE:  03/05/2024 Therapeutic Exercise: Recumbent bike lvl 1.0 seat 7 for ROM - 10 mins  Hamstring stretch SLR strap 30 sec hold 3 reps during rests on leg press Gastroc stretch on step heel depression  30 sec hold 3 reps Quad stretch RLE over edge strap knee flexion  30 sec hold 3 reps PT added above 3 stretches and tandem stance to HEP with HO, demo & verbal cues.  Pt verbalized understanding after  performing in PT.   Therapeutic Activities: Leg Press BLEs 75# 5 sec hold ext & flex 10 reps 2 sets;   Weight shift onto RLE  upon arising 3-5 sec 5 reps. Pt has less antalgic gait with this activity.  PT demo & verbal cues on Using 24" bar stool for standing ADLs to progress standing tolerance. Sit to/from stand demo. Pt verbalized understanding.  Tandem stance 30 sec RLE in front & in back; 1st rep floor eyes open, 2nd rep floor eyes closed, 3rd rep foam eyes open  Self-Care: PT demo & verbal cues on positioning in bed including "tenting" sheets off feet.  Pt verbalized understanding.   Vaso 10 mins Rt knee medium compression in elevation 34 deg.      PATIENT EDUCATION:  02/20/2024 Education details: HEP, POC Person educated: Patient Education method: Programmer, multimedia, Demonstration, Verbal cues, and Handouts Education comprehension: verbalized understanding, returned demonstration, and verbal cues required  HOME EXERCISE PROGRAM: Access Code: [email or web address] URL: [email or web address] Date: 03/05/2024 Prepared by: Lorie Rook  Exercises - Supine Heel Slide  - 2-3 x daily - 7 x weekly - 1 sets - 10 reps - 5 hold - Supine Quadricep Sets  - 2-3 x daily - 7 x weekly - 1 sets - 10 reps - 5 hold - Seated Long Arc Quad  - 2-3 x daily - 7 x weekly - 1 sets - 5-10 reps - 2 hold - Supine Knee Extension Mobilization with Weight  - 2-3 x daily - 7 x weekly - 1 sets - 1 reps - to tolerance up to 5 mins hold - Seated Knee Extension Stretch with Chair  - 2-3 x daily - 7 x weekly - 1 sets - 1 reps  - 3-5 mins hold - Seated Quad Set  - 2-3 x daily - 7 x weekly - 1 sets - 10 reps - 5 hold - Seated Calf Stretch with Strap  - 2-3 x daily - 7 x weekly - 1 sets - 3-5 reps - 30 hold - Hooklying Hamstring Stretch with Strap  - 1-3 x daily - 7 x weekly - 1 sets - 3 reps - 30 seconds hold - Gastroc Stretch on Step  - 1-3 x daily - 7 x weekly - 1 sets - 3 reps - 30 seconds hold - Supine Quadriceps Stretch with Strap on Table  - 1-3 x daily - 7 x weekly - 1 sets - 3 reps - 30 seconds hold - Tandem Stance  - 1 x daily - 7 x weekly - 3 sets - 2 reps - 30 seconds hold  ASSESSMENT:  CLINICAL IMPRESSION:He arrives doing great and not having pain today. Overall appears to be progressing well.    OBJECTIVE IMPAIRMENTS: Abnormal gait, decreased activity tolerance, decreased balance, decreased coordination, decreased endurance, decreased mobility, difficulty walking, decreased ROM, decreased strength, hypomobility, increased edema, increased fascial restrictions, impaired perceived functional ability, increased muscle spasms, impaired flexibility, improper body mechanics, and pain.   ACTIVITY LIMITATIONS: lifting, bending, sitting, standing, squatting, sleeping, stairs, transfers, bed mobility, and locomotion level  PARTICIPATION LIMITATIONS: meal prep, cleaning, laundry, interpersonal relationship, driving, shopping, community activity, and occupation  PERSONAL FACTORS:   PMH includes anemia, asthma, DM, HTN and Lt TKA 2024.  are also affecting patient's functional outcome.   REHAB POTENTIAL: Good  CLINICAL DECISION MAKING: Evolving/moderate complexity  EVALUATION COMPLEXITY: Moderate   GOALS: Goals reviewed with patient? Yes  SHORT TERM GOALS: (target date for Short term goals are 3 weeks 03/12/2024)  1.  Patient will demonstrate independent use of home exercise program to maintain progress from in clinic treatments.  Goal status: Met  LONG TERM GOALS: (target dates for all long term goals are  10 weeks  04/30/2024 )   1. Patient will demonstrate/report pain at worst less than or equal to 2/10 to facilitate minimal limitation in daily activity secondary to pain symptoms.  Goal status: Ongoing   03/12/2024   2. Patient will demonstrate independent use of home exercise program to facilitate ability to maintain/progress functional gains from skilled physical therapy services.  Goal status: Ongoing   4/292025   3. Patient will demonstrate Patient specific functional scale avg > or = 8/10 to indicate reduced disability due to condition.   Goal status:  Ongoing   03/12/2024   4.  Patient will demonstrate Rt LE MMT 5/5 throughout to faciltiate usual transfers, stairs, squatting at [facility] for daily life.   Goal status: Ongoing   03/12/2024   5.  Patient will demonstrate Rt knee AROM 0-110 deg to facilitate transfers and other daily mobility at PLOF.  Goal status: New   6.  Patient will demonstrate independent ambulation > 500 ft s deviation for community integration. Goal status: Ongoing   03/12/2024 (using straight cane)   7.  Patient will demonstrate ascending/descending stairs reciprocally s UE assist for community integration.   Goal Status: Ongoing   03/12/2024   PLAN:  PT FREQUENCY: 1-2x/week  PT DURATION: 10 weeks  PLANNED INTERVENTIONS: Can include 16109- PT Re-evaluation, 97110-Therapeutic exercises, 97530- Therapeutic activity, 97112- Neuromuscular re-education, 97535- Self Care, 97140- Manual therapy, [phone]- Gait training, [phone]- Orthotic Fit/training, [phone]- Canalith repositioning, [id number]- Aquatic Therapy, [phone]- Electrical stimulation (unattended), [phone]- Electrical stimulation (manual), [id number] Physical performance testing, 97016- Vasopneumatic device, [id number]- Ultrasound, [id number]- Traction (mechanical), [id number]- Ionotophoresis 4mg /ml Dexamethasone , Patient/Family education, Balance training, Stair training, Taping, Dry Needling, Joint mobilization, Joint manipulation, Spinal  manipulation, Spinal mobilization, Scar mobilization, Vestibular training, Visual/preceptual remediation/compensation, DME instructions, Cryotherapy, and Moist heat.  All performed as medically necessary.  All included unless contraindicated  PLAN FOR NEXT SESSION: Continued strengthening, balance and mobility gains.    Mick Alamin, PT, DPT 03/14/2024, 3:57 PM

## 2024-03-19 ENCOUNTER — Ambulatory Visit (AMBULATORY_SURGERY_CENTER): Admitting: Internal Medicine

## 2024-03-19 ENCOUNTER — Other Ambulatory Visit: Payer: Self-pay | Admitting: Medical

## 2024-03-19 ENCOUNTER — Encounter: Admitting: Rehabilitative and Restorative Service Providers"

## 2024-03-19 ENCOUNTER — Encounter: Payer: Self-pay | Admitting: Internal Medicine

## 2024-03-19 VITALS — BP 133/74 | HR 75 | Temp 97.9°F | Resp 13 | Ht 67.0 in | Wt 183.0 lb

## 2024-03-19 DIAGNOSIS — D122 Benign neoplasm of ascending colon: Secondary | ICD-10-CM

## 2024-03-19 DIAGNOSIS — K648 Other hemorrhoids: Secondary | ICD-10-CM | POA: Diagnosis not present

## 2024-03-19 DIAGNOSIS — Z1211 Encounter for screening for malignant neoplasm of colon: Secondary | ICD-10-CM

## 2024-03-19 MED ORDER — SODIUM CHLORIDE 0.9 % IV SOLN: 500.0000 mL | Freq: Once | INTRAVENOUS | Status: DC

## 2024-03-19 MED ORDER — ATORVASTATIN CALCIUM 20 MG PO TABS: ORAL_TABLET | ORAL | 1 refills | Status: DC

## 2024-03-19 NOTE — Patient Instructions (Signed)
 Resume previous diet Continue present medications Await pathology results Repeat colonoscopy is recommended, date will be determined based on pathology results See handouts on polyps and hemorrhoids  YOU HAD AN ENDOSCOPIC PROCEDURE TODAY AT THE [facility]:   Refer to the procedure report that was given to you for any specific questions about what was found during the examination.  If the procedure report does not answer your questions, please call your gastroenterologist to clarify.  If you requested that your care partner not be given the details of your procedure findings, then the procedure report has been included in a sealed envelope for you to review at your convenience later.  YOU SHOULD EXPECT: Some feelings of bloating in the abdomen. Passage of more gas than usual.  Walking can help get rid of the air that was put into your GI tract during the procedure and reduce the bloating. If you had a lower endoscopy (such as a colonoscopy or flexible sigmoidoscopy) you may notice spotting of blood in your stool or on the toilet paper. If you underwent a bowel prep for your procedure, you may not have a normal bowel movement for a few days.  Please Note:  You might notice some irritation and congestion in your nose or some drainage.  This is from the oxygen used during your procedure.  There is no need for concern and it should clear up in a day or so.  SYMPTOMS TO REPORT IMMEDIATELY:  Following lower endoscopy (colonoscopy or flexible sigmoidoscopy):  Excessive amounts of blood in the stool  Significant tenderness or worsening of abdominal pains  Swelling of the abdomen that is new, acute  Fever of 100F or higher  For urgent or emergent issues, a gastroenterologist can be reached at any hour by calling [phone]. Do not use MyChart messaging for urgent concerns.    DIET:  We do recommend a small meal at first, but then you may proceed to your regular diet.  Drink plenty of  fluids but you should avoid alcoholic beverages for 24 hours.  ACTIVITY:  You should plan to take it easy for the rest of today and you should NOT DRIVE or use heavy machinery until tomorrow (because of the sedation medicines used during the test).    FOLLOW UP: Our staff will call the number listed on your records the next business day following your procedure.  We will call around 7:15- 8:00 am to check on you and address any questions or concerns that you may have regarding the information given to you following your procedure. If we do not reach you, we will leave a message.     If any biopsies were taken you will be contacted by phone or by letter within the next 1-3 weeks.  Please call us  at [phone] if you have not heard about the biopsies in 3 weeks.   SIGNATURES/CONFIDENTIALITY: You and/or your care partner have signed paperwork which will be entered into your electronic medical record.  These signatures attest to the fact that that the information above on your After Visit Summary has been reviewed and is understood.  Full responsibility of the confidentiality of this discharge information lies with you and/or your care-partner.

## 2024-03-19 NOTE — Progress Notes (Signed)
 Pt's states no medical or surgical changes since previsit or office visit.

## 2024-03-19 NOTE — Telephone Encounter (Signed)
 Copied from CRM [id number]. Topic: Clinical - Medication Refill >> Mar 19, 2024  3:09 PM Adonis Hoot wrote: Most Recent Primary Care Visit:  Provider: Sylvia Everts  Department: [facility]  Visit Type: PHYSICAL/AWV  Date: 01/12/2024  Medication: atorvastatin  (LIPITOR) 20 MG tablet  Has the patient contacted their pharmacy? No (Agent: If no, request that the patient contact the pharmacy for the refill. If patient does not wish to contact the pharmacy document the reason why and proceed with request.) (Agent: If yes, when and what did the pharmacy advise?)  Is this the correct pharmacy for this prescription? Yes If no, delete pharmacy and type the correct one.  This is the patient's preferred pharmacy:  [id number] [place], [place] - [phone] AT [facility] OF [facility] & [address] [place] [address] Phone: [phone] Fax: [phone]     Has the prescription been filled recently? No  Is the patient out of the medication? Yes  Has the patient been seen for an appointment in the last year OR does the patient have an upcoming appointment? Yes  Can we respond through MyChart? Yes  Agent: Please be advised that Rx refills may take up to 3 business days. We ask that you follow-up with your pharmacy.

## 2024-03-19 NOTE — Progress Notes (Signed)
 GASTROENTEROLOGY PROCEDURE H&P NOTE   Primary Care Physician: Sylvia Everts, PA-C    Reason for Procedure:   Screening colonoscopy  Plan:    colonoscopy  Patient is appropriate for endoscopic procedure(s) in the ambulatory ([facility]) setting.  The nature of the procedure, as well as the risks, benefits, and alternatives were carefully and thoroughly reviewed with the patient. Ample time for discussion and questions allowed. The patient understood, was satisfied, and agreed to proceed.     HPI: Craig House is a 63 y.o. male who presents for colonoscopy.  Medical history as below.  Tolerated the prep.  No recent chest pain or shortness of breath.  No abdominal pain today.  Eliquis  off since 1 month after knee replacement.  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 Authorizing Provider  albuterol  (VENTOLIN  HFA) 108 (90 Base) MCG/ACT inhaler INHALE 2 PUFFS BY MOUTH EVERY 6 HOURS AS NEEDED FOR WHEEZING OR SHORTNESS OF BREATH Patient taking differently: Inhale 3 puffs into the lungs as needed for wheezing or shortness of breath. 11/09/21  Yes Saguier, Gaylin Ke, PA-C  amLODipine  (NORVASC ) 10 MG tablet TAKE 1 TABLET BY MOUTH EVERY DAY 09/25/23  Yes Saguier, Gaylin Ke, PA-C  atorvastatin  (LIPITOR) 20 MG tablet  TAKE 1 TABLET BY MOUTH EVERY DAY 12/09/22  Yes Saguier, Gaylin Ke, PA-C  Cinnamon 500 MG TABS Take 500 mg by mouth daily.   Yes [provider]  metFORMIN  (GLUCOPHAGE ) 500 MG tablet TAKE 1 TABLET(500 MG) BY MOUTH TWICE DAILY WITH A MEAL 04/24/23  Yes Saguier, Gaylin Ke, PA-C  methocarbamol  (ROBAXIN -750) 750 MG tablet Take 1 tablet (750 mg total) by mouth 2 (two) times daily as needed for muscle spasms. To be taken after surgery 02/13/24  Yes Sandie Cross, PA-C  Multiple Vitamin (MULTI-VITAMINS) TABS Take 1 tablet by mouth daily.   Yes [provider]  triamterene -hydrochlorothiazide  (DYAZIDE ) 37.5-25 MG capsule Take 1 each (1 capsule total) by mouth every morning. 07/06/23  Yes Saguier, Gaylin Ke, PA-C  apixaban  (ELIQUIS ) 2.5 MG TABS tablet Take 1 tablet (2.5 mg total) by mouth 2 (two) times daily. To be taken after surgery to prevent blood clots Patient not taking: Reported on 03/19/2024 01/29/24   Sandie Cross, PA-C  Brimonidine Tartrate (LUMIFY) 0.025 % SOLN Place 1 drop into both eyes daily as needed (redness).    [provider]  Cholecalciferol (DIALYVITE VITAMIN D 5000) 125 MCG (5000 UT) capsule Take 5,000 Units by mouth every other day.    [provider]  docusate sodium  (COLACE) 100 MG capsule Take 1 capsule (100 mg total) by mouth daily as needed. 02/07/24 02/06/25  Sandie Cross, PA-C  doxycycline  (VIBRAMYCIN ) 100 MG capsule Take 1 capsule (100 mg  total) by mouth 2 (two) times daily. To be taken after surgery Patient not taking: Reported on 01/24/2024 01/17/24   Sandie Cross, PA-C  HYDROcodone -acetaminophen  (NORCO/VICODIN) 5-325 MG tablet Take 1-2 tablets by mouth 2 (two) times daily as needed for moderate pain (pain score 4-6). 03/12/24   Sandie Cross, PA-C  insulin  lispro (HUMALOG  KWIKPEN) 100 UNIT/ML KwikPen Inject 4 Units into the skin 3 (three) times daily. Patient taking differently: Inject 4 Units into the skin as needed (high bs). 05/05/23   Saguier,  Gaylin Ke, PA-C  Iron-Vitamin C 65-125 MG TABS Take 1 tablet by mouth daily.    [provider]  ondansetron  (ZOFRAN ) 4 MG tablet Take 1 tablet (4 mg total) by mouth every 8 (eight) hours as needed for nausea or vomiting. Patient not taking: Reported on 01/24/2024 01/02/23   Sandie Cross, PA-C  oxyCODONE -acetaminophen  (PERCOCET) 5-325 MG tablet Take 1-2 tablets by mouth every 8 (eight) hours as needed. To be taken after surgery Patient not taking: Reported on 03/19/2024 02/13/24   Sandie Cross, PA-C  Semaglutide ,0.25 or 0.5MG /DOS, (OZEMPIC , 0.25 OR 0.5 MG/DOSE,) 2 MG/3ML SOPN Inject 0.5 mg into the skin once a week. Patient not taking: Reported on 03/06/2024 01/13/24   Saguier, Gaylin Ke, PA-C  triamcinolone  cream (KENALOG ) 0.1 % Apply 1 Application topically 2 (two) times daily. Patient not taking: Reported on 03/06/2024 05/05/23   Saguier, Gaylin Ke, PA-C    Current Outpatient Medications  Medication Sig Dispense Refill   albuterol  (VENTOLIN  HFA) 108 (90 Base) MCG/ACT inhaler INHALE 2 PUFFS BY MOUTH EVERY 6 HOURS AS NEEDED FOR WHEEZING OR SHORTNESS OF BREATH (Patient taking differently: Inhale 3 puffs into the lungs as needed for wheezing or shortness of breath.) 8.5 g 0   amLODipine  (NORVASC ) 10 MG tablet TAKE 1 TABLET BY MOUTH EVERY DAY 90 tablet 1   atorvastatin  (LIPITOR) 20 MG tablet TAKE 1 TABLET BY MOUTH EVERY DAY 90 tablet 3   Cinnamon 500 MG TABS Take 500 mg by mouth daily.     metFORMIN  (GLUCOPHAGE ) 500 MG tablet TAKE 1 TABLET(500 MG) BY MOUTH TWICE DAILY WITH A MEAL 180 tablet 3   methocarbamol  (ROBAXIN -750) 750 MG tablet Take 1 tablet (750 mg total) by mouth 2 (two) times daily as needed for muscle spasms. To be taken after surgery 20 tablet 2   Multiple Vitamin (MULTI-VITAMINS) TABS Take 1 tablet by mouth daily.     triamterene -hydrochlorothiazide  (DYAZIDE ) 37.5-25 MG capsule Take 1 each (1 capsule total) by mouth every morning. 90 capsule 3   apixaban  (ELIQUIS ) 2.5 MG TABS tablet  Take 1 tablet (2.5 mg total) by mouth 2 (two) times daily. To be taken after surgery to prevent blood clots (Patient not taking: Reported on 03/19/2024) 60 tablet 0   Brimonidine Tartrate (LUMIFY) 0.025 % SOLN Place 1 drop into both eyes daily as needed (redness).     Cholecalciferol (DIALYVITE VITAMIN D 5000) 125 MCG (5000 UT) capsule Take 5,000 Units by mouth every other day.     docusate sodium  (COLACE) 100 MG capsule Take 1 capsule (100 mg total) by mouth daily as needed. 30 capsule 2   doxycycline  (VIBRAMYCIN ) 100 MG capsule Take 1 capsule (100 mg total) by mouth 2 (two) times daily. To be taken after surgery (Patient not taking: Reported on 01/24/2024) 20 capsule 0   HYDROcodone -acetaminophen  (NORCO/VICODIN) 5-325 MG tablet Take 1-2 tablets by mouth 2 (two) times daily as needed for moderate pain (pain score 4-6). 30 tablet 0   insulin   lispro (HUMALOG  KWIKPEN) 100 UNIT/ML KwikPen Inject 4 Units into the skin 3 (three) times daily. (Patient taking differently: Inject 4 Units into the skin as needed (high bs).) 15 mL 3   Iron-Vitamin C 65-125 MG TABS Take 1 tablet by mouth daily.     ondansetron  (ZOFRAN ) 4 MG tablet Take 1 tablet (4 mg total) by mouth every 8 (eight) hours as needed for nausea or vomiting. (Patient not taking: Reported on 01/24/2024) 40 tablet 0   oxyCODONE -acetaminophen  (PERCOCET) 5-325 MG tablet Take 1-2 tablets by mouth every 8 (eight) hours as needed. To be taken after surgery (Patient not taking: Reported on 03/19/2024) 40 tablet 0   Semaglutide ,0.25 or 0.5MG /DOS, (OZEMPIC , 0.25 OR 0.5 MG/DOSE,) 2 MG/3ML SOPN Inject 0.5 mg into the skin once a week. (Patient not taking: Reported on 03/06/2024) 3 mL 3   triamcinolone  cream (KENALOG ) 0.1 % Apply 1 Application topically 2 (two) times daily. (Patient not taking: Reported on 03/06/2024) 30 g 0   Current Facility-Administered Medications  Medication Dose Route Frequency Provider Last Rate Last Admin   0.9 %  sodium chloride  infusion  500  mL Intravenous Once Rodric Punch, Amber Bail, MD        Allergies as of 03/19/2024   (No Known Allergies)    Family History  Problem Relation Age of Onset   Heart attack Mother    Cancer Father    Heart disease Father    Colon cancer Neg Hx    Colon polyps Neg Hx    Esophageal cancer Neg Hx    Prostate cancer Neg Hx    Rectal cancer Neg Hx     Social History   Socioeconomic History   Marital status: Single    Spouse name: Not on file   Number of children: Not on file   Years of education: Not on file   Highest education level: Some college, no degree  Occupational History   Not on file  Tobacco Use   Smoking status: Former   Smokeless tobacco: Never  Vaping Use   Vaping status: Never Used  Substance and Sexual Activity   Alcohol use: Yes    Alcohol/week: 1.0 standard drink of alcohol    Types: 1 Standard drinks or equivalent per week    Comment: 1 glass of red wine at night   Drug use: No   Sexual activity: Yes  Other Topics Concern   Not on file  Social History Narrative   Not on file   Social Drivers of Health   Financial Resource Strain: Low Risk  (01/12/2024)   Overall Financial Resource Strain (CARDIA)    Difficulty of Paying Living Expenses: Not very hard  Food Insecurity: No Food Insecurity (01/12/2024)   Hunger Vital Sign    Worried About Running Out of Food in the Last Year: Never true    Ran Out of Food in the Last Year: Never true  Transportation Needs: No Transportation Needs (01/12/2024)   PRAPARE - Administrator, Civil Service (Medical): No    Lack of Transportation (Non-Medical): No  Physical Activity: Sufficiently Active (01/12/2024)   Exercise Vital Sign    Days of Exercise per Week: 3 days    Minutes of Exercise per Session: 60 min  Stress: No Stress Concern Present (01/12/2024)   [employer] of Occupational Health - Occupational Stress Questionnaire    Feeling of Stress : Only a little  Social Connections: Moderately Integrated  (01/12/2024)   Social Connection and Isolation Panel [  NHANES]    Frequency of Communication with Friends and Family: More than three times a week    Frequency of Social Gatherings with Friends and Family: Twice a week    Attends Religious Services: More than 4 times per year    Active Member of [employer] or Organizations: No    Attends Engineer, structural: More than 4 times per year    Marital Status: Divorced  Catering manager Violence: Not At Risk (01/09/2023)   Humiliation, Afraid, Rape, and Kick questionnaire    Fear of Current or Ex-Partner: No    Emotionally Abused: No    Physically Abused: No    Sexually Abused: No    Physical Exam: Vital signs in last 24 hours: @BP  125/70   Pulse 87   Temp 97.9 F (36.6 C)   Ht 5\' 7"  (1.702 m)   Wt 183 lb (83 kg)   SpO2 97%   BMI 28.66 kg/m  GEN: NAD EYE: Sclerae anicteric ENT: MMM CV: Non-tachycardic Pulm: CTA b/l GI: Soft, NT/ND NEURO:  Alert & Oriented x 3   Laurell Pond, MD [facility]  03/19/2024 8:32 AM

## 2024-03-19 NOTE — Telephone Encounter (Signed)
 Last Fill: 12/09/22  Last OV: 01/12/24 Next OV: None Scheduled  Routing to provider for review/authorization.

## 2024-03-19 NOTE — Progress Notes (Signed)
 Vss nad trans to pacu

## 2024-03-19 NOTE — Op Note (Signed)
 [facility] Patient Name: Craig House Procedure Date: 03/19/2024 8:28 AM MRN: [id number] Endoscopist: Nannette Babe , MD, [id number] Age: 63 Referring MD:  Date of Birth: [birth year] Gender: Male Account #: [id number] Procedure:                Colonoscopy Indications:              Screening for colorectal malignant neoplasm, Last                            colonoscopy 10 years ago Medicines:                Monitored Anesthesia Care Procedure:                Pre-Anesthesia Assessment:                           - Prior to the procedure, a History and Physical                            was performed, and patient medications and                            allergies were reviewed. The patient's tolerance of                            previous anesthesia was also reviewed. The risks                            and benefits of the procedure and the sedation                            options and risks were discussed with the patient.                            All questions were answered, and informed consent                            was obtained. Prior Anticoagulants: The patient has                            taken no anticoagulant or antiplatelet agents. ASA                            Grade Assessment: III - A patient with severe                            systemic disease. After reviewing the risks and                            benefits, the patient was deemed in satisfactory                            condition to undergo the procedure.  After obtaining informed consent, the colonoscope                            was passed under direct vision. Throughout the                            procedure, the patient's blood pressure, pulse, and                            oxygen saturations were monitored continuously. The                            CF [id number] #[id number] was introduced through the anus                            and advanced to the cecum,  identified by                            appendiceal orifice and ileocecal valve. The                            colonoscopy was performed without difficulty. The                            patient tolerated the procedure well. The quality                            of the bowel preparation was good. The ileocecal                            valve, appendiceal orifice, and rectum were                            photographed. Scope In: 8:45:13 AM Scope Out: 8:59:05 AM Scope Withdrawal Time: 0 hours 11 minutes 38 seconds  Total Procedure Duration: 0 hours 13 minutes 52 seconds  Findings:                 The digital rectal exam was normal.                           A 5 mm polyp was found in the ascending colon. The                            polyp was sessile. The polyp was removed with a                            cold snare. Resection and retrieval were complete.                           Internal hemorrhoids were found during                            retroflexion. The hemorrhoids were small.  The exam was otherwise without abnormality. Complications:            No immediate complications. Estimated Blood Loss:     Estimated blood loss: none. Impression:               - One 5 mm polyp in the ascending colon, removed                            with a cold snare. Resected and retrieved.                           - Small internal hemorrhoids.                           - The examination was otherwise normal. Recommendation:           - Patient has a contact number available for                            emergencies. The signs and symptoms of potential                            delayed complications were discussed with the                            patient. Return to normal activities tomorrow.                            Written discharge instructions were provided to the                            patient.                           - Resume previous diet.                            - Continue present medications.                           - Await pathology results.                           - Repeat colonoscopy is recommended. The                            colonoscopy date will be determined after pathology                            results from today's exam become available for                            review. Nannette Babe, MD 03/19/2024 9:02:00 AM This report has been signed electronically.

## 2024-03-19 NOTE — Progress Notes (Signed)
 Called to room to assist during endoscopic procedure.  Patient ID and intended procedure confirmed with present staff. Received instructions for my participation in the procedure from the performing physician.

## 2024-03-20 ENCOUNTER — Telehealth: Payer: Self-pay

## 2024-03-20 NOTE — Telephone Encounter (Signed)
 Attempted to reach patient for follow up phone call. No answer, left voicemail to call Dr. Marietta Shorter office with any questions or concerns.

## 2024-03-21 ENCOUNTER — Ambulatory Visit: Admitting: Rehabilitative and Restorative Service Providers"

## 2024-03-21 ENCOUNTER — Encounter: Payer: Self-pay | Admitting: Rehabilitative and Restorative Service Providers"

## 2024-03-21 DIAGNOSIS — M6281 Muscle weakness (generalized): Secondary | ICD-10-CM | POA: Diagnosis not present

## 2024-03-21 DIAGNOSIS — R262 Difficulty in walking, not elsewhere classified: Secondary | ICD-10-CM | POA: Diagnosis not present

## 2024-03-21 DIAGNOSIS — M25661 Stiffness of right knee, not elsewhere classified: Secondary | ICD-10-CM | POA: Diagnosis not present

## 2024-03-21 DIAGNOSIS — R600 Localized edema: Secondary | ICD-10-CM

## 2024-03-21 DIAGNOSIS — M25561 Pain in right knee: Secondary | ICD-10-CM

## 2024-03-21 DIAGNOSIS — G8929 Other chronic pain: Secondary | ICD-10-CM

## 2024-03-21 LAB — SURGICAL PATHOLOGY

## 2024-03-21 NOTE — Therapy (Signed)
 OUTPATIENT PHYSICAL THERAPY TREATMENT   Patient Name: Craig House MRN: [id number] DOB:July 29, 1961, 63 y.o., male Today's Date: 03/21/2024  END OF SESSION:  PT End of Session - 03/21/24 1519     Visit Number 9    Number of Visits 20    Date for PT Re-Evaluation 04/30/24    Authorization Type [employer]    Progress Note Due on Visit 10    PT Start Time 1514    PT Stop Time 1600    PT Time Calculation (min) 46 min    Activity Tolerance Patient tolerated treatment well    Behavior During Therapy [facility] for tasks assessed/performed                     Past Medical History:  Diagnosis Date   Allergy    spring   Anemia    Arthritis    Asthma    Diabetes mellitus without complication (HCC)    type II   Hyperplastic colon polyp    Hypertension    Obesity    Past Surgical History:  Procedure Laterality Date   KNEE ARTHROSCOPY Bilateral    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   Procedure: LEFT TOTAL KNEE ARTHROPLASTY;  Surgeon: Wes Hamman, MD;  Location: [facility] OR;  Service: Orthopedics;  Laterality: Left;   TOTAL KNEE ARTHROPLASTY Right 01/29/2024   Procedure: RIGHT TOTAL KNEE ARTHROPLASTY;  Surgeon: Wes Hamman, MD;  Location: [facility] OR;  Service: Orthopedics;  Laterality: Right;   Patient Active Problem List   Diagnosis Date Noted   Status post total right knee replacement 01/29/2024   Primary osteoarthritis of right knee 12/26/2023   Status post total left knee replacement 01/09/2023   Bilateral primary osteoarthritis of knee 06/19/2018   Mild intermittent asthma, uncomplicated 01/18/2016   HTN (hypertension) 12/31/2011    PCP: Sylvia Everts, PA - C  REFERRING PROVIDER: Sandie Cross, PA-C  REFERRING DIAG: [phone] (ICD-10-CM) - Status post total right knee replacement  THERAPY DIAG:  Chronic pain of right knee  Stiffness of right knee, not elsewhere classified  Muscle weakness (generalized)  Difficulty in walking, not elsewhere  classified  Localized edema  Rationale for Evaluation and Treatment: Rehabilitation  ONSET DATE: 01/29/2024 surgery  SUBJECTIVE:   SUBJECTIVE STATEMENT: Reported stiffness chief complaint.  Pt indicated some trouble with sleeping at night.  Trying to walk more without cane.  Working on exercise at home.   PERTINENT HISTORY: PMH includes anemia, asthma, DM, HTN and Lt TKA 2024.  PAIN:  NPRS scale: at worst 4/10 in last few days.  Pain location: Rt knee  Pain description: sharp pain, ache/tightness.  Aggravating factors: static positioning, end ranges, WB pressure.  Relieving factors: medicine, ice  PRECAUTIONS: None  WEIGHT BEARING RESTRICTIONS: No  FALLS:  Has patient fallen in last 6 months? 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  Waist to waist lifting.   PLOF: Independent, walking for exercise, previous bike riding.  [employer]  PATIENT GOALS: Reduce pain, get back to work and activity.   OBJECTIVE:   PATIENT SURVEYS:  Patient-Specific Activity Scoring Scheme  "0" represents "unable to perform." "10" represents "able to perform at prior level. 0 1 2 3 4 5 6 7 8 9  10 (Date and Score)   Activity  Eval  02/20/2024 03/12/24   1. Stairs  2  3   2. standing 4 6   3. showering 4 4  4.  Prolonged static positioning (sleeping, sitting) 2 3  5.    Score 3 avg 4   Total score = sum of the activity scores/number of activities Minimum detectable change (90%CI) for average score = 2 points Minimum detectable change (90%CI) for single activity score = 3 points  COGNITION: 02/20/2024 Overall cognitive status: WFL    SENSATION: 02/20/2024 Not tested  EDEMA:  02/20/2024 Localized edema noted in Rt knee/lower leg   MUSCLE LENGTH: 02/20/2024 No  specific testing  POSTURE:  02/20/2024 Nothing specific noted impacting Rt knee other than weight shift towards Lt in standing.   PALPATION: 02/20/2024 Mild tenderness anterior knee joint, incision area.   LOWER EXTREMITY ROM:   ROM Right Eval 02/20/2024 Right 02/29/24 Right 03/06/24 Rt 03/12/24  Hip flexion      Hip extension      Hip abduction      Hip adduction      Hip internal rotation      Hip external rotation      Knee flexion 109 AROM in supine heel slide  106 in supine AROM heel slide Seated P: 113* A: 107* Seated A: 108 P: 112 Supine:  A:   Knee extension -31 AROM in seated LAQ  Supine heel prop: -11  -6 in supine quad set LAQ -7* Supine: A: -7 P; -5   Ankle dorsiflexion      Ankle plantarflexion      Ankle inversion      Ankle eversion       (Blank rows = not tested)  LOWER EXTREMITY MMT:  MMT Right Eval 02/20/2024 Left Eval 02/20/2024 Right 03/21/2024 Left 03/21/2024  Hip flexion 5/5 5/5    Hip extension      Hip abduction      Hip adduction      Hip internal rotation      Hip external rotation      Knee flexion 4/5 5/5    Knee extension 3+/5 5/5 4/5 27.1, 25 lbs 5/5 60, 65.8 lbs  Ankle dorsiflexion 5/5 5/5    Ankle plantarflexion      Ankle inversion      Ankle eversion       (Blank rows = not tested)  LOWER EXTREMITY SPECIAL TESTS:  02/20/2024 No specific testing today  FUNCTIONAL TESTS:  02/20/2024 18 inch chair transfer: able to perform without UE assist after several tries with deviation to Lt LE Lt SLS: 10 seconds Rt SLS: unable unassisted.   GAIT: 02/20/2024 Distance walked: household distances  Assistive device utilized: SPC use in Lt UE Level of assistance: Modified independence Comments: reduced stance on Rt leg, maintained knee flexion  TODAY'S TREATMENT                                                                           DATE: 03/21/2024 Therex: Recumbent bike 3 8 mins , seat 7 Incline gastroc stretch 30 sec x 3 bilateral  Extension TKE stretch at start of vaso to tolerance  Neuro Re-ed (balance control, coordination) Tandem stance on foam in // bars with minimal HHA 1 min x 1 bilateral  SLS with corner touching contralateral leg on black mat x 6 each, peformed bilaterally with occasional HHA on bar  TherActivity (improve stairs, transfers, squatting) Leg press double leg 100 lbs x 20 slow lowering focus, single leg Rt 56 lbs 2 x 15  Step on over and down 4 inch step WB on Rt leg no UE assist x 10   Vasopneumatic device 10 mins Rt knee medium compression   TODAY'S TREATMENT                                                                          DATE: 03/14/2024 Therapeutic Exercise: scifit bike lvl 3.0 seat 10 for ROM/strengthening - 10 mins  Calf stretch: slant board x 3 holding 30 sec Therapeutic Activities: (strength Sit to stand: x 10  Step up and down 6 inch step x 15 (up with right down with left) Leg press DL 161# 0R60, then Right leg only 56# 2X10 Side stepping x 3 in parallel bars without UE suppport Walking forwad/backward in //bars without UE support X 3 trips Seated LAQ 3# 2X10 Manual Therapy: Grade 3-4 mobs with traction & tibial IR for knee flexion range Vaso 10 mins Rt knee medium compression in elevation ext stretch 34 deg.    TODAY'S TREATMENT                                                                          DATE: 03/12/2024 Therapeutic Exercise: scifit bike lvl 3.0 seat 10 for ROM/strengthening - 10 mins  Calf stretch: slant board x 2 holding 30 sec Therapeutic Activities: Sit to stand: x 10  Step up and down 6 inch step x 15 (up with right down with left) Step down on 4 inch step c heel taps x 10  Side stepping x 6 in parallel bars Manual Therapy: Grade 3-4 mobs with traction & tibial IR for knee flexion  range Vaso 10 mins Rt knee medium compression in elevation ext stretch 34 deg.     TODAY'S TREATMENT  DATE: 03/06/2024 Therapeutic Exercise: Recumbent bike lvl 1.0 seat 7 for ROM - 8 mins  Seated LAQ with end range ankle DF & contralateral knee flexion to facilitate stronger quad contraction 10 reps 2 sets  Therapeutic Activities: Tandem stance 30 sec RLE in front & in back; 1st rep floor eyes open, 2nd rep floor eyes closed, 3rd rep foam eyes open.  At sink for HEP. Pt verbalized understanding.  PT demo & verbal cues on sit to/from stand technique. Pt performed 10 reps with PT cues to improve form.  Stairs: PT demo & verbal cues on technique. 11 steps step-to pattern using RLE 1st rep 2 rails, 2nd rep single rail (8 steps left rail, 3 steps right rail) and cane.    Manual Therapy: Grade IV mob with traction & tibial int rot for knee flexion range  Vaso 10 mins Rt knee medium compression in elevation ext stretch 34 deg.    PATIENT EDUCATION:  02/20/2024 Education details: HEP, POC Person educated: Patient Education method: Programmer, multimedia, Demonstration, Verbal cues, and Handouts Education comprehension: verbalized understanding, returned demonstration, and verbal cues required  HOME EXERCISE PROGRAM: Access Code: [email or web address] URL: [email or web address] Date: 03/05/2024 Prepared by: Lorie Rook  Exercises - Supine Heel Slide  - 2-3 x daily - 7 x weekly - 1 sets - 10 reps - 5 hold - Supine Quadricep Sets  - 2-3 x daily - 7 x weekly - 1 sets - 10 reps - 5 hold - Seated Long Arc Quad  - 2-3 x daily - 7 x weekly - 1 sets - 5-10 reps - 2 hold - Supine Knee Extension Mobilization with Weight  - 2-3 x daily - 7 x weekly - 1 sets - 1 reps - to tolerance up to 5 mins hold - Seated Knee Extension Stretch with Chair  - 2-3 x daily - 7 x weekly - 1 sets - 1 reps - 3-5 mins hold - Seated Quad Set  - 2-3 x daily  - 7 x weekly - 1 sets - 10 reps - 5 hold - Seated Calf Stretch with Strap  - 2-3 x daily - 7 x weekly - 1 sets - 3-5 reps - 30 hold - Hooklying Hamstring Stretch with Strap  - 1-3 x daily - 7 x weekly - 1 sets - 3 reps - 30 seconds hold - Gastroc Stretch on Step  - 1-3 x daily - 7 x weekly - 1 sets - 3 reps - 30 seconds hold - Supine Quadriceps Stretch with Strap on Table  - 1-3 x daily - 7 x weekly - 1 sets - 3 reps - 30 seconds hold - Tandem Stance  - 1 x daily - 7 x weekly - 3 sets - 2 reps - 30 seconds hold  ASSESSMENT:  CLINICAL IMPRESSION: Strength testing with dynamometry showed deficit on Rt vs Lt for knee extension which matches limits in WB control.  Ambulation independently in clinic with mild deviation in gait cycle noted in WB stance on Rt with lacking TKE.  Continued skilled PT services indicated to promote strengthening and extension gains.   OBJECTIVE IMPAIRMENTS: Abnormal gait, decreased activity tolerance, decreased balance, decreased coordination, decreased endurance, decreased mobility, difficulty walking, decreased ROM, decreased strength, hypomobility, increased edema, increased fascial restrictions, impaired perceived functional ability, increased muscle spasms, impaired flexibility, improper body mechanics, and pain.   ACTIVITY LIMITATIONS: lifting, bending, sitting, standing, squatting, sleeping, stairs, transfers, bed mobility, and locomotion level  PARTICIPATION LIMITATIONS: meal prep, cleaning, laundry, interpersonal relationship,  driving, shopping, community activity, and occupation  PERSONAL FACTORS:  PMH includes anemia, asthma, DM, HTN and Lt TKA 2024. are also affecting patient's functional outcome.   REHAB POTENTIAL: Good  CLINICAL DECISION MAKING: Evolving/moderate complexity  EVALUATION COMPLEXITY: Moderate   GOALS: Goals reviewed with patient? Yes  SHORT TERM GOALS: (target date for Short term goals are 3 weeks 03/12/2024)   1.  Patient will  demonstrate independent use of home exercise program to maintain progress from in clinic treatments.  Goal status: Met  LONG TERM GOALS: (target dates for all long term goals are 10 weeks  04/30/2024 )   1. Patient will demonstrate/report pain at worst less than or equal to 2/10 to facilitate minimal limitation in daily activity secondary to pain symptoms.  Goal status: Ongoing   03/12/2024   2. Patient will demonstrate independent use of home exercise program to facilitate ability to maintain/progress functional gains from skilled physical therapy services.  Goal status: Ongoing   4/292025   3. Patient will demonstrate Patient specific functional scale avg > or = 8/10 to indicate reduced disability due to condition.   Goal status:  Ongoing   03/12/2024   4.  Patient will demonstrate Rt LE MMT 5/5 throughout to faciltiate usual transfers, stairs, squatting at [facility] for daily life.   Goal status: Ongoing   03/12/2024   5.  Patient will demonstrate Rt knee AROM 0-110 deg to facilitate transfers and other daily mobility at PLOF.  Goal status: New   6.  Patient will demonstrate independent ambulation > 500 ft s deviation for community integration. Goal status: Ongoing   03/12/2024 (using straight cane)   7.  Patient will demonstrate ascending/descending stairs reciprocally s UE assist for community integration.   Goal Status: Ongoing   03/12/2024   PLAN:  PT FREQUENCY: 1-2x/week  PT DURATION: 10 weeks  PLANNED INTERVENTIONS: Can include 16109- PT Re-evaluation, 97110-Therapeutic exercises, 97530- Therapeutic activity, 97112- Neuromuscular re-education, 97535- Self Care, 97140- Manual therapy, [phone]- Gait training, [phone]- Orthotic Fit/training, [phone]- Canalith repositioning, [id number]- Aquatic Therapy, [phone]- Electrical stimulation (unattended), [phone]- Electrical stimulation (manual), [id number] Physical performance testing, 97016- Vasopneumatic device, [id number]- Ultrasound, [id number]- Traction  (mechanical), [id number]- Ionotophoresis 4mg /ml Dexamethasone , Patient/Family education, Balance training, Stair training, Taping, Dry Needling, Joint mobilization, Joint manipulation, Spinal manipulation, Spinal mobilization, Scar mobilization, Vestibular training, Visual/preceptual remediation/compensation, DME instructions, Cryotherapy, and Moist heat.  All performed as medically necessary.  All included unless contraindicated  PLAN FOR NEXT SESSION: WB strengthening, balance improvement.   Bonna Bustard, PT, DPT, OCS, ATC 03/21/24  3:56 PM

## 2024-03-22 ENCOUNTER — Ambulatory Visit (INDEPENDENT_AMBULATORY_CARE_PROVIDER_SITE_OTHER): Admitting: Rehabilitative and Restorative Service Providers"

## 2024-03-22 ENCOUNTER — Encounter: Payer: Self-pay | Admitting: Rehabilitative and Restorative Service Providers"

## 2024-03-22 DIAGNOSIS — M25561 Pain in right knee: Secondary | ICD-10-CM

## 2024-03-22 DIAGNOSIS — M25661 Stiffness of right knee, not elsewhere classified: Secondary | ICD-10-CM | POA: Diagnosis not present

## 2024-03-22 DIAGNOSIS — R262 Difficulty in walking, not elsewhere classified: Secondary | ICD-10-CM | POA: Diagnosis not present

## 2024-03-22 DIAGNOSIS — R600 Localized edema: Secondary | ICD-10-CM

## 2024-03-22 DIAGNOSIS — G8929 Other chronic pain: Secondary | ICD-10-CM

## 2024-03-22 DIAGNOSIS — M6281 Muscle weakness (generalized): Secondary | ICD-10-CM

## 2024-03-22 NOTE — Therapy (Signed)
 OUTPATIENT PHYSICAL THERAPY TREATMENT   Patient Name: Craig House MRN: [id number] DOB:[birth year], 63 y.o., male Today's Date: 03/22/2024  END OF SESSION:  PT End of Session - 03/22/24 1027     Visit Number 10    Number of Visits 20    Date for PT Re-Evaluation 04/30/24    Authorization Type [employer]    Progress Note Due on Visit --    PT Start Time 1009    PT Stop Time 1059    PT Time Calculation (min) 50 min    Activity Tolerance Patient tolerated treatment well    Behavior During Therapy [facility] for tasks assessed/performed                      Past Medical History:  Diagnosis Date   Allergy    spring   Anemia    Arthritis    Asthma    Diabetes mellitus without complication (HCC)    type II   Hyperplastic colon polyp    Hypertension    Obesity    Past Surgical History:  Procedure Laterality Date   KNEE ARTHROSCOPY Bilateral    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   Procedure: LEFT TOTAL KNEE ARTHROPLASTY;  Surgeon: Wes Hamman, MD;  Location: [facility] OR;  Service: Orthopedics;  Laterality: Left;   TOTAL KNEE ARTHROPLASTY Right 01/29/2024   Procedure: RIGHT TOTAL KNEE ARTHROPLASTY;  Surgeon: Wes Hamman, MD;  Location: [facility] OR;  Service: Orthopedics;  Laterality: Right;   Patient Active Problem List   Diagnosis Date Noted   Status post total right knee replacement 01/29/2024   Primary osteoarthritis of right knee 12/26/2023   Status post total left knee replacement 01/09/2023   Bilateral primary osteoarthritis of knee 06/19/2018   Mild intermittent asthma, uncomplicated 01/18/2016   HTN (hypertension) 12/31/2011    PCP: Sylvia Everts, PA - C  REFERRING PROVIDER: Sandie Cross, PA-C  REFERRING DIAG: [phone] (ICD-10-CM) - Status post total right knee replacement  THERAPY DIAG:  Chronic pain of right knee  Stiffness of right knee, not elsewhere classified  Muscle weakness (generalized)  Difficulty in walking, not elsewhere  classified  Localized edema  Rationale for Evaluation and Treatment: Rehabilitation  ONSET DATE: 01/29/2024 surgery  SUBJECTIVE:   SUBJECTIVE STATEMENT: No specific complaints today. Felt fine after yesterdays.   PERTINENT HISTORY: PMH includes anemia, asthma, DM, HTN and Lt TKA 2024.  PAIN:  NPRS scale: at worst 4/10 in last few days.  Pain location: Rt knee  Pain description: sharp pain, ache/tightness.  Aggravating factors: static positioning, end ranges, WB pressure.  Relieving factors: medicine, ice  PRECAUTIONS: None  WEIGHT BEARING RESTRICTIONS: No  FALLS:  Has patient fallen in last 6 months? No  LIVING ENVIRONMENT: Lives with: girlfriend Lives in: House/apartment Stairs: flight to bedroom, stairs to enter house with handrail Has following equipment at home: [facility], FWW  OCCUPATION: [employer].  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  Waist to waist lifting.   PLOF: Independent, walking for exercise, previous bike riding.  [employer]  PATIENT GOALS: Reduce pain, get back to work and activity.   OBJECTIVE:   PATIENT SURVEYS:  Patient-Specific Activity Scoring Scheme  "0" represents "unable to perform." "10" represents "able to perform at prior level. 0 1 2 3 4 5 6 7 8 9  10 (Date and Score)   Activity Eval  02/20/2024 03/12/24   1. Stairs  2  3   2. standing 4  6   3. showering 4 4  4.  Prolonged static positioning (sleeping, sitting) 2 3  5.    Score 3 avg 4   Total score = sum of the activity scores/number of activities Minimum detectable change (90%CI) for average score = 2 points Minimum detectable change (90%CI) for single activity score = 3 points  COGNITION: 02/20/2024 Overall cognitive status: WFL    SENSATION: 02/20/2024 Not tested  EDEMA:  02/20/2024 Localized edema noted in Rt knee/lower leg   MUSCLE LENGTH: 02/20/2024 No specific testing  POSTURE:  02/20/2024 Nothing specific noted impacting Rt knee other than  weight shift towards Lt in standing.   PALPATION: 02/20/2024 Mild tenderness anterior knee joint, incision area.   LOWER EXTREMITY ROM:   ROM Right Eval 02/20/2024 Right 02/29/24 Right 03/06/24 Rt 03/12/24  Hip flexion      Hip extension      Hip abduction      Hip adduction      Hip internal rotation      Hip external rotation      Knee flexion 109 AROM in supine heel slide  106 in supine AROM heel slide Seated P: 113* A: 107* Seated A: 108 P: 112 Supine:  A:   Knee extension -31 AROM in seated LAQ  Supine heel prop: -11  -6 in supine quad set LAQ -7* Supine: A: -7 P; -5   Ankle dorsiflexion      Ankle plantarflexion      Ankle inversion      Ankle eversion       (Blank rows = not tested)  LOWER EXTREMITY MMT:  MMT Right Eval 02/20/2024 Left Eval 02/20/2024 Right 03/21/2024 Left 03/21/2024  Hip flexion 5/5 5/5    Hip extension      Hip abduction      Hip adduction      Hip internal rotation      Hip external rotation      Knee flexion 4/5 5/5    Knee extension 3+/5 5/5 4/5 27.1, 25 lbs 5/5 60, 65.8 lbs  Ankle dorsiflexion 5/5 5/5    Ankle plantarflexion      Ankle inversion      Ankle eversion       (Blank rows = not tested)  LOWER EXTREMITY SPECIAL TESTS:  02/20/2024 No specific testing today  FUNCTIONAL TESTS:  02/20/2024 18 inch chair transfer: able to perform without UE assist after several tries with deviation to Lt LE Lt SLS: 10 seconds Rt SLS: unable unassisted.   GAIT: 02/20/2024 Distance walked: household distances  Assistive device utilized: SPC use in Lt UE Level of assistance: Modified independence Comments: reduced stance on Rt leg, maintained knee flexion  TODAY'S TREATMENT                                                                          DATE:  03/22/2024 Therex: Recumbent bike 3 8 mins , seat 7 Incline gastroc stretch 30 sec x 3 bilateral  Machine leg extension double leg up, single leg lowering Rt 5 lbs 2 x 10  Machine Rt leg curl 10 lbs 2 x 10  Extension TKE stretch at start of vaso to tolerance 5 mins  Neuro Re-ed (balance control, coordination) SLS with contralateral leg step over touch and back 6 inch hurdle 2 x 10 bilateral  Lateral step over 6 inch hurdle both feet x 15 each way  TherActivity (improve stairs, transfers, squatting) Leg press double leg 100 lbs x 20 slow lowering focus, single leg Rt 56 lbs 2 x 15  Lateral step down 6 inch step WB on Rt leg c focus on slow lowering 2 x 10   Vasopneumatic device 10 mins Rt knee medium compression   TODAY'S TREATMENT                                                                          DATE: 03/21/2024 Therex: Recumbent bike 3 8 mins , seat 7 Incline gastroc stretch 30 sec x 3 bilateral  Extension TKE stretch at start of vaso to tolerance  Neuro Re-ed (balance control, coordination) Tandem stance on foam in // bars with minimal HHA 1 min x 1 bilateral  SLS with corner touching contralateral leg on black mat x 6 each, peformed bilaterally with occasional HHA on bar  TherActivity (improve stairs, transfers, squatting) Leg press double leg 100 lbs x 20 slow lowering focus, single leg Rt 56 lbs 2 x 15  Step on over and down 4 inch step WB on Rt leg no UE assist x 10   Vasopneumatic device 10 mins Rt knee medium compression   TODAY'S TREATMENT                                                                          DATE: 03/14/2024 Therapeutic Exercise: scifit bike lvl 3.0 seat 10 for ROM/strengthening - 10 mins  Calf stretch: slant board x 3 holding 30 sec Therapeutic Activities: (strength Sit to stand: x 10  Step up and down 6 inch step x 15 (up with right down with left) Leg press DL 098# 1X91, then Right leg only 56# 2X10 Side stepping x 3 in parallel bars  without UE suppport Walking forwad/backward in //bars without UE support X 3 trips Seated LAQ 3# 2X10 Manual Therapy: Grade 3-4 mobs with traction & tibial IR for knee flexion range Vaso 10  mins Rt knee medium compression in elevation ext stretch 34 deg.    TODAY'S TREATMENT                                                                          DATE: 03/12/2024 Therapeutic Exercise: scifit bike lvl 3.0 seat 10 for ROM/strengthening - 10 mins  Calf stretch: slant board x 2 holding 30 sec Therapeutic Activities: Sit to stand: x 10  Step up and down 6 inch step x 15 (up with right down with left) Step down on 4 inch step c heel taps x 10  Side stepping x 6 in parallel bars Manual Therapy: Grade 3-4 mobs with traction & tibial IR for knee flexion range Vaso 10 mins Rt knee medium compression in elevation ext stretch 34 deg.    PATIENT EDUCATION:  02/20/2024 Education details: HEP, POC Person educated: Patient Education method: Programmer, multimedia, Demonstration, Verbal cues, and Handouts Education comprehension: verbalized understanding, returned demonstration, and verbal cues required  HOME EXERCISE PROGRAM: Access Code: [email or web address] URL: [email or web address] Date: 03/05/2024 Prepared by: Lorie Rook  Exercises - Supine Heel Slide  - 2-3 x daily - 7 x weekly - 1 sets - 10 reps - 5 hold - Supine Quadricep Sets  - 2-3 x daily - 7 x weekly - 1 sets - 10 reps - 5 hold - Seated Long Arc Quad  - 2-3 x daily - 7 x weekly - 1 sets - 5-10 reps - 2 hold - Supine Knee Extension Mobilization with Weight  - 2-3 x daily - 7 x weekly - 1 sets - 1 reps - to tolerance up to 5 mins hold - Seated Knee Extension Stretch with Chair  - 2-3 x daily - 7 x weekly - 1 sets - 1 reps - 3-5 mins hold - Seated Quad Set  - 2-3 x daily - 7 x weekly - 1 sets - 10 reps - 5 hold - Seated Calf Stretch with Strap  - 2-3 x daily - 7 x weekly - 1 sets - 3-5 reps - 30 hold - Hooklying Hamstring Stretch with  Strap  - 1-3 x daily - 7 x weekly - 1 sets - 3 reps - 30 seconds hold - Gastroc Stretch on Step  - 1-3 x daily - 7 x weekly - 1 sets - 3 reps - 30 seconds hold - Supine Quadriceps Stretch with Strap on Table  - 1-3 x daily - 7 x weekly - 1 sets - 3 reps - 30 seconds hold - Tandem Stance  - 1 x daily - 7 x weekly - 3 sets - 2 reps - 30 seconds hold  ASSESSMENT:  CLINICAL IMPRESSION: Continued plan for progressive strengthening and balance improvements to improve stability in ambulation and other WB activity.   Making good overall gains though in quality of movements within the clinic.   OBJECTIVE IMPAIRMENTS: Abnormal gait, decreased activity tolerance, decreased balance, decreased coordination, decreased endurance, decreased mobility, difficulty walking, decreased ROM, decreased strength, hypomobility, increased edema, increased fascial restrictions, impaired perceived functional ability, increased muscle spasms, impaired flexibility, improper body mechanics, and pain.   ACTIVITY LIMITATIONS: lifting, bending, sitting, standing, squatting, sleeping, stairs, transfers, bed mobility, and locomotion level  PARTICIPATION LIMITATIONS: meal  prep, cleaning, laundry, interpersonal relationship, driving, shopping, community activity, and occupation  PERSONAL FACTORS:  PMH includes anemia, asthma, DM, HTN and Lt TKA 2024. are also affecting patient's functional outcome.   REHAB POTENTIAL: Good  CLINICAL DECISION MAKING: Evolving/moderate complexity  EVALUATION COMPLEXITY: Moderate   GOALS: Goals reviewed with patient? Yes  SHORT TERM GOALS: (target date for Short term goals are 3 weeks 03/12/2024)   1.  Patient will demonstrate independent use of home exercise program to maintain progress from in clinic treatments.  Goal status: Met  LONG TERM GOALS: (target dates for all long term goals are 10 weeks  04/30/2024 )   1. Patient will demonstrate/report pain at worst less than or equal to 2/10  to facilitate minimal limitation in daily activity secondary to pain symptoms.  Goal status: Ongoing   03/12/2024   2. Patient will demonstrate independent use of home exercise program to facilitate ability to maintain/progress functional gains from skilled physical therapy services.  Goal status: Ongoing   4/292025   3. Patient will demonstrate Patient specific functional scale avg > or = 8/10 to indicate reduced disability due to condition.   Goal status:  Ongoing   03/12/2024   4.  Patient will demonstrate Rt LE MMT 5/5 throughout to faciltiate usual transfers, stairs, squatting at [facility] for daily life.   Goal status: Ongoing   03/12/2024   5.  Patient will demonstrate Rt knee AROM 0-110 deg to facilitate transfers and other daily mobility at PLOF.  Goal status: New   6.  Patient will demonstrate independent ambulation > 500 ft s deviation for community integration. Goal status: Ongoing   03/12/2024 (using straight cane)   7.  Patient will demonstrate ascending/descending stairs reciprocally s UE assist for community integration.   Goal Status: Ongoing   03/12/2024   PLAN:  PT FREQUENCY: 1-2x/week  PT DURATION: 10 weeks  PLANNED INTERVENTIONS: Can include 65784- PT Re-evaluation, 97110-Therapeutic exercises, 97530- Therapeutic activity, 97112- Neuromuscular re-education, 97535- Self Care, 97140- Manual therapy, [phone]- Gait training, [phone]- Orthotic Fit/training, [phone]- Canalith repositioning, [id number]- Aquatic Therapy, [phone]- Electrical stimulation (unattended), [phone]- Electrical stimulation (manual), [id number] Physical performance testing, 97016- Vasopneumatic device, [id number]- Ultrasound, [id number]- Traction (mechanical), [id number]- Ionotophoresis 4mg /ml Dexamethasone , Patient/Family education, Balance training, Stair training, Taping, Dry Needling, Joint mobilization, Joint manipulation, Spinal manipulation, Spinal mobilization, Scar mobilization, Vestibular training, Visual/preceptual  remediation/compensation, DME instructions, Cryotherapy, and Moist heat.  All performed as medically necessary.  All included unless contraindicated  PLAN FOR NEXT SESSION: Progress note update next week.    Bonna Bustard, PT, DPT, OCS, ATC 03/22/24  10:50 AM

## 2024-03-25 ENCOUNTER — Encounter: Payer: Self-pay | Admitting: Internal Medicine

## 2024-03-26 ENCOUNTER — Ambulatory Visit (INDEPENDENT_AMBULATORY_CARE_PROVIDER_SITE_OTHER): Admitting: Rehabilitative and Restorative Service Providers"

## 2024-03-26 ENCOUNTER — Encounter: Payer: Self-pay | Admitting: Rehabilitative and Restorative Service Providers"

## 2024-03-26 DIAGNOSIS — M25661 Stiffness of right knee, not elsewhere classified: Secondary | ICD-10-CM | POA: Diagnosis not present

## 2024-03-26 DIAGNOSIS — M6281 Muscle weakness (generalized): Secondary | ICD-10-CM | POA: Diagnosis not present

## 2024-03-26 DIAGNOSIS — R262 Difficulty in walking, not elsewhere classified: Secondary | ICD-10-CM | POA: Diagnosis not present

## 2024-03-26 DIAGNOSIS — M25561 Pain in right knee: Secondary | ICD-10-CM

## 2024-03-26 DIAGNOSIS — G8929 Other chronic pain: Secondary | ICD-10-CM

## 2024-03-26 DIAGNOSIS — R600 Localized edema: Secondary | ICD-10-CM

## 2024-03-26 NOTE — Therapy (Signed)
 OUTPATIENT PHYSICAL THERAPY TREATMENT   Patient Name: Craig House MRN: [id number] DOB:1961-03-04, 63 y.o., male Today's Date: 03/26/2024  END OF SESSION:  PT End of Session - 03/26/24 1515     Visit Number 11    Number of Visits 20    Date for PT Re-Evaluation 04/30/24    Authorization Type [employer]    PT Start Time 1508    PT Stop Time 1548    PT Time Calculation (min) 40 min    Activity Tolerance Patient tolerated treatment well    Behavior During Therapy [facility] for tasks assessed/performed                       Past Medical History:  Diagnosis Date   Allergy    spring   Anemia    Arthritis    Asthma    Diabetes mellitus without complication (HCC)    type II   Hyperplastic colon polyp    Hypertension    Obesity    Past Surgical History:  Procedure Laterality Date   KNEE ARTHROSCOPY Bilateral    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   Procedure: LEFT TOTAL KNEE ARTHROPLASTY;  Surgeon: Wes Hamman, MD;  Location: [facility] OR;  Service: Orthopedics;  Laterality: Left;   TOTAL KNEE ARTHROPLASTY Right 01/29/2024   Procedure: RIGHT TOTAL KNEE ARTHROPLASTY;  Surgeon: Wes Hamman, MD;  Location: [facility] OR;  Service: Orthopedics;  Laterality: Right;   Patient Active Problem List   Diagnosis Date Noted   Status post total right knee replacement 01/29/2024   Primary osteoarthritis of right knee 12/26/2023   Status post total left knee replacement 01/09/2023   Bilateral primary osteoarthritis of knee 06/19/2018   Mild intermittent asthma, uncomplicated 01/18/2016   HTN (hypertension) 12/31/2011    PCP: Sylvia Everts, PA - C  REFERRING PROVIDER: Sandie Cross, PA-C  REFERRING DIAG: [phone] (ICD-10-CM) - Status post total right knee replacement  THERAPY DIAG:  Chronic pain of right knee  Stiffness of right knee, not elsewhere classified  Muscle weakness (generalized)  Difficulty in walking, not elsewhere classified  Localized edema  Rationale  for Evaluation and Treatment: Rehabilitation  ONSET DATE: 01/29/2024 surgery  SUBJECTIVE:   SUBJECTIVE STATEMENT: Pt indicated having less swelling.  Pt reported pain levels coming down too.  Can still noticed at nighttime and in stretching.  PERTINENT HISTORY: PMH includes anemia, asthma, DM, HTN and Lt TKA 2024.  PAIN:  NPRS scale: at worst 2-3/10 Pain location: Rt knee  Pain description: sharp pain, ache/tightness.  Aggravating factors: static positioning, end ranges, WB pressure.  Relieving factors: medicine, ice  PRECAUTIONS: None  WEIGHT BEARING RESTRICTIONS: No  FALLS:  Has patient fallen in last 6 months? No  LIVING ENVIRONMENT: Lives with: girlfriend Lives in: House/apartment Stairs: flight to bedroom, stairs to enter house with handrail Has following equipment at home: [facility], FWW  OCCUPATION: [employer].  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  Waist to waist lifting.   PLOF: Independent, walking for exercise, previous bike riding.  [employer]  PATIENT GOALS: Reduce pain, get back to work and activity.   OBJECTIVE:   PATIENT SURVEYS:  Patient-Specific Activity Scoring Scheme  "0" represents "unable to perform." "10" represents "able to perform at prior level. 0 1 2 3 4 5 6 7 8 9  10 (Date and Score)   Activity Eval  02/20/2024 03/12/24   1. Stairs  2  3   2. standing 4  6   3. showering 4 4  4.  Prolonged static positioning (sleeping, sitting) 2 3  5.    Score 3 avg 4   Total score = sum of the activity scores/number of activities Minimum detectable change (90%CI) for average score = 2 points Minimum detectable change (90%CI) for single activity score = 3 points  COGNITION: 02/20/2024 Overall cognitive status: WFL    SENSATION: 02/20/2024 Not tested  EDEMA:  02/20/2024 Localized edema noted in Rt knee/lower leg   MUSCLE LENGTH: 02/20/2024 No specific testing  POSTURE:  02/20/2024 Nothing specific noted impacting Rt knee other  than weight shift towards Lt in standing.   PALPATION: 02/20/2024 Mild tenderness anterior knee joint, incision area.   LOWER EXTREMITY ROM:   ROM Right Eval 02/20/2024 Right 02/29/24 Right 03/06/24 Rt 03/12/24  Hip flexion      Hip extension      Hip abduction      Hip adduction      Hip internal rotation      Hip external rotation      Knee flexion 109 AROM in supine heel slide  106 in supine AROM heel slide Seated P: 113* A: 107* Seated A: 108 P: 112 Supine:  A:   Knee extension -31 AROM in seated LAQ  Supine heel prop: -11  -6 in supine quad set LAQ -7* Supine: A: -7 P; -5   Ankle dorsiflexion      Ankle plantarflexion      Ankle inversion      Ankle eversion       (Blank rows = not tested)  LOWER EXTREMITY MMT:  MMT Right Eval 02/20/2024 Left Eval 02/20/2024 Right 03/21/2024 Left 03/21/2024  Hip flexion 5/5 5/5    Hip extension      Hip abduction      Hip adduction      Hip internal rotation      Hip external rotation      Knee flexion 4/5 5/5    Knee extension 3+/5 5/5 4/5 27.1, 25 lbs 5/5 60, 65.8 lbs  Ankle dorsiflexion 5/5 5/5    Ankle plantarflexion      Ankle inversion      Ankle eversion       (Blank rows = not tested)  LOWER EXTREMITY SPECIAL TESTS:  02/20/2024 No specific testing today  FUNCTIONAL TESTS:  03/26/2024: 18 inch chair transfer: s UE assist on 1st try TUG independent. 11.6 seconds  02/20/2024 18 inch chair transfer: able to perform without UE assist after several tries with deviation to Lt LE Lt SLS: 10 seconds Rt SLS: unable unassisted.   GAIT: 03/26/2024:  Ambulation independent in clinic, arrived with [facility] but did not use on every step.   02/20/2024 Distance walked: household distances  Assistive device utilized: SPC use in Lt UE Level of assistance: Modified independence Comments: reduced stance on Rt leg, maintained knee flexion  TODAY'S TREATMENT                                                                          DATE: 03/26/2024 Therex: Recumbent bike 3 10 mins , seat 7 Incline gastroc stretch 30 sec x 4 bilateral  Machine leg extension double leg up, single leg lowering Rt 5 lbs 2 x 15 - additional time for slow control movement focus for optimal strength gains.     TherActivity (improve stairs, transfers, squatting) Lateral step down 6 inch step WB on Rt leg c focus on slow lowering 2 x 15 Step on over and down 4 inch step x 16    TODAY'S TREATMENT                                                                          DATE: 03/22/2024 Therex: Recumbent bike 3 8 mins , seat 7 Incline gastroc stretch 30 sec x 3 bilateral  Machine leg extension double leg up, single leg lowering Rt 5 lbs 2 x 10  Machine Rt leg curl 10 lbs 2 x 10  Extension TKE stretch at start of vaso to tolerance 5 mins  Neuro Re-ed (balance control, coordination) SLS with contralateral leg step over touch and back 6 inch hurdle 2 x 10 bilateral  Lateral step over 6 inch hurdle both feet x 15 each way  TherActivity (improve stairs, transfers, squatting) Leg press double leg 100 lbs x 20 slow lowering focus, single leg Rt 56 lbs 2 x 15  Lateral step down 6 inch step WB on Rt leg c focus on slow lowering 2 x 10   Vasopneumatic device 10 mins Rt knee medium compression   TODAY'S TREATMENT                                                                          DATE: 03/21/2024 Therex: Recumbent bike 3 8 mins , seat 7 Incline gastroc stretch 30 sec x 3 bilateral  Extension TKE stretch at start of vaso to tolerance  Neuro Re-ed (balance control, coordination) Tandem stance on foam in // bars with minimal HHA 1 min x 1 bilateral  SLS with corner touching contralateral leg on black mat x 6 each, peformed bilaterally with occasional HHA on bar  TherActivity (improve  stairs, transfers, squatting) Leg press double leg 100 lbs x 20 slow lowering focus, single leg Rt 56 lbs 2 x 15  Step on over and down 4 inch step WB on Rt leg no UE assist x 10   Vasopneumatic device 10 mins Rt knee medium compression   TODAY'S TREATMENT  DATE: 03/14/2024 Therapeutic Exercise: scifit bike lvl 3.0 seat 10 for ROM/strengthening - 10 mins  Calf stretch: slant board x 3 holding 30 sec Therapeutic Activities: (strength Sit to stand: x 10  Step up and down 6 inch step x 15 (up with right down with left) Leg press DL 161# 0R60, then Right leg only 56# 2X10 Side stepping x 3 in parallel bars without UE suppport Walking forwad/backward in //bars without UE support X 3 trips Seated LAQ 3# 2X10 Manual Therapy: Grade 3-4 mobs with traction & tibial IR for knee flexion range Vaso 10 mins Rt knee medium compression in elevation ext stretch 34 deg.      PATIENT EDUCATION:  02/20/2024 Education details: HEP, POC Person educated: Patient Education method: Programmer, multimedia, Demonstration, Verbal cues, and Handouts Education comprehension: verbalized understanding, returned demonstration, and verbal cues required  HOME EXERCISE PROGRAM: Access Code: [email or web address] URL: [email or web address] Date: 03/05/2024 Prepared by: Lorie Rook  Exercises - Supine Heel Slide  - 2-3 x daily - 7 x weekly - 1 sets - 10 reps - 5 hold - Supine Quadricep Sets  - 2-3 x daily - 7 x weekly - 1 sets - 10 reps - 5 hold - Seated Long Arc Quad  - 2-3 x daily - 7 x weekly - 1 sets - 5-10 reps - 2 hold - Supine Knee Extension Mobilization with Weight  - 2-3 x daily - 7 x weekly - 1 sets - 1 reps - to tolerance up to 5 mins hold - Seated Knee Extension Stretch with Chair  - 2-3 x daily - 7 x weekly - 1 sets - 1 reps - 3-5 mins hold - Seated Quad Set  - 2-3 x daily - 7 x weekly - 1 sets - 10 reps - 5 hold - Seated Calf Stretch  with Strap  - 2-3 x daily - 7 x weekly - 1 sets - 3-5 reps - 30 hold - Hooklying Hamstring Stretch with Strap  - 1-3 x daily - 7 x weekly - 1 sets - 3 reps - 30 seconds hold - Gastroc Stretch on Step  - 1-3 x daily - 7 x weekly - 1 sets - 3 reps - 30 seconds hold - Supine Quadriceps Stretch with Strap on Table  - 1-3 x daily - 7 x weekly - 1 sets - 3 reps - 30 seconds hold - Tandem Stance  - 1 x daily - 7 x weekly - 3 sets - 2 reps - 30 seconds hold  ASSESSMENT:  CLINICAL IMPRESSION: Pt continued to show improvement in WB stair control.  Step down forward on higher step would still be troublesome but control improving.     OBJECTIVE IMPAIRMENTS: Abnormal gait, decreased activity tolerance, decreased balance, decreased coordination, decreased endurance, decreased mobility, difficulty walking, decreased ROM, decreased strength, hypomobility, increased edema, increased fascial restrictions, impaired perceived functional ability, increased muscle spasms, impaired flexibility, improper body mechanics, and pain.   ACTIVITY LIMITATIONS: lifting, bending, sitting, standing, squatting, sleeping, stairs, transfers, bed mobility, and locomotion level  PARTICIPATION LIMITATIONS: meal prep, cleaning, laundry, interpersonal relationship, driving, shopping, community activity, and occupation  PERSONAL FACTORS:  PMH includes anemia, asthma, DM, HTN and Lt TKA 2024. are also affecting patient's functional outcome.   REHAB POTENTIAL: Good  CLINICAL DECISION MAKING: Evolving/moderate complexity  EVALUATION COMPLEXITY: Moderate   GOALS: Goals reviewed with patient? Yes  SHORT TERM GOALS: (target date for Short term goals are 3 weeks 03/12/2024)   1.  Patient will demonstrate independent  use of home exercise program to maintain progress from in clinic treatments.  Goal status: Met  LONG TERM GOALS: (target dates for all long term goals are 10 weeks  04/30/2024 )   1. Patient will demonstrate/report  pain at worst less than or equal to 2/10 to facilitate minimal limitation in daily activity secondary to pain symptoms.  Goal status: Ongoing   03/12/2024   2. Patient will demonstrate independent use of home exercise program to facilitate ability to maintain/progress functional gains from skilled physical therapy services.  Goal status: Ongoing   4/292025   3. Patient will demonstrate Patient specific functional scale avg > or = 8/10 to indicate reduced disability due to condition.   Goal status:  Ongoing   03/12/2024   4.  Patient will demonstrate Rt LE MMT 5/5 throughout to faciltiate usual transfers, stairs, squatting at [facility] for daily life.   Goal status: Ongoing   03/12/2024   5.  Patient will demonstrate Rt knee AROM 0-110 deg to facilitate transfers and other daily mobility at PLOF.  Goal status: New   6.  Patient will demonstrate independent ambulation > 500 ft s deviation for community integration. Goal status: Ongoing   03/12/2024 (using straight cane)   7.  Patient will demonstrate ascending/descending stairs reciprocally s UE assist for community integration.   Goal Status: Ongoing   03/12/2024   PLAN:  PT FREQUENCY: 1-2x/week  PT DURATION: 10 weeks  PLANNED INTERVENTIONS: Can include 65784- PT Re-evaluation, 97110-Therapeutic exercises, 97530- Therapeutic activity, 97112- Neuromuscular re-education, 97535- Self Care, 97140- Manual therapy, [phone]- Gait training, [phone]- Orthotic Fit/training, [phone]- Canalith repositioning, [id number]- Aquatic Therapy, [phone]- Electrical stimulation (unattended), [phone]- Electrical stimulation (manual), [id number] Physical performance testing, 97016- Vasopneumatic device, [id number]- Ultrasound, [id number]- Traction (mechanical), [id number]- Ionotophoresis 4mg /ml Dexamethasone , Patient/Family education, Balance training, Stair training, Taping, Dry Needling, Joint mobilization, Joint manipulation, Spinal manipulation, Spinal mobilization, Scar mobilization, Vestibular  training, Visual/preceptual remediation/compensation, DME instructions, Cryotherapy, and Moist heat.  All performed as medically necessary.  All included unless contraindicated  PLAN FOR NEXT SESSION: Plan for progress note next visit with strength measurements.    Bonna Bustard, PT, DPT, OCS, ATC 03/26/24  3:48 PM

## 2024-03-28 ENCOUNTER — Encounter: Payer: Self-pay | Admitting: Rehabilitative and Restorative Service Providers"

## 2024-03-28 ENCOUNTER — Ambulatory Visit: Admitting: Rehabilitative and Restorative Service Providers"

## 2024-03-28 DIAGNOSIS — M6281 Muscle weakness (generalized): Secondary | ICD-10-CM

## 2024-03-28 DIAGNOSIS — M25561 Pain in right knee: Secondary | ICD-10-CM

## 2024-03-28 DIAGNOSIS — R262 Difficulty in walking, not elsewhere classified: Secondary | ICD-10-CM | POA: Diagnosis not present

## 2024-03-28 DIAGNOSIS — R600 Localized edema: Secondary | ICD-10-CM

## 2024-03-28 DIAGNOSIS — M25661 Stiffness of right knee, not elsewhere classified: Secondary | ICD-10-CM | POA: Diagnosis not present

## 2024-03-28 DIAGNOSIS — G8929 Other chronic pain: Secondary | ICD-10-CM

## 2024-03-28 NOTE — Therapy (Signed)
 OUTPATIENT PHYSICAL THERAPY TREATMENT/ PROGRESS NOTE   Patient Name: Craig House MRN: [id number] DOB:1961/09/28, 63 y.o., male Today's Date: 03/28/2024  Progress Note Reporting Period 03/06/2024 to 03/28/2024  See note below for Objective Data and Assessment of Progress/Goals.    END OF SESSION:  PT End of Session - 03/28/24 1520     Visit Number 12    Number of Visits 20    Date for PT Re-Evaluation 04/30/24    Authorization Type [employer]    Progress Note Due on Visit 20    PT Start Time 1512    PT Stop Time 1552    PT Time Calculation (min) 40 min    Activity Tolerance Patient tolerated treatment well    Behavior During Therapy [facility] for tasks assessed/performed                        Past Medical History:  Diagnosis Date   Allergy    spring   Anemia    Arthritis    Asthma    Diabetes mellitus without complication (HCC)    type II   Hyperplastic colon polyp    Hypertension    Obesity    Past Surgical History:  Procedure Laterality Date   KNEE ARTHROSCOPY Bilateral    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   Procedure: LEFT TOTAL KNEE ARTHROPLASTY;  Surgeon: Wes Hamman, MD;  Location: [facility] OR;  Service: Orthopedics;  Laterality: Left;   TOTAL KNEE ARTHROPLASTY Right 01/29/2024   Procedure: RIGHT TOTAL KNEE ARTHROPLASTY;  Surgeon: Wes Hamman, MD;  Location: [facility] OR;  Service: Orthopedics;  Laterality: Right;   Patient Active Problem List   Diagnosis Date Noted   Status post total right knee replacement 01/29/2024   Primary osteoarthritis of right knee 12/26/2023   Status post total left knee replacement 01/09/2023   Bilateral primary osteoarthritis of knee 06/19/2018   Mild intermittent asthma, uncomplicated 01/18/2016   HTN (hypertension) 12/31/2011    PCP: Sylvia Everts, PA - C  REFERRING PROVIDER: Sandie Cross, PA-C  REFERRING DIAG: [phone] (ICD-10-CM) - Status post total right knee replacement  THERAPY DIAG:  Chronic pain  of right knee  Stiffness of right knee, not elsewhere classified  Muscle weakness (generalized)  Difficulty in walking, not elsewhere classified  Localized edema  Rationale for Evaluation and Treatment: Rehabilitation  ONSET DATE: 01/29/2024 surgery  SUBJECTIVE:   SUBJECTIVE STATEMENT: Pt indicated overall improvement to normal at 75%.  Global rating of change rated at +6 a great deal better.   PERTINENT HISTORY: PMH includes anemia, asthma, DM, HTN and Lt TKA 2024.  PAIN:  NPRS scale: at worst in last week 2-3/10.  Pain location: Rt knee  Pain description: sharp pain, ache/tightness.  Aggravating factors: static positioning, end ranges, WB pressure.  Relieving factors: medicine, ice  PRECAUTIONS: None  WEIGHT BEARING RESTRICTIONS: No  FALLS:  Has patient fallen in last 6 months? No  LIVING ENVIRONMENT: Lives with: girlfriend Lives in: House/apartment Stairs: flight to bedroom, stairs to enter house with handrail Has following equipment at home: [facility], FWW  OCCUPATION: [employer].  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  Waist to waist lifting.   PLOF: Independent, walking for exercise, previous bike riding.  [employer]  PATIENT GOALS: Reduce pain, get back to work and activity.   OBJECTIVE:   PATIENT SURVEYS:  Patient-Specific Activity Scoring Scheme  "0" represents "unable to perform." "10" represents "able to perform at  prior level. 0 1 2 3 4 5 6 7 8 9  10 (Date and Score)   Activity Eval  02/20/2024 03/12/24  03/28/2024  1. Stairs  2  3  7   2. standing 4 6  7   3. showering 4 4 10   4.  Prolonged static positioning (sleeping, sitting) 2 3 7   5.     Score 3 avg 4 7.75 avg   Total score = sum of the activity scores/number of activities Minimum detectable change (90%CI) for average score = 2 points Minimum detectable change (90%CI) for single activity score = 3 points  COGNITION: 02/20/2024 Overall cognitive status:  WFL    SENSATION: 02/20/2024 Not tested  EDEMA:  02/20/2024 Localized edema noted in Rt knee/lower leg   MUSCLE LENGTH: 02/20/2024 No specific testing  POSTURE:  02/20/2024 Nothing specific noted impacting Rt knee other than weight shift towards Lt in standing.   PALPATION: 02/20/2024 Mild tenderness anterior knee joint, incision area.   LOWER EXTREMITY ROM:   ROM Right Eval 02/20/2024 Right 02/29/24 Right 03/06/24 Rt 03/12/24 Right 03/28/2024  Hip flexion       Hip extension       Hip abduction       Hip adduction       Hip internal rotation       Hip external rotation       Knee flexion 109 AROM in supine heel slide  106 in supine AROM heel slide Seated P: 113* A: 107* Seated A: 108 P: 112 Supine:  A:    Knee extension -31 AROM in seated LAQ  Supine heel prop: -11  -6 in supine quad set LAQ -7* Supine: A: -7 P; -5    Ankle dorsiflexion       Ankle plantarflexion       Ankle inversion       Ankle eversion        (Blank rows = not tested)  LOWER EXTREMITY MMT:  MMT Right Eval 02/20/2024 Left Eval 02/20/2024 Right 03/21/2024 Left 03/21/2024 Right 03/28/2024  Hip flexion 5/5 5/5     Hip extension       Hip abduction       Hip adduction       Hip internal rotation       Hip external rotation       Knee flexion 4/5 5/5     Knee extension 3+/5 5/5 4/5 27.1, 25 lbs 5/5 60, 65.8 lbs 4/5 32, 30 lbs  Ankle dorsiflexion 5/5 5/5     Ankle plantarflexion       Ankle inversion       Ankle eversion        (Blank rows = not tested)  LOWER EXTREMITY SPECIAL TESTS:  02/20/2024 No specific testing today  FUNCTIONAL TESTS:  03/26/2024: 18 inch chair transfer: s UE assist on 1st try TUG independent. 11.6 seconds  02/20/2024 18 inch chair transfer: able to perform without UE assist after several tries with deviation to Lt LE Lt SLS: 10 seconds Rt SLS: unable unassisted.   GAIT: 03/26/2024:  Ambulation independent in clinic, arrived with [facility] but did not use on every  step.   02/20/2024 Distance walked: household distances  Assistive device utilized: SPC use in Lt UE Level of assistance: Modified independence Comments: reduced stance on Rt leg, maintained knee flexion  TODAY'S TREATMENT                                                                          DATE: 03/28/2024 Therex: Recumbent bike 4 10 mins , seat 7 Incline gastroc stretch 30 sec x 5 bilateral  Machine leg extension double leg up, single leg lowering Rt 10 lbs x 10 5 lbs 2 x 15  - additional time for slow control movement focus for optimal strength gains.    TherActivity (improve stairs, transfers, squatting) Lateral step down 6 inch step WB on Rt leg c focus on slow lowering x15 Flight of stairs in clinic with reciprocal gait pattern with single rail assist up/down.  Time spend in education about home activity use for stair navigation improvements.   TODAY'S TREATMENT                                                                          DATE: 03/26/2024 Therex: Recumbent bike 3 10 mins , seat 7 Incline gastroc stretch 30 sec x 4 bilateral  Machine leg extension double leg up, single leg lowering Rt 5 lbs 2 x 15 - additional time for slow control movement focus for optimal strength gains.   TherActivity (improve stairs, transfers, squatting) Lateral step down 6 inch step WB on Rt leg c focus on slow lowering 2 x 15 Step on over and down 4 inch step x 16    TODAY'S TREATMENT                                                                          DATE: 03/22/2024 Therex: Recumbent bike 3 8 mins , seat 7 Incline gastroc stretch 30 sec x 3 bilateral  Machine leg extension double leg up, single leg lowering Rt 5 lbs 2 x 10  Machine Rt leg curl 10 lbs 2 x 10  Extension TKE stretch at start of vaso to tolerance 5  mins  Neuro Re-ed (balance control, coordination) SLS with contralateral leg step over touch and back 6 inch hurdle 2 x 10 bilateral  Lateral step over 6 inch hurdle both feet x 15 each way  TherActivity (improve stairs, transfers, squatting) Leg press double leg 100 lbs x 20 slow lowering focus, single leg Rt 56 lbs 2 x 15  Lateral step down 6 inch step WB on Rt leg c focus on slow lowering 2 x 10   Vasopneumatic device 10 mins Rt knee medium compression   TODAY'S TREATMENT  DATE: 03/21/2024 Therex: Recumbent bike 3 8 mins , seat 7 Incline gastroc stretch 30 sec x 3 bilateral  Extension TKE stretch at start of vaso to tolerance  Neuro Re-ed (balance control, coordination) Tandem stance on foam in // bars with minimal HHA 1 min x 1 bilateral  SLS with corner touching contralateral leg on black mat x 6 each, peformed bilaterally with occasional HHA on bar  TherActivity (improve stairs, transfers, squatting) Leg press double leg 100 lbs x 20 slow lowering focus, single leg Rt 56 lbs 2 x 15  Step on over and down 4 inch step WB on Rt leg no UE assist x 10   Vasopneumatic device 10 mins Rt knee medium compression    PATIENT EDUCATION:  02/20/2024 Education details: HEP, POC Person educated: Patient Education method: Programmer, multimedia, Facilities manager, Verbal cues, and Handouts Education comprehension: verbalized understanding, returned demonstration, and verbal cues required  HOME EXERCISE PROGRAM: Access Code: [email or web address] URL: [email or web address] Date: 03/05/2024 Prepared by: Lorie Rook  Exercises - Supine Heel Slide  - 2-3 x daily - 7 x weekly - 1 sets - 10 reps - 5 hold - Supine Quadricep Sets  - 2-3 x daily - 7 x weekly - 1 sets - 10 reps - 5 hold - Seated Long Arc Quad  - 2-3 x daily - 7 x weekly - 1 sets - 5-10 reps - 2 hold - Supine Knee Extension Mobilization with Weight  - 2-3 x daily - 7  x weekly - 1 sets - 1 reps - to tolerance up to 5 mins hold - Seated Knee Extension Stretch with Chair  - 2-3 x daily - 7 x weekly - 1 sets - 1 reps - 3-5 mins hold - Seated Quad Set  - 2-3 x daily - 7 x weekly - 1 sets - 10 reps - 5 hold - Seated Calf Stretch with Strap  - 2-3 x daily - 7 x weekly - 1 sets - 3-5 reps - 30 hold - Hooklying Hamstring Stretch with Strap  - 1-3 x daily - 7 x weekly - 1 sets - 3 reps - 30 seconds hold - Gastroc Stretch on Step  - 1-3 x daily - 7 x weekly - 1 sets - 3 reps - 30 seconds hold - Supine Quadriceps Stretch with Strap on Table  - 1-3 x daily - 7 x weekly - 1 sets - 3 reps - 30 seconds hold - Tandem Stance  - 1 x daily - 7 x weekly - 3 sets - 2 reps - 30 seconds hold  ASSESSMENT:  CLINICAL IMPRESSION: The patient has attended 12 visits over the course of treatment cycle.  Patient has reported overall improvement at 75% with global rating of change at +6.  See objective data above for updated information regarding current presentation.  May continue to benefit from progressive strengthening gains to keep helping WB loading.  Pt progression has made talks of HEP transitioning in future more reasonable when appropriate.    OBJECTIVE IMPAIRMENTS: Abnormal gait, decreased activity tolerance, decreased balance, decreased coordination, decreased endurance, decreased mobility, difficulty walking, decreased ROM, decreased strength, hypomobility, increased edema, increased fascial restrictions, impaired perceived functional ability, increased muscle spasms, impaired flexibility, improper body mechanics, and pain.   ACTIVITY LIMITATIONS: lifting, bending, sitting, standing, squatting, sleeping, stairs, transfers, bed mobility, and locomotion level  PARTICIPATION LIMITATIONS: meal prep, cleaning, laundry, interpersonal relationship, driving, shopping, community activity, and occupation  PERSONAL FACTORS:  PMH includes anemia, asthma, DM, HTN and Lt  TKA 2024. are also  affecting patient's functional outcome.   REHAB POTENTIAL: Good  CLINICAL DECISION MAKING: Evolving/moderate complexity  EVALUATION COMPLEXITY: Moderate   GOALS: Goals reviewed with patient? Yes  SHORT TERM GOALS: (target date for Short term goals are 3 weeks 03/12/2024)   1.  Patient will demonstrate independent use of home exercise program to maintain progress from in clinic treatments.  Goal status: Met  LONG TERM GOALS: (target dates for all long term goals are 10 weeks  04/30/2024 )   1. Patient will demonstrate/report pain at worst less than or equal to 2/10 to facilitate minimal limitation in daily activity secondary to pain symptoms.  Goal status: Ongoing   03/12/2024   2. Patient will demonstrate independent use of home exercise program to facilitate ability to maintain/progress functional gains from skilled physical therapy services.  Goal status: Ongoing   4/292025   3. Patient will demonstrate Patient specific functional scale avg > or = 8/10 to indicate reduced disability due to condition.   Goal status:  Ongoing   03/12/2024   4.  Patient will demonstrate Rt LE MMT 5/5 throughout to faciltiate usual transfers, stairs, squatting at [facility] for daily life.   Goal status: Ongoing   03/12/2024   5.  Patient will demonstrate Rt knee AROM 0-110 deg to facilitate transfers and other daily mobility at PLOF.  Goal status: New   6.  Patient will demonstrate independent ambulation > 500 ft s deviation for community integration. Goal status: Ongoing   03/12/2024 (using straight cane)   7.  Patient will demonstrate ascending/descending stairs reciprocally s UE assist for community integration.   Goal Status: Ongoing   03/12/2024   PLAN:  PT FREQUENCY: 1-2x/week  PT DURATION: 10 weeks  PLANNED INTERVENTIONS: Can include 78295- PT Re-evaluation, 97110-Therapeutic exercises, 97530- Therapeutic activity, 97112- Neuromuscular re-education, 97535- Self Care, 97140- Manual  therapy, [phone]- Gait training, [phone]- Orthotic Fit/training, [phone]- Canalith repositioning, [id number]- Aquatic Therapy, [phone]- Electrical stimulation (unattended), [phone]- Electrical stimulation (manual), [id number] Physical performance testing, [id number]- Vasopneumatic device, [id number]- Ultrasound, [id number]- Traction (mechanical), [id number]- Ionotophoresis 4mg /ml Dexamethasone , Patient/Family education, Balance training, Stair training, Taping, Dry Needling, Joint mobilization, Joint manipulation, Spinal manipulation, Spinal mobilization, Scar mobilization, Vestibular training, Visual/preceptual remediation/compensation, DME instructions, Cryotherapy, and Moist heat.  All performed as medically necessary.  All included unless contraindicated  PLAN FOR NEXT SESSION: 2 more visits prior to MD visit.    Bonna Bustard, PT, DPT, OCS, ATC 03/28/24  3:51 PM

## 2024-04-02 ENCOUNTER — Ambulatory Visit (INDEPENDENT_AMBULATORY_CARE_PROVIDER_SITE_OTHER): Admitting: Rehabilitative and Restorative Service Providers"

## 2024-04-02 ENCOUNTER — Encounter: Payer: Self-pay | Admitting: Rehabilitative and Restorative Service Providers"

## 2024-04-02 DIAGNOSIS — M25661 Stiffness of right knee, not elsewhere classified: Secondary | ICD-10-CM | POA: Diagnosis not present

## 2024-04-02 DIAGNOSIS — M6281 Muscle weakness (generalized): Secondary | ICD-10-CM

## 2024-04-02 DIAGNOSIS — G8929 Other chronic pain: Secondary | ICD-10-CM

## 2024-04-02 DIAGNOSIS — M25561 Pain in right knee: Secondary | ICD-10-CM

## 2024-04-02 DIAGNOSIS — R262 Difficulty in walking, not elsewhere classified: Secondary | ICD-10-CM

## 2024-04-02 DIAGNOSIS — R600 Localized edema: Secondary | ICD-10-CM

## 2024-04-02 NOTE — Therapy (Signed)
 OUTPATIENT PHYSICAL THERAPY TREATMENT   Patient Name: Craig House MRN: [id number] DOB:October 10, 1961, 63 y.o., male Today's Date: 04/02/2024    END OF SESSION:  PT End of Session - 04/02/24 1513     Visit Number 13    Number of Visits 20    Date for PT Re-Evaluation 04/30/24    Authorization Type [employer]    Progress Note Due on Visit 20    PT Start Time 1509    PT Stop Time 1548    PT Time Calculation (min) 39 min    Activity Tolerance Patient tolerated treatment well    Behavior During Therapy [facility] for tasks assessed/performed                         Past Medical History:  Diagnosis Date   Allergy    spring   Anemia    Arthritis    Asthma    Diabetes mellitus without complication (HCC)    type II   Hyperplastic colon polyp    Hypertension    Obesity    Past Surgical History:  Procedure Laterality Date   KNEE ARTHROSCOPY Bilateral    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   Procedure: LEFT TOTAL KNEE ARTHROPLASTY;  Surgeon: Wes Hamman, MD;  Location: [facility] OR;  Service: Orthopedics;  Laterality: Left;   TOTAL KNEE ARTHROPLASTY Right 01/29/2024   Procedure: RIGHT TOTAL KNEE ARTHROPLASTY;  Surgeon: Wes Hamman, MD;  Location: [facility] OR;  Service: Orthopedics;  Laterality: Right;   Patient Active Problem List   Diagnosis Date Noted   Status post total right knee replacement 01/29/2024   Primary osteoarthritis of right knee 12/26/2023   Status post total left knee replacement 01/09/2023   Bilateral primary osteoarthritis of knee 06/19/2018   Mild intermittent asthma, uncomplicated 01/18/2016   HTN (hypertension) 12/31/2011    PCP: Sylvia Everts, PA - C  REFERRING PROVIDER: Sandie Cross, PA-C  REFERRING DIAG: [phone] (ICD-10-CM) - Status post total right knee replacement  THERAPY DIAG:  Chronic pain of right knee  Stiffness of right knee, not elsewhere classified  Muscle weakness (generalized)  Difficulty in walking, not elsewhere  classified  Localized edema  Rationale for Evaluation and Treatment: Rehabilitation  ONSET DATE: 01/29/2024 surgery  SUBJECTIVE:   SUBJECTIVE STATEMENT: Pt indicated feeling some complaints yesterday after doing activity around house with walking, standing, kneeling, moving items.   PERTINENT HISTORY: PMH includes anemia, asthma, DM, HTN and Lt TKA 2024.  PAIN:  NPRS scale: at worst 7/10.  Upon arrival 3/10 Pain location: Rt knee  Pain description: sore/achy Aggravating factors: static positioning, end ranges, WB pressure.  Relieving factors: medicine, ice  PRECAUTIONS: None  WEIGHT BEARING RESTRICTIONS: No  FALLS:  Has patient fallen in last 6 months? 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  Waist to waist lifting.   PLOF: Independent, walking for exercise, previous bike riding.  [employer]  PATIENT GOALS: Reduce pain, get back to work and activity.   OBJECTIVE:   PATIENT SURVEYS:  Patient-Specific Activity Scoring Scheme  "0" represents "unable to perform." "10" represents "able to perform at prior level. 0 1 2 3 4 5 6 7 8 9  10 (Date and Score)   Activity Eval  02/20/2024 03/12/24  03/28/2024  1. Stairs  2  3  7   2. standing 4 6  7   3. showering 4 4 10   4.  Prolonged static positioning (sleeping, sitting) 2 3 7   5.     Score 3 avg 4 7.75 avg   Total score = sum of the activity scores/number of activities Minimum detectable change (90%CI) for average score = 2 points Minimum detectable change (90%CI) for single activity score = 3 points  COGNITION: 02/20/2024 Overall cognitive status: WFL    SENSATION: 02/20/2024 Not tested  EDEMA:  02/20/2024 Localized edema noted in Rt knee/lower leg   MUSCLE LENGTH: 02/20/2024 No specific  testing  POSTURE:  02/20/2024 Nothing specific noted impacting Rt knee other than weight shift towards Lt in standing.   PALPATION: 02/20/2024 Mild tenderness anterior knee joint, incision area.   LOWER EXTREMITY ROM:   ROM Right Eval 02/20/2024 Right 02/29/24 Right 03/06/24 Rt 03/12/24 Right 03/28/2024  Hip flexion       Hip extension       Hip abduction       Hip adduction       Hip internal rotation       Hip external rotation       Knee flexion 109 AROM in supine heel slide  106 in supine AROM heel slide Seated P: 113* A: 107* Seated A: 108 P: 112 Supine:  A:    Knee extension -31 AROM in seated LAQ  Supine heel prop: -11  -6 in supine quad set LAQ -7* Supine: A: -7 P; -5    Ankle dorsiflexion       Ankle plantarflexion       Ankle inversion       Ankle eversion        (Blank rows = not tested)  LOWER EXTREMITY MMT:  MMT Right Eval 02/20/2024 Left Eval 02/20/2024 Right 03/21/2024 Left 03/21/2024 Right 03/28/2024  Hip flexion 5/5 5/5     Hip extension       Hip abduction       Hip adduction       Hip internal rotation       Hip external rotation       Knee flexion 4/5 5/5     Knee extension 3+/5 5/5 4/5 27.1, 25 lbs 5/5 60, 65.8 lbs 4/5 32, 30 lbs  Ankle dorsiflexion 5/5 5/5     Ankle plantarflexion       Ankle inversion       Ankle eversion        (Blank rows = not tested)  LOWER EXTREMITY SPECIAL TESTS:  02/20/2024 No specific testing today  FUNCTIONAL TESTS:  03/26/2024: 18 inch chair transfer: s UE assist on 1st try TUG independent. 11.6 seconds  02/20/2024 18 inch chair transfer: able to perform without UE assist after several tries with deviation to Lt LE Lt SLS: 10 seconds Rt SLS: unable unassisted.   GAIT: 03/26/2024:  Ambulation independent in clinic, arrived with [facility] but did not use on every step.   02/20/2024 Distance walked: household distances  Assistive device utilized: SPC use in Lt UE Level of assistance: Modified  independence Comments: reduced stance on Rt leg, maintained knee flexion  TODAY'S TREATMENT                                                                          DATE: 04/02/2024 Therex: UBE LE only seat 11 10 mins lvl 3.5  Machine knee extension eccentric only Rt leg 5 lbs 3 x 15 with slow lowering focus Machine hamstring curl Rt leg 10 lbs 2 x 15  Additional time spent for slow control movement focus.   Neuro Re-ed (balance improvement, neuromuscular coordination/recruitment) SLS on airex foam with contralateral leg touching 3 cones semicircle anteriorly with occasional HHA x 8 each bilateral Tandem stance on foam 1 min x 2 bilateral with occasional HHA    TODAY'S TREATMENT                                                                          DATE: 03/28/2024 Therex: Recumbent bike 4 10 mins , seat 7 Incline gastroc stretch 30 sec x 5 bilateral  Machine leg extension double leg up, single leg lowering Rt 10 lbs x 10 5 lbs 2 x 15  - additional time for slow control movement focus for optimal strength gains.    TherActivity (improve stairs, transfers, squatting) Lateral step down 6 inch step WB on Rt leg c focus on slow lowering x15 Flight of stairs in clinic with reciprocal gait pattern with single rail assist up/down.  Time spend in education about home activity use for stair navigation improvements.   TODAY'S TREATMENT                                                                          DATE: 03/26/2024 Therex: Recumbent bike 3 10 mins , seat 7 Incline gastroc stretch 30 sec x 4 bilateral  Machine leg extension double leg up, single leg lowering Rt 5 lbs 2 x 15 - additional time for slow control movement focus for optimal strength gains.   TherActivity (improve stairs, transfers, squatting) Lateral step down  6 inch step WB on Rt leg c focus on slow lowering 2 x 15 Step on over and down 4 inch step x 16    TODAY'S TREATMENT                                                                          DATE: 03/22/2024 Therex: Recumbent bike 3 8 mins , seat 7 Incline gastroc stretch 30 sec x 3 bilateral  Machine leg extension double leg up, single leg lowering Rt 5 lbs 2 x 10  Machine Rt leg curl 10 lbs 2 x 10  Extension TKE stretch at start of vaso to tolerance 5 mins  Neuro Re-ed (balance control, coordination) SLS with contralateral leg step over touch and back 6 inch hurdle 2 x 10 bilateral  Lateral step over 6 inch hurdle both feet x 15 each way  TherActivity (improve stairs, transfers, squatting) Leg press double leg 100 lbs x 20 slow lowering focus, single leg Rt 56 lbs 2 x 15  Lateral step down 6 inch step WB on Rt leg c focus on slow lowering 2 x 10   Vasopneumatic device 10 mins Rt knee medium compression    PATIENT EDUCATION:  02/20/2024 Education details: HEP, POC Person educated: Patient Education method: Programmer, multimedia, Facilities manager, Verbal cues, and Handouts Education comprehension: verbalized understanding, returned demonstration, and verbal cues required  HOME EXERCISE PROGRAM: Access Code: [email or web address] URL: [email or web address] Date: 03/05/2024 Prepared by: Lorie Rook  Exercises - Supine Heel Slide  - 2-3 x daily - 7 x weekly - 1 sets - 10 reps - 5 hold - Supine Quadricep Sets  - 2-3 x daily - 7 x weekly - 1 sets - 10 reps - 5 hold - Seated Long Arc Quad  - 2-3 x daily - 7 x weekly - 1 sets - 5-10 reps - 2 hold - Supine Knee Extension Mobilization with Weight  - 2-3 x daily - 7 x weekly - 1 sets - 1 reps - to tolerance up to 5 mins hold - Seated Knee Extension Stretch with Chair  - 2-3 x daily - 7 x weekly - 1 sets - 1 reps - 3-5 mins hold - Seated Quad Set  - 2-3 x daily - 7 x weekly - 1 sets - 10 reps - 5 hold - Seated Calf Stretch with Strap  - 2-3 x daily  - 7 x weekly - 1 sets - 3-5 reps - 30 hold - Hooklying Hamstring Stretch with Strap  - 1-3 x daily - 7 x weekly - 1 sets - 3 reps - 30 seconds hold - Gastroc Stretch on Step  - 1-3 x daily - 7 x weekly - 1 sets - 3 reps - 30 seconds hold - Supine Quadriceps Stretch with Strap on Table  - 1-3 x daily - 7 x weekly - 1 sets - 3 reps - 30 seconds hold - Tandem Stance  - 1 x daily - 7 x weekly - 3 sets - 2 reps - 30 seconds hold  ASSESSMENT:  CLINICAL IMPRESSION: Continued progressive strengthening plan to help improve WB control.  Fair control on compliant surface activity for balance.    OBJECTIVE IMPAIRMENTS: Abnormal gait, decreased activity tolerance, decreased balance, decreased coordination, decreased endurance, decreased mobility, difficulty walking, decreased ROM, decreased strength, hypomobility, increased edema, increased fascial restrictions, impaired perceived functional ability, increased muscle spasms, impaired flexibility, improper body mechanics, and pain.   ACTIVITY LIMITATIONS: lifting, bending, sitting, standing, squatting, sleeping, stairs, transfers, bed mobility, and locomotion level  PARTICIPATION LIMITATIONS: meal prep, cleaning, laundry, interpersonal relationship, driving, shopping, community activity, and occupation  PERSONAL FACTORS:  PMH includes anemia, asthma, DM, HTN and Lt TKA 2024. are also affecting patient's functional outcome.   REHAB POTENTIAL: Good  CLINICAL DECISION MAKING: Evolving/moderate complexity  EVALUATION COMPLEXITY: Moderate   GOALS: Goals reviewed with patient? Yes  SHORT TERM GOALS: (target date for Short term goals are 3 weeks 03/12/2024)  1.  Patient will demonstrate independent use of home exercise program to maintain progress from in clinic treatments.  Goal status: Met  LONG TERM GOALS: (target dates for all long term goals are 10 weeks  04/30/2024 )   1. Patient will demonstrate/report pain at worst less than or equal to 2/10  to facilitate minimal limitation in daily activity secondary to pain symptoms.  Goal status: Ongoing   03/12/2024   2. Patient will demonstrate independent use of home exercise program to facilitate ability to maintain/progress functional gains from skilled physical therapy services.  Goal status: Ongoing   4/292025   3. Patient will demonstrate Patient specific functional scale avg > or = 8/10 to indicate reduced disability due to condition.   Goal status:  Ongoing   03/12/2024   4.  Patient will demonstrate Rt LE MMT 5/5 throughout to faciltiate usual transfers, stairs, squatting at [facility] for daily life.   Goal status: Ongoing   03/12/2024   5.  Patient will demonstrate Rt knee AROM 0-110 deg to facilitate transfers and other daily mobility at PLOF.  Goal status: New   6.  Patient will demonstrate independent ambulation > 500 ft s deviation for community integration. Goal status: Ongoing   03/12/2024 (using straight cane)   7.  Patient will demonstrate ascending/descending stairs reciprocally s UE assist for community integration.   Goal Status: Ongoing   03/12/2024   PLAN:  PT FREQUENCY: 1-2x/week  PT DURATION: 10 weeks  PLANNED INTERVENTIONS: Can include 62130- PT Re-evaluation, 97110-Therapeutic exercises, 97530- Therapeutic activity, 97112- Neuromuscular re-education, 97535- Self Care, 97140- Manual therapy, [phone]- Gait training, [phone]- Orthotic Fit/training, [phone]- Canalith repositioning, [id number]- Aquatic Therapy, [phone]- Electrical stimulation (unattended), [phone]- Electrical stimulation (manual), [id number] Physical performance testing, 97016- Vasopneumatic device, [id number]- Ultrasound, [id number]- Traction (mechanical), [id number]- Ionotophoresis 4mg /ml Dexamethasone , Patient/Family education, Balance training, Stair training, Taping, Dry Needling, Joint mobilization, Joint manipulation, Spinal manipulation, Spinal mobilization, Scar mobilization, Vestibular training, Visual/preceptual  remediation/compensation, DME instructions, Cryotherapy, and Moist heat.  All performed as medically necessary.  All included unless contraindicated  PLAN FOR NEXT SESSION: Recheck strength.    Bonna Bustard, PT, DPT, OCS, ATC 04/02/24  3:58 PM

## 2024-04-04 ENCOUNTER — Ambulatory Visit (INDEPENDENT_AMBULATORY_CARE_PROVIDER_SITE_OTHER): Admitting: Rehabilitative and Restorative Service Providers"

## 2024-04-04 ENCOUNTER — Encounter: Payer: Self-pay | Admitting: Rehabilitative and Restorative Service Providers"

## 2024-04-04 DIAGNOSIS — M25561 Pain in right knee: Secondary | ICD-10-CM | POA: Diagnosis not present

## 2024-04-04 DIAGNOSIS — M6281 Muscle weakness (generalized): Secondary | ICD-10-CM | POA: Diagnosis not present

## 2024-04-04 DIAGNOSIS — R600 Localized edema: Secondary | ICD-10-CM

## 2024-04-04 DIAGNOSIS — G8929 Other chronic pain: Secondary | ICD-10-CM

## 2024-04-04 DIAGNOSIS — R262 Difficulty in walking, not elsewhere classified: Secondary | ICD-10-CM

## 2024-04-04 DIAGNOSIS — M25661 Stiffness of right knee, not elsewhere classified: Secondary | ICD-10-CM | POA: Diagnosis not present

## 2024-04-04 NOTE — Therapy (Addendum)
 OUTPATIENT PHYSICAL THERAPY TREATMENT / DISCHARGE   Patient Name: Craig House MRN: [id number] DOB:02-12-1961, 63 y.o., male Today's Date: 04/04/2024   END OF SESSION:  PT End of Session - 04/04/24 1514     Visit Number 14    Number of Visits 20    Date for PT Re-Evaluation 04/30/24    Authorization Type [employer]    Progress Note Due on Visit 20    PT Start Time 1510    PT Stop Time 1549    PT Time Calculation (min) 39 min    Activity Tolerance Patient tolerated treatment well    Behavior During Therapy [facility] for tasks assessed/performed                Past Medical History:  Diagnosis Date   Allergy    spring   Anemia    Arthritis    Asthma    Diabetes mellitus without complication (HCC)    type II   Hyperplastic colon polyp    Hypertension    Obesity    Past Surgical History:  Procedure Laterality Date   KNEE ARTHROSCOPY Bilateral    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   Procedure: LEFT TOTAL KNEE ARTHROPLASTY;  Surgeon: Jerri Kay HERO, MD;  Location: [facility] OR;  Service: Orthopedics;  Laterality: Left;   TOTAL KNEE ARTHROPLASTY Right 01/29/2024   Procedure: RIGHT TOTAL KNEE ARTHROPLASTY;  Surgeon: Jerri Kay HERO, MD;  Location: [facility] OR;  Service: Orthopedics;  Laterality: Right;   Patient Active Problem List   Diagnosis Date Noted   Status post total right knee replacement 01/29/2024   Primary osteoarthritis of right knee 12/26/2023   Status post total left knee replacement 01/09/2023   Bilateral primary osteoarthritis of knee 06/19/2018   Mild intermittent asthma, uncomplicated 01/18/2016   HTN (hypertension) 12/31/2011    PCP: Dorina Loving, PA - C  REFERRING PROVIDER: Jule Ronal CROME, PA-C  REFERRING DIAG: [phone] (ICD-10-CM) - Status post total right knee replacement  THERAPY DIAG:  Chronic pain of right knee  Stiffness of right knee, not elsewhere classified  Muscle weakness (generalized)  Difficulty in walking, not elsewhere  classified  Localized edema  Rationale for Evaluation and Treatment: Rehabilitation  ONSET DATE: 01/29/2024 surgery  SUBJECTIVE:   SUBJECTIVE STATEMENT: Pt reported same old annoying stiffness/soreness but nothing specific pain wise.   PERTINENT HISTORY: PMH includes anemia, asthma, DM, HTN and Lt TKA 2024.  PAIN:  NPRS scale: no specific pain  Pain location: Rt knee  Pain description: sore/achy Aggravating factors: static positioning, end ranges, WB pressure.  Relieving factors: medicine, ice  PRECAUTIONS: None  WEIGHT BEARING RESTRICTIONS: No  FALLS:  Has patient fallen in last 6 months? No  LIVING ENVIRONMENT: Lives with: girlfriend Lives in: House/apartment Stairs: flight to bedroom, stairs to enter house with handrail Has following equipment at home: [facility], FWW  OCCUPATION: [employer].  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  Waist to waist lifting.   PLOF: Independent, walking for exercise, previous bike riding.  [employer]  PATIENT GOALS: Reduce pain, get back to work and activity.   OBJECTIVE:   PATIENT SURVEYS:  Patient-Specific Activity Scoring Scheme  0 represents "unable to perform." 10 represents "able to perform at prior level. 0 1 2 3 4 5 6 7 8 9  10 (Date and Score)   Activity Eval  02/20/2024 03/12/24  03/28/2024  1. Stairs  2  3  7   2. standing 4 6  7   3.  showering 4 4 10   4.  Prolonged static positioning (sleeping, sitting) 2 3 7   5.     Score 3 avg 4 7.75 avg   Total score = sum of the activity scores/number of activities Minimum detectable change (90%CI) for average score = 2 points Minimum detectable change (90%CI) for single activity score = 3 points  COGNITION: 02/20/2024 Overall cognitive status: WFL    SENSATION: 02/20/2024 Not tested  EDEMA:  02/20/2024 Localized edema noted in Rt knee/lower leg   MUSCLE LENGTH: 02/20/2024 No specific testing  POSTURE:  02/20/2024 Nothing specific noted impacting Rt  knee other than weight shift towards Lt in standing.   PALPATION: 02/20/2024 Mild tenderness anterior knee joint, incision area.   LOWER EXTREMITY ROM:   ROM Right Eval 02/20/2024 Right 02/29/24 Right 03/06/24 Rt 03/12/24 Right 04/04/2024  Hip flexion       Hip extension       Hip abduction       Hip adduction       Hip internal rotation       Hip external rotation       Knee flexion 109 AROM in supine heel slide  106 in supine AROM heel slide Seated P: 113* A: 107* Seated A: 108 P: 112 Supine:  A:    Knee extension -31 AROM in seated LAQ  Supine heel prop: -11  -6 in supine quad set LAQ -7* Supine: A: -7 P; -5  -4 TKE stretch   Ankle dorsiflexion       Ankle plantarflexion       Ankle inversion       Ankle eversion        (Blank rows = not tested)  LOWER EXTREMITY MMT:  MMT Right Eval 02/20/2024 Left Eval 02/20/2024 Right 03/21/2024 Left 03/21/2024 Right 03/28/2024 Right 04/04/2024  Hip flexion 5/5 5/5      Hip extension        Hip abduction        Hip adduction        Hip internal rotation        Hip external rotation        Knee flexion 4/5 5/5      Knee extension 3+/5 5/5 4/5 27.1, 25 lbs 5/5 60, 65.8 lbs 4/5 32, 30 lbs 5/5 38 lbs  Ankle dorsiflexion 5/5 5/5      Ankle plantarflexion        Ankle inversion        Ankle eversion         (Blank rows = not tested)  LOWER EXTREMITY SPECIAL TESTS:  02/20/2024 No specific testing today  FUNCTIONAL TESTS:  03/26/2024: 18 inch chair transfer: s UE assist on 1st try TUG independent. 11.6 seconds  02/20/2024 18 inch chair transfer: able to perform without UE assist after several tries with deviation to Lt LE Lt SLS: 10 seconds Rt SLS: unable unassisted.   GAIT: 03/26/2024:  Ambulation independent in clinic, arrived with [facility] but did not use on every step.   02/20/2024 Distance walked: household distances  Assistive device utilized: SPC use in Lt UE Level of assistance: Modified independence Comments: reduced  stance on Rt leg, maintained knee flexion  TODAY'S TREATMENT                                                                          DATE: 04/04/2024 Therex: UBE LE only seat 11 10 mins lvl 3.5  Long duration stretching education for TKE stretch.  Performed in clinic with 4 lb weight to replicate home weight.  Performed to tolerance  Seated tailgate flexion swinging 1 min  Seated quad set with SLR Rt leg 2 x 10   Review of plan for HEP and gym based activity for continued gains.  Time spent in education.   TODAY'S TREATMENT                                                                          DATE: 04/02/2024 Therex: UBE LE only seat 11 10 mins lvl 3.5  Machine knee extension eccentric only Rt leg 5 lbs 3 x 15 with slow lowering focus Machine hamstring curl Rt leg 10 lbs 2 x 15  Additional time spent for slow control movement focus.   Neuro Re-ed (balance improvement, neuromuscular coordination/recruitment) SLS on airex foam with contralateral leg touching 3 cones semicircle anteriorly with occasional HHA x 8 each bilateral Tandem stance on foam 1 min x 2 bilateral with occasional HHA    TODAY'S TREATMENT                                                                          DATE: 03/28/2024 Therex: Recumbent bike 4 10 mins , seat 7 Incline gastroc stretch 30 sec x 5 bilateral  Machine leg extension double leg up, single leg lowering Rt 10 lbs x 10 5 lbs 2 x 15  - additional time for slow control movement focus for optimal strength gains.    TherActivity (improve stairs, transfers, squatting) Lateral step down 6 inch step WB on Rt leg c focus on slow lowering x15 Flight of stairs in clinic with reciprocal gait pattern with single rail assist up/down.  Time spend in education about home activity use for stair  navigation improvements.     PATIENT EDUCATION:  04/04/2024 Education details: HEP, POC Person educated: Patient Education method: Explanation, Demonstration, Verbal cues, and Handouts Education comprehension: verbalized understanding, returned demonstration, and verbal cues required  HOME EXERCISE PROGRAM: Access Code: [email or web address] URL: [email or web address] Date: 04/04/2024 Prepared by: Ozell Silvan  Exercises - Supine Heel Slide  - 2-3 x daily - 7 x weekly - 1 sets - 10 reps - 5 hold - Seated Long Arc Quad  - 2-3 x daily - 7 x weekly - 1 sets - 5-10 reps - 2 hold - Seated Knee Extension Stretch with Chair  - 2-3 x  daily - 7 x weekly - 1 sets - 1 reps - 3-5 mins hold - Seated Quad Set  - 2-3 x daily - 7 x weekly - 1 sets - 10 reps - 5 hold - Seated Calf Stretch with Strap  - 2-3 x daily - 7 x weekly - 1 sets - 3-5 reps - 30 hold - Gastroc Stretch on Step  - 1-3 x daily - 7 x weekly - 1 sets - 3 reps - 30 seconds hold - Supine Quadriceps Stretch with Strap on Table  - 1-3 x daily - 7 x weekly - 1 sets - 3 reps - 30 seconds hold - Tandem Stance  - 1 x daily - 7 x weekly - 3 sets - 2 reps - 30 seconds hold - Seated Straight Leg Heel Taps  - 1-2 x daily - 7 x weekly - 2-3 sets - 10-15 reps  ASSESSMENT:  CLINICAL IMPRESSION: At this time, Pt was proficient in HEP and reported feeling comfortable with plan for trial HEP at this time to progress continued. Reviewed HEP while in clinic today, specifically extension stretching to continue to gain TKE to 0.  Pt was in agreement with plan.    OBJECTIVE IMPAIRMENTS: Abnormal gait, decreased activity tolerance, decreased balance, decreased coordination, decreased endurance, decreased mobility, difficulty walking, decreased ROM, decreased strength, hypomobility, increased edema, increased fascial restrictions, impaired perceived functional ability, increased muscle spasms, impaired flexibility, improper body mechanics, and pain.    ACTIVITY LIMITATIONS: lifting, bending, sitting, standing, squatting, sleeping, stairs, transfers, bed mobility, and locomotion level  PARTICIPATION LIMITATIONS: meal prep, cleaning, laundry, interpersonal relationship, driving, shopping, community activity, and occupation  PERSONAL FACTORS:  PMH includes anemia, asthma, DM, HTN and Lt TKA 2024. are also affecting patient's functional outcome.   REHAB POTENTIAL: Good  CLINICAL DECISION MAKING: Evolving/moderate complexity  EVALUATION COMPLEXITY: Moderate   GOALS: Goals reviewed with patient? Yes  SHORT TERM GOALS: (target date for Short term goals are 3 weeks 03/12/2024)   1.  Patient will demonstrate independent use of home exercise program to maintain progress from in clinic treatments.  Goal status: Met  LONG TERM GOALS: (target dates for all long term goals are 10 weeks  04/30/2024 )   1. Patient will demonstrate/report pain at worst less than or equal to 2/10 to facilitate minimal limitation in daily activity secondary to pain symptoms.  Goal status: partially met 04/04/2024   2. Patient will demonstrate independent use of home exercise program to facilitate ability to maintain/progress functional gains from skilled physical therapy services.  Goal status: met 04/04/2024   3. Patient will demonstrate Patient specific functional scale avg > or = 8/10 to indicate reduced disability due to condition.   Goal status: mostly  met 04/04/2024   4.  Patient will demonstrate Rt LE MMT 5/5 throughout to faciltiate usual transfers, stairs, squatting at [facility] for daily life.   Goal status: met 04/04/2024   5.  Patient will demonstrate Rt knee AROM 0-110 deg to facilitate transfers and other daily mobility at PLOF.  Goal status: mostly met 04/04/2024   6.  Patient will demonstrate independent ambulation > 500 ft s deviation for community integration. Goal status: met 04/04/2024   7.  Patient will demonstrate ascending/descending  stairs reciprocally s UE assist for community integration.   Goal Status: met 04/04/2024   PLAN:  PT FREQUENCY: 1-2x/week  PT DURATION: 10 weeks  PLANNED INTERVENTIONS: Can include 02853- PT Re-evaluation, 97110-Therapeutic exercises, 97530- Therapeutic activity, 97112- Neuromuscular re-education,  02464- Self Care, 02859- Manual therapy, [id number]- Gait training, [phone]- Orthotic Fit/training, [phone]- Canalith repositioning, [id number]- Aquatic Therapy, [phone]- Electrical stimulation (unattended), [phone]- Electrical stimulation (manual), [id number] Physical performance testing, [id number]- Vasopneumatic device, [id number]- Ultrasound, [id number]- Traction (mechanical), [phone]- Ionotophoresis 4mg /ml Dexamethasone , Patient/Family education, Balance training, Stair training, Taping, Dry Needling, Joint mobilization, Joint manipulation, Spinal manipulation, Spinal mobilization, Scar mobilization, Vestibular training, Visual/preceptual remediation/compensation, DME instructions, Cryotherapy, and Moist heat.  All performed as medically necessary.  All included unless contraindicated  PLAN FOR NEXT SESSION: trial  HEP   Ozell Silvan, PT, DPT, OCS, ATC 04/04/24  3:49 PM   PHYSICAL THERAPY DISCHARGE SUMMARY  Visits from Start of Care: 14  Current functional level related to goals / functional outcomes: See note   Remaining deficits: See note   Education / Equipment: HEP  Patient goals were met. Patient is being discharged due to not returning since the last visit.  Ozell Silvan, PT, DPT, OCS, ATC 07/01/24  8:34 AM

## 2024-04-22 ENCOUNTER — Telehealth: Payer: Self-pay | Admitting: Orthopaedic Surgery

## 2024-04-22 NOTE — Telephone Encounter (Signed)
 Received vm from patient about rtw fom. IC,lmvm advised that at his appt. To drop off his form at the front desk with his $20 form fee payment and sign auth for [facility].

## 2024-04-23 ENCOUNTER — Other Ambulatory Visit (INDEPENDENT_AMBULATORY_CARE_PROVIDER_SITE_OTHER): Payer: Self-pay

## 2024-04-23 ENCOUNTER — Ambulatory Visit (INDEPENDENT_AMBULATORY_CARE_PROVIDER_SITE_OTHER): Admitting: Physician Assistant

## 2024-04-23 DIAGNOSIS — Z96651 Presence of right artificial knee joint: Secondary | ICD-10-CM

## 2024-04-23 DIAGNOSIS — M25571 Pain in right ankle and joints of right foot: Secondary | ICD-10-CM | POA: Diagnosis not present

## 2024-04-23 MED ORDER — TRAMADOL HCL 50 MG PO TABS: 50.0000 mg | ORAL_TABLET | Freq: Two times a day (BID) | ORAL | 2 refills | Status: DC | PRN

## 2024-04-23 MED ORDER — METHOCARBAMOL 500 MG PO TABS: 500.0000 mg | ORAL_TABLET | Freq: Two times a day (BID) | ORAL | 1 refills | Status: DC | PRN

## 2024-04-23 NOTE — Progress Notes (Signed)
 Post-Op Visit Note   Patient: Craig House           Date of Birth: Dec 22, 1960           MRN: [id number] Visit Date: 04/23/2024 PCP: Sylvia Everts, PA-C   Assessment & Plan:  Chief Complaint:  Chief Complaint  Patient presents with   Right Knee - Follow-up    post right total knee replacement 01/29/2024   Visit Diagnoses:  1. Status post total right knee replacement   2. Pain in right ankle and joints of right foot     Plan: Patient is a pleasant 63 year old gentleman who comes in today 3 months status post right total knee replacement 01/29/2024.  He has been doing relatively well in regards to the right knee.  He still has more stiffness than he had hoped for at this point in time.  He has been taking Robaxin  and Norco primarily at night but sometimes during the day to help with pain and stiffness.  He has finished formal physical therapy but continues to work on a home exercise program.  He continues to have some pain to the medial ankle which has slightly improved.  This primarily occurs when he is walking as well as at night when he is trying to sleep.  He denies having had an injury but notes that this started around the time of the surgery.  Examination of the right knee reveals range of motion: Approximately 10 degree soft flexion contracture.  Approximately 125 degrees of flexion.  He stable to valgus varus stress.  Neurovascularly intact distally.  Right ankle exam: Mild tenderness to the distal tibia.  No pain to the posterior tibial or peroneal tendons.  No tenderness to the anterior ankle.  No pain with range of motion.  He is neurovascularly intact distally.  Regards to the right knee, he will continue to work on range of motion, specifically extension.  Dental prophylaxis reinforced.  Follow-up in 3 weeks for repeat evaluation and 2 view x-rays of the right knee.  In regards to the right ankle, I do not see any evidence of fracture or any other structural abnormalities  on x-ray.  I believe his pain may be from walking with an altered gait postop.  I am hopeful that this will improve over time.  Follow-Up Instructions: Return in about 3 months (around 07/24/2024).   Orders:  Orders Placed This Encounter  Procedures   XR Ankle Complete Right   Meds ordered this encounter  Medications   traMADol  (ULTRAM ) 50 MG tablet    Sig: Take 1-2 tablets (50-100 mg total) by mouth every 12 (twelve) hours as needed.    Dispense:  30 tablet    Refill:  2   methocarbamol  (ROBAXIN ) 500 MG tablet    Sig: Take 1 tablet (500 mg total) by mouth 2 (two) times daily as needed.    Dispense:  20 tablet    Refill:  1    Imaging: No new imaging  PMFS History: Patient Active Problem List   Diagnosis Date Noted   Status post total right knee replacement 01/29/2024   Primary osteoarthritis of right knee 12/26/2023   Status post total left knee replacement 01/09/2023   Bilateral primary osteoarthritis of knee 06/19/2018   Mild intermittent asthma, uncomplicated 01/18/2016   HTN (hypertension) 12/31/2011   Past Medical History:  Diagnosis Date   Allergy    spring   Anemia    Arthritis    Asthma  Diabetes mellitus without complication (HCC)    type II   Hyperplastic colon polyp    Hypertension    Obesity     Family History  Problem Relation Age of Onset   Heart attack Mother    Cancer Father    Heart disease Father    Colon cancer Neg Hx    Colon polyps Neg Hx    Esophageal cancer Neg Hx    Prostate cancer Neg Hx    Rectal cancer Neg Hx     Past Surgical History:  Procedure Laterality Date   KNEE ARTHROSCOPY Bilateral    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   Procedure: LEFT TOTAL KNEE ARTHROPLASTY;  Surgeon: Wes Hamman, MD;  Location: [facility] OR;  Service: Orthopedics;  Laterality: Left;   TOTAL KNEE ARTHROPLASTY Right 01/29/2024   Procedure: RIGHT TOTAL KNEE ARTHROPLASTY;  Surgeon: Wes Hamman, MD;  Location: [facility] OR;  Service: Orthopedics;   Laterality: Right;   Social History   Occupational History   Not on file  Tobacco Use   Smoking status: Former   Smokeless tobacco: Never  Vaping Use   Vaping status: Never Used  Substance and Sexual Activity   Alcohol use: Yes    Alcohol/week: 1.0 standard drink of alcohol    Types: 1 Standard drinks or equivalent per week    Comment: 1 glass of red wine at night   Drug use: No   Sexual activity: Yes

## 2024-05-03 ENCOUNTER — Ambulatory Visit (INDEPENDENT_AMBULATORY_CARE_PROVIDER_SITE_OTHER): Admitting: Medical

## 2024-05-03 VITALS — BP 138/90 | HR 83 | Temp 98.0°F | Resp 18 | Ht 67.0 in | Wt 186.4 lb

## 2024-05-03 DIAGNOSIS — Z7984 Long term (current) use of oral hypoglycemic drugs: Secondary | ICD-10-CM

## 2024-05-03 DIAGNOSIS — T7840XA Allergy, unspecified, initial encounter: Secondary | ICD-10-CM | POA: Diagnosis not present

## 2024-05-03 DIAGNOSIS — L739 Follicular disorder, unspecified: Secondary | ICD-10-CM

## 2024-05-03 DIAGNOSIS — Z23 Encounter for immunization: Secondary | ICD-10-CM

## 2024-05-03 DIAGNOSIS — E119 Type 2 diabetes mellitus without complications: Secondary | ICD-10-CM

## 2024-05-03 MED ORDER — AMLODIPINE BESYLATE 10 MG PO TABS: ORAL_TABLET | ORAL | 1 refills | Status: DC

## 2024-05-03 MED ORDER — METHYLPREDNISOLONE 4 MG PO TBPK
ORAL_TABLET | ORAL | 0 refills | Status: DC
Start: 2024-05-03 — End: 2024-05-13

## 2024-05-03 MED ORDER — DOXYCYCLINE HYCLATE 100 MG PO TABS: 100.0000 mg | ORAL_TABLET | Freq: Two times a day (BID) | ORAL | 0 refills | Status: DC

## 2024-05-03 MED ORDER — OZEMPIC (0.25 OR 0.5 MG/DOSE) 2 MG/3ML ~~LOC~~ SOPN: 0.5000 mg | PEN_INJECTOR | SUBCUTANEOUS | 3 refills | Status: DC

## 2024-05-03 MED ORDER — HYDROXYZINE HCL 10 MG PO TABS: 10.0000 mg | ORAL_TABLET | Freq: Three times a day (TID) | ORAL | 0 refills | Status: DC | PRN

## 2024-05-03 NOTE — Patient Instructions (Addendum)
 Allergic Contact Dermatitis with Secondary Folliculitis Symmetric pruritic rash with secondary folliculitis due to scratching. Possible external allergen contact. Inflamed follicles suggest potential skin infection. - Prescribed doxycycline  for potential skin infection and folliculitis, advised administration with food, cautioned about photosensitivity. - Prescribed Medrol  dose pack for allergic reaction, monitored blood glucose due to hyperglycemia risk. - Prescribed antihistamine for pruritus, 10 mg every 8 hours as needed, option to double dose at night. - Advised follow-up in 7-10 days to assess improvement.  Type 2 Diabetes Mellitus Diabetes management affected by recent knee surgery and medication adjustments. A1c was 7.5% in February. Morning blood glucose levels in the 150s mg/dL. Resumed Ozempic  post-surgery, continues metformin . Concern for hyperglycemia due to Medrol . - Ordered A1c and metabolic panel to evaluate current diabetes control. - Continued Ozempic  and metformin  for diabetes management. - Utilized sliding scale insulin  for blood glucose exceeding 200 mg/dL, particularly during Medrol  therapy.  Htn- Relatively well conrrolled. -refilled amlodipine  today  Follow up 7-10 days or sooner if needed

## 2024-05-03 NOTE — Progress Notes (Signed)
 Subjective:    Patient ID: Craig House, male    DOB: 1961/01/29, 63 y.o.   MRN: [id number]  HPI  Craig House is a 63 year old male with diabetes who presents with a spreading rash and itching.  He has a rash that began on his forearms approximately two weeks ago and has since spread to his inner legs and genitals. The rash is described as having 'hard spots' and 'bumps' that itch, particularly at night. It has previously appeared on his face, eyes, and back. He has been careful to avoid contact with potential allergens, wearing protective clothing while gardening and washing immediately after exposure.  He has a history of diabetes, with a recent A1c of 7.5 as of February 28th. He underwent knee surgery in March and was off Ozempic  before and after the surgery, resuming it two weeks ago. His blood sugar levels have been fluctuating, with morning readings typically in the 150s, though he has not recorded any levels over 200 recently. He is currently taking metformin  and Ozempic .  He mentions a previous allergic reaction around the same time last year, which was extensive and involved his ears, hands, and mouth. He has been cautious to avoid similar exposures this time, using gloves and protective clothing while outdoors. No new soaps, creams, or detergents have been used.  He is experiencing significant itching, particularly in the genital area, which disrupts his sleep. He recalls being prescribed medication for itching in the past, which he found helpful.   Pt mentions various meds that he used post surgery though on review I don't think med cause of rash.      Review of Systems  Constitutional:  Negative for chills, fatigue and fever.  HENT:  Negative for congestion.   Respiratory:  Negative for cough, chest tightness and wheezing.   Cardiovascular:  Negative for chest pain and palpitations.  Gastrointestinal:  Negative for abdominal pain, blood in stool,  constipation and vomiting.  Endocrine: Negative for polydipsia, polyphagia and polyuria.  Genitourinary:  Negative for dysuria, flank pain and frequency.  Musculoskeletal:  Negative for back pain and joint swelling.  Skin:  Positive for rash.       With itching.  Neurological:  Negative for facial asymmetry, light-headedness and numbness.  Hematological:  Negative for adenopathy.  Psychiatric/Behavioral:  Negative for behavioral problems.     Past Medical History:  Diagnosis Date   Allergy    spring   Anemia    Arthritis    Asthma    Diabetes mellitus without complication (HCC)    type II   Hyperplastic colon polyp    Hypertension    Obesity      Social History   Socioeconomic History   Marital status: Single    Spouse name: Not on file   Number of children: Not on file   Years of education: Not on file   Highest education level: Associate degree: occupational, Scientist, product/process development, or vocational program  Occupational History   Not on file  Tobacco Use   Smoking status: Former   Smokeless tobacco: Never  Vaping Use   Vaping status: Never Used  Substance and Sexual Activity   Alcohol use: Yes    Alcohol/week: 1.0 standard drink of alcohol    Types: 1 Standard drinks or equivalent per week    Comment: 1 glass of red wine at night   Drug use: No   Sexual activity: Yes  Other Topics Concern   Not on file  Social History Narrative   Not on file   Social Drivers of Health   Financial Resource Strain: Low Risk  (05/03/2024)   Overall Financial Resource Strain (CARDIA)    Difficulty of Paying Living Expenses: Not very hard  Food Insecurity: Food Insecurity Present (05/03/2024)   Hunger Vital Sign    Worried About Running Out of Food in the Last Year: Sometimes true    Ran Out of Food in the Last Year: Sometimes true  Transportation Needs: No Transportation Needs (05/03/2024)   PRAPARE - Administrator, Civil Service (Medical): No    Lack of Transportation  (Non-Medical): No  Physical Activity: Sufficiently Active (05/03/2024)   Exercise Vital Sign    Days of Exercise per Week: 3 days    Minutes of Exercise per Session: 60 min  Stress: No Stress Concern Present (05/03/2024)   [employer] of Occupational Health - Occupational Stress Questionnaire    Feeling of Stress: Only a little  Social Connections: Moderately Isolated (05/03/2024)   Social Connection and Isolation Panel    Frequency of Communication with Friends and Family: More than three times a week    Frequency of Social Gatherings with Friends and Family: Twice a week    Attends Religious Services: More than 4 times per year    Active Member of [employer] or Organizations: No    Attends Banker Meetings: Not on file    Marital Status: Divorced  Intimate Partner Violence: Not At Risk (01/09/2023)   Humiliation, Afraid, Rape, and Kick questionnaire    Fear of Current or Ex-Partner: No    Emotionally Abused: No    Physically Abused: No    Sexually Abused: No    Past Surgical History:  Procedure Laterality Date   KNEE ARTHROSCOPY Bilateral    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   Procedure: LEFT TOTAL KNEE ARTHROPLASTY;  Surgeon: Jerri Kay HERO, MD;  Location: [facility] OR;  Service: Orthopedics;  Laterality: Left;   TOTAL KNEE ARTHROPLASTY Right 01/29/2024   Procedure: RIGHT TOTAL KNEE ARTHROPLASTY;  Surgeon: Jerri Kay HERO, MD;  Location: [facility] OR;  Service: Orthopedics;  Laterality: Right;    Family History  Problem Relation Age of Onset   Heart attack Mother    Cancer Father    Heart disease Father    Colon cancer Neg Hx    Colon polyps Neg Hx    Esophageal cancer Neg Hx    Prostate cancer Neg Hx    Rectal cancer Neg Hx     No Known Allergies  Current Outpatient Medications on File Prior to Visit  Medication Sig Dispense Refill   albuterol  (VENTOLIN  HFA) 108 (90 Base) MCG/ACT inhaler INHALE 2 PUFFS BY MOUTH EVERY 6 HOURS AS NEEDED FOR WHEEZING OR SHORTNESS OF  BREATH (Patient taking differently: Inhale 3 puffs into the lungs as needed for wheezing or shortness of breath.) 8.5 g 0   amLODipine  (NORVASC ) 10 MG tablet TAKE 1 TABLET BY MOUTH EVERY DAY 90 tablet 1   apixaban  (ELIQUIS ) 2.5 MG TABS tablet Take 1 tablet (2.5 mg total) by mouth 2 (two) times daily. To be taken after surgery to prevent blood clots (Patient not taking: Reported on 03/19/2024) 60 tablet 0   atorvastatin  (LIPITOR) 20 MG tablet TAKE 1 TABLET BY MOUTH EVERY DAY 90 tablet 1   Brimonidine Tartrate (LUMIFY) 0.025 % SOLN Place 1 drop into both eyes daily as needed (redness).     Cholecalciferol (DIALYVITE VITAMIN D 5000) 125 MCG (  5000 UT) capsule Take 5,000 Units by mouth every other day.     Cinnamon 500 MG TABS Take 500 mg by mouth daily.     docusate sodium  (COLACE) 100 MG capsule Take 1 capsule (100 mg total) by mouth daily as needed. 30 capsule 2   HYDROcodone -acetaminophen  (NORCO/VICODIN) 5-325 MG tablet Take 1-2 tablets by mouth 2 (two) times daily as needed for moderate pain (pain score 4-6). 30 tablet 0   insulin  lispro (HUMALOG  KWIKPEN) 100 UNIT/ML KwikPen Inject 4 Units into the skin 3 (three) times daily. (Patient taking differently: Inject 4 Units into the skin as needed (high bs).) 15 mL 3   Iron-Vitamin C 65-125 MG TABS Take 1 tablet by mouth daily.     metFORMIN  (GLUCOPHAGE ) 500 MG tablet TAKE 1 TABLET(500 MG) BY MOUTH TWICE DAILY WITH A MEAL 180 tablet 3   methocarbamol  (ROBAXIN ) 500 MG tablet Take 1 tablet (500 mg total) by mouth 2 (two) times daily as needed. 20 tablet 1   methocarbamol  (ROBAXIN -750) 750 MG tablet Take 1 tablet (750 mg total) by mouth 2 (two) times daily as needed for muscle spasms. To be taken after surgery 20 tablet 2   Multiple Vitamin (MULTI-VITAMINS) TABS Take 1 tablet by mouth daily.     ondansetron  (ZOFRAN ) 4 MG tablet Take 1 tablet (4 mg total) by mouth every 8 (eight) hours as needed for nausea or vomiting. (Patient not taking: Reported on 01/24/2024)  40 tablet 0   oxyCODONE -acetaminophen  (PERCOCET) 5-325 MG tablet Take 1-2 tablets by mouth every 8 (eight) hours as needed. To be taken after surgery (Patient not taking: Reported on 03/19/2024) 40 tablet 0   Semaglutide ,0.25 or 0.5MG /DOS, (OZEMPIC , 0.25 OR 0.5 MG/DOSE,) 2 MG/3ML SOPN Inject 0.5 mg into the skin once a week. (Patient not taking: Reported on 03/06/2024) 3 mL 3   traMADol  (ULTRAM ) 50 MG tablet Take 1-2 tablets (50-100 mg total) by mouth every 12 (twelve) hours as needed. 30 tablet 2   triamcinolone  cream (KENALOG ) 0.1 % Apply 1 Application topically 2 (two) times daily. (Patient not taking: Reported on 03/06/2024) 30 g 0   triamterene -hydrochlorothiazide  (DYAZIDE ) 37.5-25 MG capsule Take 1 each (1 capsule total) by mouth every morning. 90 capsule 3   No current facility-administered medications on file prior to visit.    BP (!) 138/90   Pulse 83   Temp 98 F (36.7 C)   Resp 18   Ht 5' 7 (1.702 m)   Wt 186 lb 6.4 oz (84.6 kg)   SpO2 95%   BMI 29.19 kg/m        Objective:   Physical Exam  General Mental Status- Alert. General Appearance- Not in acute distress.   Skin Scatterd diffuse faint rash with scattered inflammed follicles arms, legs and hands. No induration or tenderenss.  Neck Carotid Arteries- Normal color. Moisture- Normal Moisture. No carotid bruits. No JVD.  Chest and Lung Exam Auscultation: Breath Sounds:-Normal.  Cardiovascular Auscultation:Rythm- Regular. Murmurs & Other Heart Sounds:Auscultation of the heart reveals- No Murmurs.  Abdomen Inspection:-Inspeection Normal. Palpation/Percussion:Note:No mass. Palpation and Percussion of the abdomen reveal- Non Tender, Non Distended + BS, no rebound or guarding.   Neurologic Cranial Nerve exam:- CN III-XII intact(No nystagmus), symmetric smile. Strength:- 5/5 equal and symmetric strength both upper and lower extremities.       Assessment & Plan:   Patient Instructions  Allergic Contact  Dermatitis with Secondary Folliculitis Symmetric pruritic rash with secondary folliculitis due to scratching. Possible external allergen contact. Inflamed  follicles suggest potential skin infection. - Prescribed doxycycline  for potential skin infection and folliculitis, advised administration with food, cautioned about photosensitivity. - Prescribed Medrol  dose pack for allergic reaction, monitored blood glucose due to hyperglycemia risk. - Prescribed antihistamine for pruritus, 10 mg every 8 hours as needed, option to double dose at night. - Advised follow-up in 7-10 days to assess improvement.  Type 2 Diabetes Mellitus Diabetes management affected by recent knee surgery and medication adjustments. A1c was 7.5% in February. Morning blood glucose levels in the 150s mg/dL. Resumed Ozempic  post-surgery, continues metformin . Concern for hyperglycemia due to Medrol . - Ordered A1c and metabolic panel to evaluate current diabetes control. - Continued Ozempic  and metformin  for diabetes management. - Utilized sliding scale insulin  for blood glucose exceeding 200 mg/dL, particularly during Medrol  therapy.  Follow up 7-10 days or sooner if needed   Time spent with patient today was 40  minutes which consisted of chart revdiew, discussing diagnosis, work up treatment and documentation.

## 2024-05-04 ENCOUNTER — Ambulatory Visit: Payer: Self-pay | Admitting: Medical

## 2024-05-04 LAB — COMPREHENSIVE METABOLIC PANEL WITH GFR
AG Ratio: 1.4 (calc) (ref 1.0–2.5)
ALT: 24 U/L (ref 9–46)
AST: 21 U/L (ref 10–35)
Albumin: 4.7 g/dL (ref 3.6–5.1)
Alkaline phosphatase (APISO): 54 U/L (ref 35–144)
BUN: 18 mg/dL (ref 7–25)
CO2: 27 mmol/L (ref 20–32)
Calcium: 9.9 mg/dL (ref 8.6–10.3)
Chloride: 100 mmol/L (ref 98–110)
Creat: 1.25 mg/dL (ref 0.70–1.35)
Globulin: 3.3 g/dL (ref 1.9–3.7)
Glucose, Bld: 92 mg/dL (ref 65–99)
Potassium: 4.2 mmol/L (ref 3.5–5.3)
Sodium: 138 mmol/L (ref 135–146)
Total Bilirubin: 0.5 mg/dL (ref 0.2–1.2)
Total Protein: 8 g/dL (ref 6.1–8.1)
eGFR: 64 mL/min/{1.73_m2} (ref >=60)

## 2024-05-04 LAB — HEMOGLOBIN A1C
Hgb A1c MFr Bld: 6.3 % — ABNORMAL HIGH (ref <5.7)
Mean Plasma Glucose: 134 mg/dL
eAG (mmol/L): 7.4 mmol/L

## 2024-05-13 ENCOUNTER — Ambulatory Visit (INDEPENDENT_AMBULATORY_CARE_PROVIDER_SITE_OTHER): Admitting: Medical

## 2024-05-13 VITALS — BP 119/73 | HR 90 | Temp 98.1°F | Resp 12 | Ht 67.0 in | Wt 186.4 lb

## 2024-05-13 DIAGNOSIS — Z113 Encounter for screening for infections with a predominantly sexual mode of transmission: Secondary | ICD-10-CM

## 2024-05-13 DIAGNOSIS — L739 Follicular disorder, unspecified: Secondary | ICD-10-CM

## 2024-05-13 DIAGNOSIS — R21 Rash and other nonspecific skin eruption: Secondary | ICD-10-CM

## 2024-05-13 MED ORDER — METHYLPREDNISOLONE 4 MG PO TABS: 4.0000 mg | ORAL_TABLET | Freq: Every day | ORAL | 0 refills | Status: DC

## 2024-05-13 MED ORDER — FLUCONAZOLE 150 MG PO TABS: ORAL_TABLET | ORAL | 0 refills | Status: DC

## 2024-05-13 MED ORDER — DOXYCYCLINE HYCLATE 100 MG PO TABS: 100.0000 mg | ORAL_TABLET | Freq: Two times a day (BID) | ORAL | 0 refills | Status: DC

## 2024-05-13 NOTE — Progress Notes (Signed)
 Subjective:    Patient ID: Craig House, male    DOB: December 20, 1960, 63 y.o.   MRN: [id number]  HPI Last visit seen for below. See last AVS in   Allergic Contact Dermatitis with Secondary Folliculitis Symmetric pruritic rash with secondary folliculitis due to scratching. Possible external allergen contact. Inflamed follicles suggest potential skin infection. - Prescribed doxycycline  for potential skin infection and folliculitis, advised administration with food, cautioned about photosensitivity. - Prescribed Medrol  dose pack for allergic reaction, monitored blood glucose due to hyperglycemia risk. - Prescribed antihistamine for pruritus, 10 mg every 8 hours as needed, option to double dose at night. - Advised follow-up in 7-10 days to assess improvement.   Today present  with  hyperpigmented areas on his  forearm, palms, legs and groin, with itching primarily in the groin area.  treated with doxycycline  for seven days and a Medrol  taper for six days, which resulted in some improvement but not complete resolution.   He  had triamcinolone  cream, which was previously prescribed by a dermatologist, but notes that the instructions advise against applying it to the face or groin. He continues to use triamcinolone  between his fingers twice daily. He uses hydroxyzine  for itching, primarily at night, as it causes drowsiness. He reports itching in the groin area and confirms the presence of bumps at the base of the penis. No recent exposure that could have caused the rash.  He has a history of diabetes managed with metformin . Recent blood sugar readings are 99, 144, and 102. He manages his diabetes primarily through diet and metformin , without the need for insulin . Occasionally, his morning blood sugar levels reach the 160s.  He recalls mentione conversation about syphilis, but clarifies it was in reference to a coworker with shingles. He doubts having syphilis himself. He also states no partners  other than is wife having been married for years thus extremely unlikley per his desciption.       Review of Systems  Constitutional:  Negative for chills, fatigue and fever.  Respiratory:  Negative for cough, chest tightness, shortness of breath and wheezing.   Cardiovascular:  Negative for chest pain and palpitations.  Gastrointestinal:  Negative for abdominal pain.  Genitourinary:  Negative for dysuria, frequency and penile pain.  Musculoskeletal:  Negative for back pain and neck pain.  Skin:  Positive for rash.       folliculitis  Neurological:  Negative for dizziness, syncope, weakness and numbness.  Hematological:  Negative for adenopathy. Does not bruise/bleed easily.  Psychiatric/Behavioral:  Negative for behavioral problems, dysphoric mood and sleep disturbance. The patient is not nervous/anxious.     Past Medical History:  Diagnosis Date   Allergy    spring   Anemia    Arthritis    Asthma    Diabetes mellitus without complication (HCC)    type II   Hyperplastic colon polyp    Hypertension    Obesity      Social History   Socioeconomic History   Marital status: Single    Spouse name: Not on file   Number of children: Not on file   Years of education: Not on file   Highest education level: Associate degree: occupational, Scientist, product/process development, or vocational program  Occupational History   Not on file  Tobacco Use   Smoking status: Former   Smokeless tobacco: Never  Vaping Use   Vaping status: Never Used  Substance and Sexual Activity   Alcohol use: Yes    Alcohol/week: 1.0 standard drink  of alcohol    Types: 1 Standard drinks or equivalent per week    Comment: 1 glass of red wine at night   Drug use: No   Sexual activity: Yes  Other Topics Concern   Not on file  Social History Narrative   Not on file   Social Drivers of Health   Financial Resource Strain: Low Risk  (05/03/2024)   Overall Financial Resource Strain (CARDIA)    Difficulty of Paying Living  Expenses: Not very hard  Food Insecurity: Food Insecurity Present (05/03/2024)   Hunger Vital Sign    Worried About Running Out of Food in the Last Year: Sometimes true    Ran Out of Food in the Last Year: Sometimes true  Transportation Needs: No Transportation Needs (05/03/2024)   PRAPARE - Administrator, Civil Service (Medical): No    Lack of Transportation (Non-Medical): No  Physical Activity: Sufficiently Active (05/03/2024)   Exercise Vital Sign    Days of Exercise per Week: 3 days    Minutes of Exercise per Session: 60 min  Stress: No Stress Concern Present (05/03/2024)   [employer] of Occupational Health - Occupational Stress Questionnaire    Feeling of Stress: Only a little  Social Connections: Moderately Isolated (05/03/2024)   Social Connection and Isolation Panel    Frequency of Communication with Friends and Family: More than three times a week    Frequency of Social Gatherings with Friends and Family: Twice a week    Attends Religious Services: More than 4 times per year    Active Member of [employer] or Organizations: No    Attends Banker Meetings: Not on file    Marital Status: Divorced  Intimate Partner Violence: Not At Risk (01/09/2023)   Humiliation, Afraid, Rape, and Kick questionnaire    Fear of Current or Ex-Partner: No    Emotionally Abused: No    Physically Abused: No    Sexually Abused: No    Past Surgical History:  Procedure Laterality Date   KNEE ARTHROSCOPY Bilateral    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   Procedure: LEFT TOTAL KNEE ARTHROPLASTY;  Surgeon: Jerri Kay HERO, MD;  Location: [facility] OR;  Service: Orthopedics;  Laterality: Left;   TOTAL KNEE ARTHROPLASTY Right 01/29/2024   Procedure: RIGHT TOTAL KNEE ARTHROPLASTY;  Surgeon: Jerri Kay HERO, MD;  Location: [facility] OR;  Service: Orthopedics;  Laterality: Right;    Family History  Problem Relation Age of Onset   Heart attack Mother    Cancer Father    Heart disease Father     Colon cancer Neg Hx    Colon polyps Neg Hx    Esophageal cancer Neg Hx    Prostate cancer Neg Hx    Rectal cancer Neg Hx     No Known Allergies  Current Outpatient Medications on File Prior to Visit  Medication Sig Dispense Refill   albuterol  (VENTOLIN  HFA) 108 (90 Base) MCG/ACT inhaler INHALE 2 PUFFS BY MOUTH EVERY 6 HOURS AS NEEDED FOR WHEEZING OR SHORTNESS OF BREATH (Patient taking differently: Inhale 3 puffs into the lungs as needed for wheezing or shortness of breath.) 8.5 g 0   amLODipine  (NORVASC ) 10 MG tablet TAKE 1 TABLET BY MOUTH EVERY DAY 90 tablet 1   atorvastatin  (LIPITOR) 20 MG tablet TAKE 1 TABLET BY MOUTH EVERY DAY 90 tablet 1   Brimonidine Tartrate (LUMIFY) 0.025 % SOLN Place 1 drop into both eyes daily as needed (redness).     Cholecalciferol (  DIALYVITE VITAMIN D 5000) 125 MCG (5000 UT) capsule Take 5,000 Units by mouth every other day.     Cinnamon 500 MG TABS Take 500 mg by mouth daily.     hydrOXYzine  (ATARAX ) 10 MG tablet Take 1 tablet (10 mg total) by mouth 3 (three) times daily as needed for itching. 30 tablet 0   insulin  lispro (HUMALOG  KWIKPEN) 100 UNIT/ML KwikPen Inject 4 Units into the skin 3 (three) times daily. (Patient taking differently: Inject 4 Units into the skin as needed (high bs).) 15 mL 3   Iron-Vitamin C 65-125 MG TABS Take 1 tablet by mouth daily.     metFORMIN  (GLUCOPHAGE ) 500 MG tablet TAKE 1 TABLET(500 MG) BY MOUTH TWICE DAILY WITH A MEAL 180 tablet 3   Multiple Vitamin (MULTI-VITAMINS) TABS Take 1 tablet by mouth daily.     Semaglutide ,0.25 or 0.5MG /DOS, (OZEMPIC , 0.25 OR 0.5 MG/DOSE,) 2 MG/3ML SOPN Inject 0.5 mg into the skin once a week. 3 mL 3   triamterene -hydrochlorothiazide  (DYAZIDE ) 37.5-25 MG capsule Take 1 each (1 capsule total) by mouth every morning. 90 capsule 3   No current facility-administered medications on file prior to visit.    BP 119/73 (BP Location: Right Arm, Patient Position: Sitting, Cuff Size: Normal)   Pulse 90    Temp 98.1 F (36.7 C) (Oral)   Resp 12   Ht 5' 7 (1.702 m)   Wt 186 lb 6.4 oz (84.6 kg)   SpO2 97%   BMI 29.19 kg/m        Objective:   Physical Exam  General Mental Status- Alert. General Appearance- Not in acute distress.   Skin -Scatterd much less faint rash with much improved folliculitis of  arms, legs and hands. No induration or tenderenss. Some post inflammatory hyperpigmented areas mostly seen on ventral forearms.  Neck  No JVD.  Chest and Lung Exam Auscultation: Breath Sounds:-Normal.  Cardiovascular Auscultation:Rythm- Regular. Murmurs & Other Heart Sounds:Auscultation of the heart reveals- No Murmurs.  Abdomen Inspection:-Inspeection Normal. Palpation/Percussion:Note:No mass. Palpation and Percussion of the abdomen reveal- Non Tender, Non Distended + BS, no rebound or guarding.   Neurologic Cranial Nerve exam:- CN III-XII intact(No nystagmus), symmetric smile. Strength:- 5/5 equal and symmetric strength both upper and lower extremities.    Genital area- some macerated appearance of base of penis/groin area. At base of penis some appearance of follicutis. No broken down skn, no ulcers and no vesicles.    Assessment & Plan:   Patient Instructions  Allergic contact dermatitis with secondary folliculitis Persistent rash with improvement noted and some post inflammatory hyperpigmentaton. Continued treatment necessary for complete resolution. Considering additional doxycycline  and Medrol . Possible dermatologist referral if no improvement. - Prescribe doxycycline  for 7 days. - Prescribe Medrol  4 mg daily for 7 days. - Continue triamcinolone  cream twice daily between fingers. - Advise hydroxyzine  for itching, preferably at night. - Instruct to take pictures of rash for monitoring. - Refer to dermatologist if rash persists or worsens.  Groin rash with possible fungal infection Rash in groin area with maceration and raised areas. Differential includes fungal  infection, especially with diabetes. Diflucan prescribed. - Prescribe Diflucan, one tablet today and another on last day of antibiotic course. - Monitor groin area and report persistent symptoms.  Diabetes management Blood glucose levels variable, managed with diet and metformin . Medrol  may affect glucose control. Emphasized close monitoring during Medrol  treatment. - Continue monitoring blood glucose levels. - Adjust diabetes management as needed, especially during Medrol  treatment.  Follow up date  to be determined after my chart update in 7 days. Update sooner if needed.   Quantavious Eggert, PA-C

## 2024-05-13 NOTE — Patient Instructions (Addendum)
 Allergic contact dermatitis with secondary folliculitis Persistent rash with improvement noted and some post inflammatory hyperpigmentaton. Continued treatment necessary for complete resolution. Considering additional doxycycline  and Medrol . Possible dermatologist referral if no improvement. - Prescribe doxycycline  for 7 days. - Prescribe Medrol  4 mg daily for 7 days. - Continue triamcinolone  cream twice daily between fingers. - Advise hydroxyzine  for itching, preferably at night. - Instruct to take pictures of rash for monitoring. - Refer to dermatologist if rash persists or worsens.  Groin rash with possible fungal infection Rash in groin area with maceration and raised areas. Differential includes fungal infection, especially with diabetes. Diflucan prescribed. - Prescribe Diflucan, one tablet today and another on last day of antibiotic course. - Monitor groin area and report persistent symptoms.  Diabetes management Blood glucose levels variable, managed with diet and metformin . Medrol  may affect glucose control. Emphasized close monitoring during Medrol  treatment. - Continue monitoring blood glucose levels. - Adjust diabetes management as needed, especially during Medrol  treatment.  Follow up date to be determined after my chart update in 7 days. Update sooner if needed.

## 2024-05-15 ENCOUNTER — Encounter: Payer: Self-pay | Admitting: Medical

## 2024-05-15 MED ORDER — FLUCONAZOLE 150 MG PO TABS: ORAL_TABLET | ORAL | 0 refills | Status: AC

## 2024-05-15 NOTE — Addendum Note (Signed)
 Addended by: DORINA DALLAS HERO on: 05/15/2024 10:25 AM   Modules accepted: Orders

## 2024-05-31 ENCOUNTER — Encounter: Payer: Self-pay | Admitting: Advanced Practice Midwife

## 2024-06-11 ENCOUNTER — Other Ambulatory Visit: Payer: Self-pay | Admitting: Medical

## 2024-07-24 ENCOUNTER — Ambulatory Visit: Admitting: Physician Assistant

## 2024-07-24 ENCOUNTER — Other Ambulatory Visit (INDEPENDENT_AMBULATORY_CARE_PROVIDER_SITE_OTHER): Payer: Self-pay

## 2024-07-24 DIAGNOSIS — Z96651 Presence of right artificial knee joint: Secondary | ICD-10-CM

## 2024-07-24 NOTE — Progress Notes (Signed)
 Post-Op Visit Note   Patient: Craig House           Date of Birth: 03/30/1961           MRN: [id number] Visit Date: 07/24/2024 PCP: Dorina Loving, PA-C   Assessment & Plan:  Chief Complaint:  Chief Complaint  Patient presents with   Right Knee - Follow-up    Right total knee replacement 01/29/2024   Visit Diagnoses:  1. Status post total right knee replacement     Plan: Patient is a pleasant 63 year old gentleman who comes in today approximately 6 months status post right total knee replacement, date of surgery 01/29/2024.  He has been doing very well.  He continues to work on exercises at home as he has not been able to fully regain full extension.  Examination of his right knee reveals range of motion from 5 degrees to 115 degrees.  He is stable to valgus varus stress.  He is neurovascular intact distally.  I am able to actively get him to about 0 degrees of extension.  At this point, he will continue to work on extension and flexion exercises.  Dental prophylaxis reinforced.  Follow-up in 6 months for repeat evaluation and 2 view x-rays of the right knee.  Call with concerns or questions.  Follow-Up Instructions: Return in about 6 months (around 01/21/2025).   Orders:  Orders Placed This Encounter  Procedures   XR Knee 1-2 Views Right   No orders of the defined types were placed in this encounter.   Imaging: XR Knee 1-2 Views Right Result Date: 07/24/2024 Well-seated prosthesis without complication   PMFS History: Patient Active Problem List   Diagnosis Date Noted   Status post total right knee replacement 01/29/2024   Primary osteoarthritis of right knee 12/26/2023   Status post total left knee replacement 01/09/2023   Bilateral primary osteoarthritis of knee 06/19/2018   Mild intermittent asthma, uncomplicated 01/18/2016   HTN (hypertension) 12/31/2011   Past Medical History:  Diagnosis Date   Allergy    spring   Anemia    Arthritis    Asthma     Diabetes mellitus without complication (HCC)    type II   Hyperplastic colon polyp    Hypertension    Obesity     Family History  Problem Relation Age of Onset   Heart attack Mother    Cancer Father    Heart disease Father    Colon cancer Neg Hx    Colon polyps Neg Hx    Esophageal cancer Neg Hx    Prostate cancer Neg Hx    Rectal cancer Neg Hx     Past Surgical History:  Procedure Laterality Date   KNEE ARTHROSCOPY Bilateral    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   Procedure: LEFT TOTAL KNEE ARTHROPLASTY;  Surgeon: Jerri Kay HERO, MD;  Location: [facility] OR;  Service: Orthopedics;  Laterality: Left;   TOTAL KNEE ARTHROPLASTY Right 01/29/2024   Procedure: RIGHT TOTAL KNEE ARTHROPLASTY;  Surgeon: Jerri Kay HERO, MD;  Location: [facility] OR;  Service: Orthopedics;  Laterality: Right;   Social History   Occupational History   Not on file  Tobacco Use   Smoking status: Former   Smokeless tobacco: Never  Vaping Use   Vaping status: Never Used  Substance and Sexual Activity   Alcohol use: Yes    Alcohol/week: 1.0 standard drink of alcohol    Types: 1 Standard drinks or equivalent per week    Comment: 1  glass of red wine at night   Drug use: No   Sexual activity: Yes

## 2024-07-27 ENCOUNTER — Other Ambulatory Visit: Payer: Self-pay | Admitting: Medical

## 2024-09-03 ENCOUNTER — Other Ambulatory Visit: Payer: Self-pay | Admitting: Medical

## 2024-09-05 ENCOUNTER — Encounter: Payer: Self-pay | Admitting: Medical

## 2024-09-05 MED ORDER — OZEMPIC (0.25 OR 0.5 MG/DOSE) 2 MG/3ML ~~LOC~~ SOPN: 0.5000 mg | PEN_INJECTOR | SUBCUTANEOUS | 3 refills | Status: DC

## 2024-09-12 ENCOUNTER — Other Ambulatory Visit: Payer: Self-pay | Admitting: Medical

## 2024-09-16 ENCOUNTER — Encounter: Payer: Self-pay | Admitting: Radiology

## 2024-10-14 ENCOUNTER — Telehealth: Payer: Self-pay

## 2024-10-14 ENCOUNTER — Other Ambulatory Visit: Payer: Self-pay

## 2024-10-14 ENCOUNTER — Other Ambulatory Visit (HOSPITAL_BASED_OUTPATIENT_CLINIC_OR_DEPARTMENT_OTHER): Payer: Self-pay

## 2024-10-14 ENCOUNTER — Telehealth: Payer: Self-pay | Admitting: Pharmacy Technician

## 2024-10-14 ENCOUNTER — Other Ambulatory Visit (HOSPITAL_COMMUNITY): Payer: Self-pay

## 2024-10-14 MED ORDER — OZEMPIC (0.25 OR 0.5 MG/DOSE) 2 MG/3ML ~~LOC~~ SOPN
0.5000 mg | PEN_INJECTOR | SUBCUTANEOUS | 3 refills | Status: AC
  Filled 2024-10-14: qty 3, 28d supply, fill #0
  Filled 2024-11-15 – 2024-11-25 (×2): qty 3, 28d supply, fill #1
  Filled 2024-12-18: qty 3, 28d supply, fill #2

## 2024-10-14 NOTE — Telephone Encounter (Signed)
 Ozempic  sent downstairs.

## 2024-10-14 NOTE — Telephone Encounter (Signed)
 Pharmacy Patient Advocate Encounter  Received notification from [employer] that Prior Authorization for Ozempic  (0.25 or 0.5 MG/DOSE) 2MG /3ML pen-injectors  has been APPROVED from 09/14/24 to 10/14/25   PA #/Case ID/Reference #: [id number]

## 2024-10-14 NOTE — Telephone Encounter (Signed)
 Called pt to clarify if he needs a refill on ozempic  through crm stating he wanted the rx transferred  . Pt states he does not need a refill at this time and it is for future references . He says he will let us  know through mychart when he is ready. I advised him to request as well where he wants the rx sent

## 2024-10-14 NOTE — Telephone Encounter (Signed)
 Pharmacy Patient Advocate Encounter   Received notification from [employer] that prior authorization for Ozempic  (0.25 or 0.5 MG/DOSE) 2MG /3ML pen-injectors  is required/requested.   Insurance verification completed.   The patient is insured through [employer].   Per test claim: PA required; PA submitted to above mentioned insurance via Latent Key/confirmation #/EOC [id number] Status is pending

## 2024-10-14 NOTE — Telephone Encounter (Signed)
 Copied from CRM #[id number]. Topic: Clinical - Prescription Issue >> Oct 14, 2024  2:00 PM Aleatha C wrote: Reason for CRM: Patient would like his ozempic  to be transfer to  [facility] [address], [place], [place] [address] Open  Closes 7 PM  More hours [phone]

## 2024-10-28 ENCOUNTER — Other Ambulatory Visit: Payer: Self-pay | Admitting: Medical

## 2024-10-29 ENCOUNTER — Encounter: Payer: Self-pay | Admitting: Medical

## 2024-11-01 ENCOUNTER — Encounter: Payer: Self-pay | Admitting: Medical

## 2024-11-01 ENCOUNTER — Ambulatory Visit: Admitting: Medical

## 2024-11-01 ENCOUNTER — Other Ambulatory Visit (HOSPITAL_BASED_OUTPATIENT_CLINIC_OR_DEPARTMENT_OTHER): Payer: Self-pay

## 2024-11-01 ENCOUNTER — Telehealth: Payer: Self-pay

## 2024-11-01 VITALS — BP 120/78 | HR 68 | Temp 98.4°F | Resp 15 | Ht 67.0 in | Wt 193.6 lb

## 2024-11-01 DIAGNOSIS — R21 Rash and other nonspecific skin eruption: Secondary | ICD-10-CM

## 2024-11-01 DIAGNOSIS — I10 Essential (primary) hypertension: Secondary | ICD-10-CM | POA: Diagnosis not present

## 2024-11-01 DIAGNOSIS — Z7984 Long term (current) use of oral hypoglycemic drugs: Secondary | ICD-10-CM

## 2024-11-01 DIAGNOSIS — Z23 Encounter for immunization: Secondary | ICD-10-CM

## 2024-11-01 DIAGNOSIS — Z7985 Long-term (current) use of injectable non-insulin antidiabetic drugs: Secondary | ICD-10-CM | POA: Diagnosis not present

## 2024-11-01 DIAGNOSIS — E119 Type 2 diabetes mellitus without complications: Secondary | ICD-10-CM | POA: Diagnosis not present

## 2024-11-01 DIAGNOSIS — E785 Hyperlipidemia, unspecified: Secondary | ICD-10-CM | POA: Diagnosis not present

## 2024-11-01 DIAGNOSIS — Z1159 Encounter for screening for other viral diseases: Secondary | ICD-10-CM

## 2024-11-01 LAB — LIPID PANEL
Cholesterol: 157 mg/dL (ref 28–200)
HDL: 44.6 mg/dL
LDL Cholesterol: 86 mg/dL (ref 10–99)
NonHDL: 112.29
Total CHOL/HDL Ratio: 4
Triglycerides: 133 mg/dL (ref 10.0–149.0)
VLDL: 26.6 mg/dL (ref 0.0–40.0)

## 2024-11-01 LAB — COMPREHENSIVE METABOLIC PANEL WITH GFR
ALT: 41 U/L (ref 3–53)
AST: 21 U/L (ref 5–37)
Albumin: 4.7 g/dL (ref 3.5–5.2)
Alkaline Phosphatase: 51 U/L (ref 39–117)
BUN: 27 mg/dL — ABNORMAL HIGH (ref 6–23)
CO2: 31 meq/L (ref 19–32)
Calcium: 10.3 mg/dL (ref 8.4–10.5)
Chloride: 99 meq/L (ref 96–112)
Creatinine, Ser: 1.24 mg/dL (ref 0.40–1.50)
GFR: 61.24 mL/min
Glucose, Bld: 149 mg/dL — ABNORMAL HIGH (ref 70–99)
Potassium: 4.1 meq/L (ref 3.5–5.1)
Sodium: 138 meq/L (ref 135–145)
Total Bilirubin: 0.7 mg/dL (ref 0.2–1.2)
Total Protein: 8.2 g/dL (ref 6.0–8.3)

## 2024-11-01 LAB — HEMOGLOBIN A1C: Hgb A1c MFr Bld: 6.6 % — ABNORMAL HIGH (ref 4.6–6.5)

## 2024-11-01 LAB — MICROALBUMIN / CREATININE URINE RATIO
Creatinine,U: 164.8 mg/dL
Microalb Creat Ratio: 15.6 mg/g (ref 0.0–30.0)
Microalb, Ur: 2.6 mg/dL — ABNORMAL HIGH (ref 0.7–1.9)

## 2024-11-01 MED ORDER — NYSTATIN 100000 UNIT/GM EX CREA
1.0000 | TOPICAL_CREAM | Freq: Two times a day (BID) | CUTANEOUS | 0 refills | Status: AC
  Filled 2024-11-01: qty 15, 30d supply, fill #0
  Filled 2024-12-18: qty 15, 30d supply, fill #1

## 2024-11-01 MED ORDER — METFORMIN HCL 500 MG PO TABS
500.0000 mg | ORAL_TABLET | Freq: Two times a day (BID) | ORAL | 3 refills | Status: AC
  Filled 2024-11-01: qty 180, 90d supply, fill #0

## 2024-11-01 MED ORDER — AMLODIPINE BESYLATE 10 MG PO TABS
10.0000 mg | ORAL_TABLET | Freq: Every day | ORAL | 3 refills | Status: AC
  Filled 2024-11-01 – 2024-12-06 (×2): qty 90, 90d supply, fill #0

## 2024-11-01 MED ORDER — TRIAMTERENE-HCTZ 37.5-25 MG PO CAPS
1.0000 | ORAL_CAPSULE | Freq: Every morning | ORAL | 3 refills | Status: DC
  Filled 2024-11-01: qty 90, 90d supply, fill #0

## 2024-11-01 MED ORDER — ATORVASTATIN CALCIUM 20 MG PO TABS
20.0000 mg | ORAL_TABLET | Freq: Every day | ORAL | 3 refills | Status: AC
  Filled 2024-11-01 – 2024-12-06 (×2): qty 90, 90d supply, fill #0

## 2024-11-01 NOTE — Telephone Encounter (Signed)
 Pt came in to pick up papers and gave them back stating he didn't want them since it wasn't for 5 years.

## 2024-11-01 NOTE — Patient Instructions (Addendum)
 Type 2 diabetes mellitus A1c at 6.2%. Discussed pancreatitis risk with current Ozempic  dose.(though think minimal) - Refilled metformin  500 mg BID for three months with three refills. - Continue Ozempic  0.5 mg weekly. - Ordered A1c test.  Essential hypertension Blood pressure controlled on current regimen. - Refilled amlodipine  10 mg daily, 90 tablets with three refills. - Refilled triamterene  HCTZ 37.5-25 mg, 90 tablets with three refills.  Hyperlipidemia Due for lipid panel. - Refilled atorvastatin  20 mg, 90 tablets with three refills. - Ordered lipid panel.  Recurrent dermatitis and dyshidrotic eczema Recurrent dermatitis and dyshidrotic eczema on hands and groin. Discussed fungal involvement and potential biologics. - Refilled triamcinolone  cream for hands. -let me know where you went last and will place new referral/second opinion  Intermittent fungal infection of groin - Prescribed nystatin cream for groin area. - Advised to contact via MyChart if nystatin is ineffective after 3-5 daysthen my chart me for  Diflucan  prescription. - Consider referral to dermatologist if condition worsens.  General health maintenance Due for pneumonia vaccine. Discussed hepatitis C screening. - Will administer pneumonia PCV20 vaccine in February. - Ordered hepatitis C antibody test. -flu vaccine today.  Knee pain. Hx of and has seen ortho and had surgery(he came back just before 1pm asking me fill out  parking placard form quickly. -filled out form/placard for 6 months for his knee pain/foot pain and discuss further on wellness exam in late march -pt came back and picked up form but wanted 5 years. I was seeing patient and could not go thru notes investigating severity of his pain, xray office notes etc to give 5 years. Also was with patients.  -advised staff to tell patient giving 6 months on wellness exam disucss further and then can fill out 5 years if indicated. He did not accept 6 month  form and gave back to staff.   Follow up 3-6 months or sooner if needed.

## 2024-11-01 NOTE — Progress Notes (Addendum)
 "  Subjective:    Patient ID: Craig House, male    DOB: [birth year], 63 y.o.   MRN: [id number]  HPI   Pt last AVS 6 months ago   Allergic Contact Dermatitis with Secondary Folliculitis Symmetric pruritic rash with secondary folliculitis due to scratching. Possible external allergen contact. Inflamed follicles suggest potential skin infection. - Prescribed doxycycline  for potential skin infection and folliculitis, advised administration with food, cautioned about photosensitivity. - Prescribed Medrol  dose pack for allergic reaction, monitored blood glucose due to hyperglycemia risk. - Prescribed antihistamine for pruritus, 10 mg every 8 hours as needed, option to double dose at night. - Advised follow-up in 7-10 days to assess improvement.   Type 2 Diabetes Mellitus Diabetes management affected by recent knee surgery and medication adjustments. A1c was 7.5% in February. Morning blood glucose levels in the 150s mg/dL. Resumed Ozempic  post-surgery, continues metformin . Concern for hyperglycemia due to Medrol . - Ordered A1c and metabolic panel to evaluate current diabetes control. - Continued Ozempic  and metformin  for diabetes management. - Utilized sliding scale insulin  for blood glucose exceeding 200 mg/dL, particularly during Medrol  therapy.   Follow up 7-10 days or sooner if needed     Craig House is a 63 year old male with hypertension and diabetes who presents for medication refills and management of skin issues.  He takes amlodipine  10 mg daily and triamterene  HCTZ 37.5/25 mg for hypertension. His home blood pressure was 120/78 mmHg this morning. He has frequent urination that he attributes to the diuretic.  For diabetes, he takes metformin  500 mg twice daily. He had been using Ozempic  0.5 mg but has been without it for about a month due to pharmacy issues. He previously had constipation with Ozempic  and uses prune juice. He usually takes his medications after  lunch to reduce stomach irritation.  He takes atorvastatin  20 mg for hyperlipidemia.  He has recurrent skin problems with hard, peeling, lumpy and bumpy areas on his hands. He uses triamcinolone  cream on his hands. He also has a groin rash and has used nystatin  cream and previously Diflucan  for severe fungal irritation. He is concerned about recurrent flare-ups and has read about eczema and psoriasis.  Pt also request I fill out his parking placard. Fill out in the past for knee pain(brought back form after exam done so had review chart and look for qualifying reason. Pt had prior surgeries and some knee pain persists.    Review of Systems See hpi    Objective:   Physical Exam  General Mental Status- Alert. General Appearance- Not in acute distress.   Skin General: Color- Normal Color. Moisture- Normal Moisture.  Neck Carotid Arteries- Normal color. Moisture- Normal Moisture. No carotid bruits. No JVD.  Chest and Lung Exam Auscultation: Breath Sounds:-Normal.  Cardiovascular Auscultation:Rythm- Regular. Murmurs & Other Heart Sounds:Auscultation of the heart reveals- No Murmurs.  Abdomen Inspection:-Inspeection Normal. Palpation/Percussion:Note:No mass. Palpation and Percussion of the abdomen reveal- Non Tender, Non Distended + BS, no rebound or guarding.   Neurologic Cranial Nerve exam:- CN III-XII intact(No nystagmus), symmetric smile. Strength:- 5/5 equal and symmetric strength both upper and lower extremities.       Assessment & Plan:   Type 2 diabetes mellitus A1c at 6.2%. Discussed pancreatitis risk with current Ozempic  dose.(though think minimal) - Refilled metformin  500 mg BID for three months with three refills. - Continue Ozempic  0.5 mg weekly. - Ordered A1c test.  Essential hypertension Blood pressure controlled on current regimen. - Refilled amlodipine  10  mg daily, 90 tablets with three refills. - Refilled triamterene  HCTZ 37.5-25 mg, 90 tablets with  three refills.  Hyperlipidemia Due for lipid panel. - Refilled atorvastatin  20 mg, 90 tablets with three refills. - Ordered lipid panel.  Recurrent dermatitis and dyshidrotic eczema Recurrent dermatitis and dyshidrotic eczema on hands and groin. Discussed fungal involvement and potential biologics. - Refilled triamcinolone  cream for hands.  Intermittent fungal infection of groin - Prescribed nystatin  cream for groin area. - Advised to contact via MyChart if nystatin  is ineffective after 3-5 daysthen my chart me for  Diflucan  prescription. - Consider referral to dermatologist if condition worsens.  General health maintenance Due for pneumonia vaccine. Discussed hepatitis C screening. - Will administer pneumonia PCV20 vaccine in February. - Ordered hepatitis C antibody test. -flu vaccine today.   Knee pain. Hx of and has seen ortho and had surgery(he came back just before 1pm asking me fill out  parking placard form quickly. -filled out form/placard for 6 months for his knee pain/foot pain and discuss further on wellness exam in late march -pt came back and picked up form but wanted 5 years. I was seeing patient and could not go thru notes investigating severity of his pain, xray office notes etc to give 5 years. Also was with patients.  -advised staff to tell patient giving 6 months on wellness exam disucss further and then can fill out 5 years if indicated. He did not accept 6 month form and gave back to staff.    Follow up 3-6 months or sooner if needed.   I personally spent a total of 42 minutes in the care of the patient today including performing a medically appropriate exam/evaluation, counseling and educating, placing orders, and documenting clinical information in the EHR.  Dallas Maxwell, PA-C  "

## 2024-11-01 NOTE — Telephone Encounter (Signed)
 Called pt notified him that the parking placard is ready will be in front office

## 2024-11-02 ENCOUNTER — Ambulatory Visit: Payer: Self-pay | Admitting: Medical

## 2024-11-02 LAB — HEPATITIS C ANTIBODY: Hepatitis C Ab: NONREACTIVE

## 2024-11-04 NOTE — Progress Notes (Signed)
 Last read by Cleatus Camellia Viktoria Camellia at 8:32AM on 11/03/2024.

## 2024-11-15 ENCOUNTER — Other Ambulatory Visit (HOSPITAL_BASED_OUTPATIENT_CLINIC_OR_DEPARTMENT_OTHER): Payer: Self-pay

## 2024-11-20 ENCOUNTER — Other Ambulatory Visit (HOSPITAL_BASED_OUTPATIENT_CLINIC_OR_DEPARTMENT_OTHER): Payer: Self-pay

## 2024-11-22 ENCOUNTER — Other Ambulatory Visit (HOSPITAL_BASED_OUTPATIENT_CLINIC_OR_DEPARTMENT_OTHER): Payer: Self-pay

## 2024-11-25 ENCOUNTER — Other Ambulatory Visit (HOSPITAL_BASED_OUTPATIENT_CLINIC_OR_DEPARTMENT_OTHER): Payer: Self-pay

## 2024-11-27 ENCOUNTER — Other Ambulatory Visit: Payer: Self-pay | Admitting: Medical

## 2024-12-06 ENCOUNTER — Other Ambulatory Visit (HOSPITAL_BASED_OUTPATIENT_CLINIC_OR_DEPARTMENT_OTHER): Payer: Self-pay

## 2024-12-18 ENCOUNTER — Other Ambulatory Visit: Payer: Self-pay

## 2025-01-22 ENCOUNTER — Ambulatory Visit: Admitting: Physician Assistant

## 2025-01-31 ENCOUNTER — Encounter: Admitting: Medical
